# Patient Record
Sex: Female | Born: 1940 | Race: White | Hispanic: No | Smoking: Never smoker
Health system: Southern US, Community
[De-identification: ages and names within clinical notes are randomized; demographics above are authoritative.]

## PROBLEM LIST (undated history)

## (undated) DIAGNOSIS — F32A Depression, unspecified: Secondary | ICD-10-CM

## (undated) DIAGNOSIS — M1990 Unspecified osteoarthritis, unspecified site: Secondary | ICD-10-CM

## (undated) DIAGNOSIS — I509 Heart failure, unspecified: Secondary | ICD-10-CM

## (undated) DIAGNOSIS — F329 Major depressive disorder, single episode, unspecified: Secondary | ICD-10-CM

## (undated) DIAGNOSIS — R7881 Bacteremia: Secondary | ICD-10-CM

## (undated) DIAGNOSIS — N289 Disorder of kidney and ureter, unspecified: Secondary | ICD-10-CM

---

## 2009-12-25 ENCOUNTER — Ambulatory Visit: Payer: Self-pay | Admitting: Family Medicine

## 2010-10-08 ENCOUNTER — Inpatient Hospital Stay: Payer: Self-pay | Admitting: Internal Medicine

## 2016-04-30 ENCOUNTER — Ambulatory Visit (HOSPITAL_COMMUNITY)
Admission: AD | Admit: 2016-04-30 | Discharge: 2016-04-30 | Disposition: A | Discharge status: Home / Self Care | Payer: Medicare Other | Source: Other Acute Inpatient Hospital | Attending: Emergency Medicine | Admitting: Emergency Medicine

## 2016-04-30 ENCOUNTER — Emergency Department: Payer: Medicare Other

## 2016-04-30 ENCOUNTER — Encounter: Payer: Self-pay | Admitting: *Deleted

## 2016-04-30 ENCOUNTER — Emergency Department
Admission: EM | Admit: 2016-04-30 | Discharge: 2016-04-30 | Disposition: A | Discharge status: Other Acute Inpatient Hospital | Payer: Medicare Other | Attending: Emergency Medicine | Admitting: Emergency Medicine

## 2016-04-30 DIAGNOSIS — Z79899 Other long term (current) drug therapy: Secondary | ICD-10-CM | POA: Insufficient documentation

## 2016-04-30 DIAGNOSIS — I509 Heart failure, unspecified: Secondary | ICD-10-CM | POA: Insufficient documentation

## 2016-04-30 DIAGNOSIS — K222 Esophageal obstruction: Secondary | ICD-10-CM | POA: Insufficient documentation

## 2016-04-30 DIAGNOSIS — R1310 Dysphagia, unspecified: Secondary | ICD-10-CM | POA: Diagnosis present

## 2016-04-30 DIAGNOSIS — Z7982 Long term (current) use of aspirin: Secondary | ICD-10-CM | POA: Insufficient documentation

## 2016-04-30 HISTORY — DX: Depression, unspecified: F32.A

## 2016-04-30 HISTORY — DX: Unspecified osteoarthritis, unspecified site: M19.90

## 2016-04-30 HISTORY — DX: Disorder of kidney and ureter, unspecified: N28.9

## 2016-04-30 HISTORY — DX: Heart failure, unspecified: I50.9

## 2016-04-30 HISTORY — DX: Major depressive disorder, single episode, unspecified: F32.9

## 2016-04-30 LAB — CBC WITH DIFFERENTIAL/PLATELET
BASOS PCT: 1 %
Basophils Absolute: 0 10*3/uL (ref 0–0.1)
Eosinophils Absolute: 0 10*3/uL (ref 0–0.7)
Eosinophils Relative: 1 %
HCT: 42.1 % (ref 35.0–47.0)
HEMOGLOBIN: 14.5 g/dL (ref 12.0–16.0)
LYMPHS ABS: 0.6 10*3/uL — AB (ref 1.0–3.6)
LYMPHS PCT: 14 %
MCH: 30 pg (ref 26.0–34.0)
MCHC: 34.6 g/dL (ref 32.0–36.0)
MCV: 86.9 fL (ref 80.0–100.0)
MONO ABS: 0.6 10*3/uL (ref 0.2–0.9)
MONOS PCT: 14 %
NEUTROS ABS: 3.2 10*3/uL (ref 1.4–6.5)
NEUTROS PCT: 70 %
Platelets: 286 10*3/uL (ref 150–440)
RBC: 4.85 MIL/uL (ref 3.80–5.20)
RDW: 13.8 % (ref 11.5–14.5)
WBC: 4.6 10*3/uL (ref 3.6–11.0)

## 2016-04-30 LAB — COMPREHENSIVE METABOLIC PANEL
ALBUMIN: 4.3 g/dL (ref 3.5–5.0)
ALT: 11 U/L — ABNORMAL LOW (ref 14–54)
ANION GAP: 18 — AB (ref 5–15)
AST: 26 U/L (ref 15–41)
Alkaline Phosphatase: 62 U/L (ref 38–126)
BILIRUBIN TOTAL: 1.7 mg/dL — AB (ref 0.3–1.2)
BUN: 22 mg/dL — AB (ref 6–20)
CO2: 22 mmol/L (ref 22–32)
Calcium: 8.9 mg/dL (ref 8.9–10.3)
Chloride: 101 mmol/L (ref 101–111)
Creatinine, Ser: 0.84 mg/dL (ref 0.44–1.00)
GFR calc Af Amer: >60 mL/min (ref >60)
GFR calc non Af Amer: >60 mL/min (ref >60)
GLUCOSE: 95 mg/dL (ref 65–99)
POTASSIUM: 3.1 mmol/L — AB (ref 3.5–5.1)
SODIUM: 141 mmol/L (ref 135–145)
TOTAL PROTEIN: 8.9 g/dL — AB (ref 6.5–8.1)

## 2016-04-30 LAB — URINALYSIS, COMPLETE (UACMP) WITH MICROSCOPIC
BACTERIA UA: NONE SEEN
Bilirubin Urine: NEGATIVE
GLUCOSE, UA: NEGATIVE mg/dL
KETONES UR: 80 mg/dL — AB
Leukocytes, UA: NEGATIVE
Nitrite: NEGATIVE
PROTEIN: 30 mg/dL — AB
Specific Gravity, Urine: 1.02 (ref 1.005–1.030)
pH: 5 (ref 5.0–8.0)

## 2016-04-30 LAB — BETA-HYDROXYBUTYRIC ACID: BETA-HYDROXYBUTYRIC ACID: 6.55 mmol/L — AB (ref 0.05–0.27)

## 2016-04-30 LAB — LACTIC ACID, PLASMA: Lactic Acid, Venous: 1.5 mmol/L (ref 0.5–1.9)

## 2016-04-30 MED ORDER — SODIUM CHLORIDE 0.9 % IV BOLUS (SEPSIS)
1000.0000 mL | Freq: Once | INTRAVENOUS | Status: AC
  Administered 2016-04-30: 1000 mL via INTRAVENOUS

## 2016-04-30 NOTE — ED Notes (Signed)
Patient transported to X-ray 

## 2016-04-30 NOTE — ED Triage Notes (Signed)
Pt states she ate some popcorn on 1/10 and states she felt like it got stuck in her throat, states she was since seen at [facility] and given abx and fluids, states she continues to have trouble swallowing, states she has increased burping

## 2016-04-30 NOTE — ED Provider Notes (Signed)
[facility] Emergency Department Provider Note   ____________________________________________   First MD Initiated Contact with Patient 04/30/16 [id number]     (approximate)  I have reviewed the triage vital signs and the nursing notes.   HISTORY  Chief Complaint Dysphagia    HPI Rut Betterton is a 76 y.o. female who reports she has not cracker esophagus. She reports she would usually only has problems for 2 or 3 days couple times a year. This time however she says she has had difficulty swallowing since the 10th of the month. She has lost 9 pounds possibly 10 pounds was very weak and dehydrated and feels like she is weak wasting away. Patient says that this is her usual episode of protracted esophagus just that it has not resolved like it usually does.   Past Medical History:  Diagnosis Date  . Arthritis   . CHF (congestive heart failure) ([place])   . Depressed   . Renal disorder     There are no active problems to display for this patient.   History reviewed. No pertinent surgical history.  Prior to Admission medications   Medication Sig Start Date End Date Taking? Authorizing Provider  acetaminophen (TYLENOL) 500 MG tablet Take 500 mg by mouth 2 (two) times daily. 10/24/09  Yes Historical Provider, MD  aspirin EC 81 MG tablet Take 81 mg by mouth daily.   Yes Historical Provider, MD  atorvastatin (LIPITOR) 20 MG tablet Take 20 mg by mouth daily. 10/27/13  Yes Historical Provider, MD  cetirizine (ZYRTEC) 10 MG tablet Take 10 mg by mouth daily.   Yes Historical Provider, MD  Cholecalciferol (VITAMIN D-1000 MAX ST) 1000 units tablet Take 1,000 mg by mouth daily. 06/21/15  Yes Historical Provider, MD  conjugated estrogens (PREMARIN) vaginal cream Place 1 Applicatorful vaginally at bedtime. Do not use applicator. Apply pea sized application to the urethra each night before bedtime. 11/30/13  Yes Historical Provider, MD  Dextromethorphan-Guaifenesin (TUSSIN DM)  10-100 MG/5ML liquid Take 5 mLs by mouth every 6 (six) hours as needed.   Yes Historical Provider, MD  ferrous sulfate 325 (65 FE) MG tablet Take 325 mg by mouth 2 (two) times daily.   Yes Historical Provider, MD  guaiFENesin (MUCINEX) 600 MG 12 hr tablet Take 600 mg by mouth 2 (two) times daily as needed.   Yes Historical Provider, MD  HYDROcodone-acetaminophen (NORCO/VICODIN) 5-325 MG tablet Take 1 tablet by mouth 2 (two) times daily. 04/09/16  Yes Historical Provider, MD  hydroxychloroquine (PLAQUENIL) 200 MG tablet Take 200 mg by mouth daily. 01/18/16  Yes Historical Provider, MD  LORazepam (ATIVAN) 1 MG tablet Take 1 mg by mouth daily.   Yes Historical Provider, MD  mirtazapine (REMERON) 30 MG tablet Take 30 mg by mouth at bedtime.   Yes Historical Provider, MD  senna-docusate (SENOKOT-S) 8.6-50 MG tablet Take 1 tablet by mouth 2 (two) times daily.   Yes Historical Provider, MD  Suvorexant (BELSOMRA) 10 MG TABS Take 10 mg by mouth daily.   Yes Historical Provider, MD  triamcinolone cream (KENALOG) 0.1 % Apply 0.1 % topically 2 (two) times daily as needed.   Yes Historical Provider, MD    Allergies Patient has no known allergies.  No family history on file.  Social History Social History  Substance Use Topics  . Smoking status: Never Smoker  . Smokeless tobacco: Never Used  . Alcohol use No    Review of Systems Constitutional: No fever/chills Eyes: No visual changes. ENT: No  sore throat. Cardiovascular: Denies chest pain. Respiratory: Denies shortness of breath. Gastrointestinal: No abdominal pain.  No nausea, no vomiting.  No diarrhea.  No constipation. Genitourinary: Negative for dysuria. Musculoskeletal: Negative for back pain. Skin: Negative for rash. Neurological: Negative for headaches, focal weakness or numbness.  10-point ROS otherwise negative.  ____________________________________________   PHYSICAL EXAM:  VITAL SIGNS: ED Triage Vitals  Enc Vitals Group      BP 04/30/16 0908 127/72     Pulse Rate 04/30/16 0908 (!) 121     Resp 04/30/16 0908 18     Temp 04/30/16 0908 97.7 F (36.5 C)     Temp Source 04/30/16 0908 Oral     SpO2 04/30/16 0908 96 %     Weight 04/30/16 0909 110 lb (49.9 kg)     Height 04/30/16 0909 '4\' 10"'$  (1.473 m)     Head Circumference --      Peak Flow --      Pain Score --      Pain Loc --      Pain Edu? --      Excl. in [place]? --     Constitutional: Alert and oriented. Well appearing and in no acute distress. Eyes: Conjunctivae are normal. PERRL. EOMI. Head: Atraumatic. Nose: No congestion/rhinnorhea. Mouth/Throat: Mucous membranes are moist.  Oropharynx non-erythematous. Neck: No stridor.   Cardiovascular: Normal rate, regular rhythm. Grossly normal heart sounds.  Good peripheral circulation. Respiratory: Normal respiratory effort.  No retractions. Lungs CTAB. Gastrointestinal: Soft and nontender. No distention. No abdominal bruits. No CVA tenderness.   ____________________________________________   LABS (all labs ordered are listed, but only abnormal results are displayed)  Labs Reviewed  COMPREHENSIVE METABOLIC PANEL - Abnormal; Notable for the following:       Result Value   Potassium 3.1 (*)    BUN 22 (*)    Total Protein 8.9 (*)    ALT 11 (*)    Total Bilirubin 1.7 (*)    Anion gap 18 (*)    All other components within normal limits  CBC WITH DIFFERENTIAL/PLATELET - Abnormal; Notable for the following:    Lymphs Abs 0.6 (*)    All other components within normal limits  LACTIC ACID, PLASMA  URINALYSIS, COMPLETE (UACMP) WITH MICROSCOPIC  BETA-HYDROXYBUTYRIC ACID   ____________________________________________  EKG EKG read and interpreted by me shows sinus tachycardia rate of 101 normal axis nonspecific ST-T wave changes there is right bundle-branch block  ____________________________________________  RADIOLOGY  udy Result   CLINICAL DATA:  Productive cough.  EXAM: CHEST  2  VIEW  COMPARISON:  No recent prior.  FINDINGS: Mediastinum and hilar structures normal. Heart size normal. No focal infiltrate. Hyperexpansion of both lung fields consistent with COPD. Bilateral pleural-parenchymal thickening noted consistent with scarring. Bilateral nipple shadows noted. No pleural effusion or pneumothorax.  IMPRESSION: COPD. Bilateral pleural-parenchymal scarring. No acute abnormality.   Electronically Signed   By: Marcello Moores  Register   On: 04/30/2016 10:38    Study Result   CLINICAL DATA:  The patient reports eating popcorn on January 10th with if becoming stuck in her throat with persistent irritation. The patient has had difficulty swallowing anything solid for the past 6 days and has had vomiting. The patient reports 8 pound weight loss since beginning this. The patient is only able D pops ankles.  EXAM: ESOPHOGRAM/BARIUM SWALLOW  TECHNIQUE: Single contrast examination was performed using  thin barium  FLUOROSCOPY TIME:  Fluoroscopy Time:  0 minutes, 42 seconds  Radiation Exposure Index (  if provided by the fluoroscopic device): 497 micro Gy per meter squared  Number of Acquired Spot Images: 5+ 1 video loop  COMPARISON:  None in PACs  FINDINGS: The cervical and proximal and mid thoracic esophagus were normal in a survey fashion. A filling defect was noted in the distal esophagus just above a small hiatal hernia. A small amount of barium was able to pass around this filling defect. This filling defect did not appear to move significantly.  IMPRESSION: Filling defect in the distal esophagus which is obstructive to passage of more than small amounts of the thin barium. This may reflect a retained food particle or could reflect a pedunculated polyp either benign or malignant.  Direct visualization is recommended.   Electronically Signed   By: David  Martinique M.D.   On: 04/30/2016 11:40     ____________________________________________   PROCEDURES  Procedure(s) performed: Procedures  Critical Care performed:  Critical care time 45 minutes this includes discussing the patient with the radiologist before and after the barium swallow and attempting to transfer the patient several times because of the lack of beds. ____________________________________________   INITIAL IMPRESSION / ASSESSMENT AND PLAN / ED COURSE  Pertinent labs & imaging results that were available during my care of the patient were reviewed by me and considered in my medical decision making (see chart for details).  We do not have GI coverage. [facility] does not have any beds. [facility] does not have any beds either but will take her into the emergency room.      ____________________________________________   FINAL CLINICAL IMPRESSION(S) / ED DIAGNOSES  Final diagnoses:  Esophageal obstruction      NEW MEDICATIONS STARTED DURING THIS VISIT:  New Prescriptions   No medications on file     Note:  This document was prepared using Dragon voice recognition software and may include unintentional dictation errors.    Nena Polio, MD 04/30/16 1247

## 2017-05-12 ENCOUNTER — Ambulatory Visit
Admission: RE | Admit: 2017-05-12 | Discharge: 2017-05-12 | Disposition: A | Discharge status: Home / Self Care | Payer: Medicare Other | Source: Ambulatory Visit | Attending: Family Medicine | Admitting: Family Medicine

## 2017-05-12 ENCOUNTER — Ambulatory Visit: Admission: RE | Admit: 2017-05-12 | Payer: Medicare Other | Source: Ambulatory Visit | Admitting: *Deleted

## 2017-05-12 ENCOUNTER — Other Ambulatory Visit: Payer: Self-pay | Admitting: Family Medicine

## 2017-05-12 DIAGNOSIS — I700 Atherosclerosis of aorta: Secondary | ICD-10-CM | POA: Insufficient documentation

## 2017-05-12 DIAGNOSIS — R05 Cough: Secondary | ICD-10-CM

## 2017-05-12 DIAGNOSIS — R059 Cough, unspecified: Secondary | ICD-10-CM

## 2017-05-12 DIAGNOSIS — M8008XA Age-related osteoporosis with current pathological fracture, vertebra(e), initial encounter for fracture: Secondary | ICD-10-CM | POA: Diagnosis not present

## 2017-05-12 DIAGNOSIS — E871 Hypo-osmolality and hyponatremia: Secondary | ICD-10-CM | POA: Diagnosis not present

## 2017-05-13 ENCOUNTER — Encounter: Payer: Self-pay | Admitting: Emergency Medicine

## 2017-05-13 ENCOUNTER — Emergency Department: Payer: Medicare Other

## 2017-05-13 ENCOUNTER — Inpatient Hospital Stay
Admission: EM | Admit: 2017-05-13 | Discharge: 2017-05-15 | DRG: 543 | Disposition: A | Discharge status: Home Health | Payer: Medicare Other | Attending: Internal Medicine | Admitting: Internal Medicine

## 2017-05-13 ENCOUNTER — Other Ambulatory Visit: Payer: Self-pay

## 2017-05-13 DIAGNOSIS — E785 Hyperlipidemia, unspecified: Secondary | ICD-10-CM | POA: Diagnosis present

## 2017-05-13 DIAGNOSIS — J45901 Unspecified asthma with (acute) exacerbation: Secondary | ICD-10-CM | POA: Diagnosis present

## 2017-05-13 DIAGNOSIS — E871 Hypo-osmolality and hyponatremia: Secondary | ICD-10-CM | POA: Diagnosis present

## 2017-05-13 DIAGNOSIS — F0390 Unspecified dementia without behavioral disturbance: Secondary | ICD-10-CM | POA: Diagnosis present

## 2017-05-13 DIAGNOSIS — E876 Hypokalemia: Secondary | ICD-10-CM | POA: Diagnosis present

## 2017-05-13 DIAGNOSIS — S42031A Displaced fracture of lateral end of right clavicle, initial encounter for closed fracture: Secondary | ICD-10-CM | POA: Diagnosis present

## 2017-05-13 DIAGNOSIS — F329 Major depressive disorder, single episode, unspecified: Secondary | ICD-10-CM | POA: Diagnosis present

## 2017-05-13 DIAGNOSIS — J101 Influenza due to other identified influenza virus with other respiratory manifestations: Secondary | ICD-10-CM | POA: Diagnosis present

## 2017-05-13 DIAGNOSIS — M069 Rheumatoid arthritis, unspecified: Secondary | ICD-10-CM | POA: Diagnosis present

## 2017-05-13 DIAGNOSIS — J40 Bronchitis, not specified as acute or chronic: Secondary | ICD-10-CM

## 2017-05-13 DIAGNOSIS — M8008XA Age-related osteoporosis with current pathological fracture, vertebra(e), initial encounter for fracture: Secondary | ICD-10-CM | POA: Diagnosis present

## 2017-05-13 DIAGNOSIS — Z79899 Other long term (current) drug therapy: Secondary | ICD-10-CM

## 2017-05-13 DIAGNOSIS — W1830XA Fall on same level, unspecified, initial encounter: Secondary | ICD-10-CM | POA: Diagnosis present

## 2017-05-13 DIAGNOSIS — S32019A Unspecified fracture of first lumbar vertebra, initial encounter for closed fracture: Secondary | ICD-10-CM | POA: Diagnosis present

## 2017-05-13 DIAGNOSIS — S32010A Wedge compression fracture of first lumbar vertebra, initial encounter for closed fracture: Secondary | ICD-10-CM

## 2017-05-13 DIAGNOSIS — E878 Other disorders of electrolyte and fluid balance, not elsewhere classified: Secondary | ICD-10-CM | POA: Diagnosis present

## 2017-05-13 DIAGNOSIS — I509 Heart failure, unspecified: Secondary | ICD-10-CM | POA: Diagnosis present

## 2017-05-13 DIAGNOSIS — Z7951 Long term (current) use of inhaled steroids: Secondary | ICD-10-CM

## 2017-05-13 DIAGNOSIS — Z7982 Long term (current) use of aspirin: Secondary | ICD-10-CM | POA: Diagnosis not present

## 2017-05-13 DIAGNOSIS — S42001A Fracture of unspecified part of right clavicle, initial encounter for closed fracture: Secondary | ICD-10-CM

## 2017-05-13 LAB — CBC WITH DIFFERENTIAL/PLATELET
Basophils Absolute: 0 10*3/uL (ref 0–0.1)
Basophils Relative: 0 %
EOS PCT: 0 %
Eosinophils Absolute: 0 10*3/uL (ref 0–0.7)
HEMATOCRIT: 41.4 % (ref 35.0–47.0)
Hemoglobin: 14 g/dL (ref 12.0–16.0)
LYMPHS ABS: 0.4 10*3/uL — AB (ref 1.0–3.6)
LYMPHS PCT: 5 %
MCH: 30.3 pg (ref 26.0–34.0)
MCHC: 33.9 g/dL (ref 32.0–36.0)
MCV: 89.5 fL (ref 80.0–100.0)
Monocytes Absolute: 0.9 10*3/uL (ref 0.2–0.9)
Monocytes Relative: 10 %
Neutro Abs: 7.8 10*3/uL — ABNORMAL HIGH (ref 1.4–6.5)
Neutrophils Relative %: 85 %
PLATELETS: 139 10*3/uL — AB (ref 150–440)
RBC: 4.63 MIL/uL (ref 3.80–5.20)
RDW: 13.4 % (ref 11.5–14.5)
WBC: 9.2 10*3/uL (ref 3.6–11.0)

## 2017-05-13 LAB — COMPREHENSIVE METABOLIC PANEL
ALK PHOS: 65 U/L (ref 38–126)
ALT: 18 U/L (ref 14–54)
AST: 45 U/L — ABNORMAL HIGH (ref 15–41)
Albumin: 3.8 g/dL (ref 3.5–5.0)
Anion gap: 13 (ref 5–15)
BUN: 12 mg/dL (ref 6–20)
CALCIUM: 8.8 mg/dL — AB (ref 8.9–10.3)
CHLORIDE: 86 mmol/L — AB (ref 101–111)
CO2: 25 mmol/L (ref 22–32)
CREATININE: 0.74 mg/dL (ref 0.44–1.00)
GFR calc non Af Amer: >60 mL/min (ref >60)
Glucose, Bld: 150 mg/dL — ABNORMAL HIGH (ref 65–99)
Potassium: 3 mmol/L — ABNORMAL LOW (ref 3.5–5.1)
Sodium: 124 mmol/L — ABNORMAL LOW (ref 135–145)
TOTAL PROTEIN: 7.9 g/dL (ref 6.5–8.1)
Total Bilirubin: 1 mg/dL (ref 0.3–1.2)

## 2017-05-13 LAB — MRSA PCR SCREENING: MRSA by PCR: POSITIVE — AB

## 2017-05-13 LAB — TSH: TSH: 0.533 u[IU]/mL (ref 0.350–4.500)

## 2017-05-13 LAB — INFLUENZA PANEL BY PCR (TYPE A & B)
INFLAPCR: POSITIVE — AB
INFLBPCR: NEGATIVE

## 2017-05-13 LAB — TROPONIN I

## 2017-05-13 MED ORDER — VITAMIN D 1000 UNITS PO TABS
1000.0000 [IU] | ORAL_TABLET | Freq: Every day | ORAL | Status: DC
  Administered 2017-05-14 – 2017-05-15 (×2): 1000 [IU] via ORAL
  Filled 2017-05-13 (×2): qty 1

## 2017-05-13 MED ORDER — IPRATROPIUM-ALBUTEROL 0.5-2.5 (3) MG/3ML IN SOLN
RESPIRATORY_TRACT | Status: AC
  Filled 2017-05-13: qty 6

## 2017-05-13 MED ORDER — BISACODYL 10 MG RE SUPP
10.0000 mg | Freq: Every day | RECTAL | Status: DC | PRN
  Administered 2017-05-15: 10 mg via RECTAL
  Filled 2017-05-13: qty 1

## 2017-05-13 MED ORDER — LORATADINE 10 MG PO TABS
10.0000 mg | ORAL_TABLET | Freq: Every day | ORAL | Status: DC
Start: 2017-05-14 — End: 2017-05-15
  Administered 2017-05-14 – 2017-05-15 (×2): 10 mg via ORAL
  Filled 2017-05-13 (×2): qty 1

## 2017-05-13 MED ORDER — IPRATROPIUM-ALBUTEROL 0.5-2.5 (3) MG/3ML IN SOLN
9.0000 mL | Freq: Once | RESPIRATORY_TRACT | Status: AC
  Administered 2017-05-13: 9 mL via RESPIRATORY_TRACT
  Filled 2017-05-13: qty 3

## 2017-05-13 MED ORDER — METHYLPREDNISOLONE SODIUM SUCC 40 MG IJ SOLR
40.0000 mg | Freq: Four times a day (QID) | INTRAMUSCULAR | Status: DC
  Administered 2017-05-13 – 2017-05-14 (×2): 40 mg via INTRAVENOUS
  Filled 2017-05-13 (×2): qty 1

## 2017-05-13 MED ORDER — MELATONIN 5 MG PO TABS
5.0000 mg | ORAL_TABLET | Freq: Every day | ORAL | Status: DC
  Administered 2017-05-13 – 2017-05-14 (×2): 5 mg via ORAL
  Filled 2017-05-13 (×3): qty 1

## 2017-05-13 MED ORDER — GUAIFENESIN ER 600 MG PO TB12
600.0000 mg | ORAL_TABLET | Freq: Two times a day (BID) | ORAL | Status: DC
  Administered 2017-05-13 – 2017-05-15 (×4): 600 mg via ORAL
  Filled 2017-05-13 (×4): qty 1

## 2017-05-13 MED ORDER — ACETAMINOPHEN 325 MG PO TABS: 650.0000 mg | ORAL_TABLET | Freq: Four times a day (QID) | ORAL | Status: DC | PRN

## 2017-05-13 MED ORDER — FLEET ENEMA 7-19 GM/118ML RE ENEM: 1.0000 | ENEMA | Freq: Once | RECTAL | Status: DC | PRN

## 2017-05-13 MED ORDER — ATORVASTATIN CALCIUM 20 MG PO TABS
20.0000 mg | ORAL_TABLET | Freq: Every day | ORAL | Status: DC
  Administered 2017-05-14 – 2017-05-15 (×2): 20 mg via ORAL
  Filled 2017-05-13 (×4): qty 1

## 2017-05-13 MED ORDER — BUDESONIDE 0.5 MG/2ML IN SUSP
2.0000 mL | Freq: Every day | RESPIRATORY_TRACT | Status: DC
  Administered 2017-05-14 – 2017-05-15 (×2): 0.5 mg via RESPIRATORY_TRACT
  Filled 2017-05-13 (×2): qty 2

## 2017-05-13 MED ORDER — OSELTAMIVIR PHOSPHATE 30 MG PO CAPS
30.0000 mg | ORAL_CAPSULE | Freq: Two times a day (BID) | ORAL | Status: DC
  Administered 2017-05-13 – 2017-05-15 (×4): 30 mg via ORAL
  Filled 2017-05-13 (×5): qty 1

## 2017-05-13 MED ORDER — FERROUS SULFATE 325 (65 FE) MG PO TABS
325.0000 mg | ORAL_TABLET | Freq: Two times a day (BID) | ORAL | Status: DC
  Administered 2017-05-13 – 2017-05-15 (×4): 325 mg via ORAL
  Filled 2017-05-13 (×4): qty 1

## 2017-05-13 MED ORDER — SODIUM CHLORIDE 0.9 % IV BOLUS (SEPSIS)
500.0000 mL | Freq: Once | INTRAVENOUS | Status: AC
  Administered 2017-05-13: 500 mL via INTRAVENOUS

## 2017-05-13 MED ORDER — HYDROXYCHLOROQUINE SULFATE 200 MG PO TABS
400.0000 mg | ORAL_TABLET | Freq: Every day | ORAL | Status: DC
  Administered 2017-05-14 – 2017-05-15 (×2): 400 mg via ORAL
  Filled 2017-05-13 (×2): qty 2

## 2017-05-13 MED ORDER — KCL IN DEXTROSE-NACL 20-5-0.9 MEQ/L-%-% IV SOLN
INTRAVENOUS | Status: DC
  Administered 2017-05-13 – 2017-05-14 (×2): via INTRAVENOUS
  Filled 2017-05-13 (×4): qty 1000

## 2017-05-13 MED ORDER — METHYLPREDNISOLONE SODIUM SUCC 125 MG IJ SOLR
125.0000 mg | Freq: Once | INTRAMUSCULAR | Status: AC
  Administered 2017-05-13: 125 mg via INTRAVENOUS
  Filled 2017-05-13: qty 2

## 2017-05-13 MED ORDER — SUVOREXANT 10 MG PO TABS: 15.0000 mg | ORAL_TABLET | Freq: Every day | ORAL | Status: DC

## 2017-05-13 MED ORDER — TRIAMCINOLONE ACETONIDE 0.1 % EX CREA
TOPICAL_CREAM | Freq: Two times a day (BID) | CUTANEOUS | Status: DC | PRN
  Filled 2017-05-13: qty 15

## 2017-05-13 MED ORDER — ALBUTEROL SULFATE (2.5 MG/3ML) 0.083% IN NEBU: 2.5000 mg | INHALATION_SOLUTION | RESPIRATORY_TRACT | Status: DC | PRN

## 2017-05-13 MED ORDER — HYDROCODONE-ACETAMINOPHEN 5-325 MG PO TABS
1.0000 | ORAL_TABLET | ORAL | Status: DC | PRN
  Administered 2017-05-13 – 2017-05-15 (×5): 1 via ORAL
  Filled 2017-05-13 (×6): qty 1

## 2017-05-13 MED ORDER — ONDANSETRON HCL 4 MG/2ML IJ SOLN: 4.0000 mg | Freq: Four times a day (QID) | INTRAMUSCULAR | Status: DC | PRN

## 2017-05-13 MED ORDER — PANTOPRAZOLE SODIUM 40 MG PO TBEC
40.0000 mg | DELAYED_RELEASE_TABLET | Freq: Every day | ORAL | Status: DC
  Administered 2017-05-14 – 2017-05-15 (×2): 40 mg via ORAL
  Filled 2017-05-13 (×2): qty 1

## 2017-05-13 MED ORDER — ASPIRIN EC 81 MG PO TBEC
81.0000 mg | DELAYED_RELEASE_TABLET | Freq: Every day | ORAL | Status: DC
  Administered 2017-05-14 – 2017-05-15 (×2): 81 mg via ORAL
  Filled 2017-05-13 (×2): qty 1

## 2017-05-13 MED ORDER — ACETAMINOPHEN 650 MG RE SUPP: 650.0000 mg | Freq: Four times a day (QID) | RECTAL | Status: DC | PRN

## 2017-05-13 MED ORDER — MORPHINE SULFATE (PF) 2 MG/ML IV SOLN
2.0000 mg | INTRAVENOUS | Status: DC | PRN
  Administered 2017-05-14: 2 mg via INTRAVENOUS
  Filled 2017-05-13: qty 1

## 2017-05-13 MED ORDER — POTASSIUM CHLORIDE 20 MEQ PO PACK
20.0000 meq | PACK | ORAL | Status: AC
  Administered 2017-05-13 (×2): 20 meq via ORAL
  Filled 2017-05-13 (×2): qty 1

## 2017-05-13 MED ORDER — MIRTAZAPINE 15 MG PO TABS
30.0000 mg | ORAL_TABLET | Freq: Every day | ORAL | Status: DC
  Administered 2017-05-13 – 2017-05-14 (×2): 30 mg via ORAL
  Filled 2017-05-13 (×2): qty 2

## 2017-05-13 MED ORDER — POLYETHYLENE GLYCOL 3350 17 G PO PACK: 17.0000 g | PACK | Freq: Every day | ORAL | Status: DC | PRN

## 2017-05-13 MED ORDER — ENOXAPARIN SODIUM 40 MG/0.4ML ~~LOC~~ SOLN
40.0000 mg | SUBCUTANEOUS | Status: DC
  Administered 2017-05-13 – 2017-05-14 (×2): 40 mg via SUBCUTANEOUS
  Filled 2017-05-13 (×2): qty 0.4

## 2017-05-13 MED ORDER — IPRATROPIUM-ALBUTEROL 0.5-2.5 (3) MG/3ML IN SOLN
3.0000 mL | Freq: Four times a day (QID) | RESPIRATORY_TRACT | Status: DC
  Administered 2017-05-14 – 2017-05-15 (×7): 3 mL via RESPIRATORY_TRACT
  Filled 2017-05-13 (×7): qty 3

## 2017-05-13 MED ORDER — AZITHROMYCIN 500 MG IV SOLR
500.0000 mg | Freq: Once | INTRAVENOUS | Status: AC
  Administered 2017-05-13: 500 mg via INTRAVENOUS
  Filled 2017-05-13: qty 500

## 2017-05-13 MED ORDER — DOCUSATE SODIUM 100 MG PO CAPS
100.0000 mg | ORAL_CAPSULE | Freq: Two times a day (BID) | ORAL | Status: DC
  Administered 2017-05-13 – 2017-05-15 (×4): 100 mg via ORAL
  Filled 2017-05-13 (×4): qty 1

## 2017-05-13 MED ORDER — METHYLPREDNISOLONE SODIUM SUCC 125 MG IJ SOLR: 125.0000 mg | Freq: Once | INTRAMUSCULAR | Status: DC

## 2017-05-13 MED ORDER — AZITHROMYCIN 250 MG PO TABS: 250.0000 mg | ORAL_TABLET | Freq: Every day | ORAL | Status: DC

## 2017-05-13 MED ORDER — ONDANSETRON HCL 4 MG PO TABS: 4.0000 mg | ORAL_TABLET | Freq: Four times a day (QID) | ORAL | Status: DC | PRN

## 2017-05-13 MED ORDER — SENNOSIDES-DOCUSATE SODIUM 8.6-50 MG PO TABS
1.0000 | ORAL_TABLET | Freq: Two times a day (BID) | ORAL | Status: DC
  Administered 2017-05-13 – 2017-05-15 (×4): 1 via ORAL
  Filled 2017-05-13 (×4): qty 1

## 2017-05-13 MED ORDER — LORAZEPAM 1 MG PO TABS
1.0000 mg | ORAL_TABLET | Freq: Every day | ORAL | Status: DC
  Administered 2017-05-13 – 2017-05-14 (×2): 1 mg via ORAL
  Filled 2017-05-13 (×2): qty 1

## 2017-05-13 MED ORDER — HYDROCODONE-ACETAMINOPHEN 5-325 MG PO TABS: 1.0000 | ORAL_TABLET | Freq: Two times a day (BID) | ORAL | Status: DC

## 2017-05-13 NOTE — Progress Notes (Signed)
Patient is admitted to room 153 from ED. A&O x 4, but HOH. No hearing aids. Able to transfer with assist x 2. Sling to right arm in place. Broken blister to right buttocks. Able to answer questions approp. Bed alarm on for safety. Flu and MRSA screens completed. Drop isolation initiated.  IV fluids and pain med given.

## 2017-05-13 NOTE — ED Triage Notes (Signed)
Pt reports has had a wet cough for awhile now. Denies fevers, SOB, other respiratory issues just the cough. Pt also reports that she fell yesterday when she lost her balance getting something out of her closet and hurt her lower back and right shoulder. Pt with range of motion that is normal per caregiver but caregiver reports patients is slower now due to pain;

## 2017-05-13 NOTE — Progress Notes (Signed)
Patient tested positive for flu A+. Notified MD, new orders placed. Patient already on droplet precautions already.

## 2017-05-13 NOTE — Consult Note (Signed)
Referring Physician:  No referring provider defined for this encounter.  Primary Physician:  Remi Haggard, FNP  Chief Complaint:  fall  History of Present Illness: 05/13/2017 Linda Walsh is a 77 y.o. female who presents with the chief complaint of a fall yesterday.  She is also suffering from a cough.  She has had a R clavicular fracture identified. During workup, an L1 fracture was noted.  This prompted neurosurgical consultation.  Linda Walsh reports a mechanical fall to the sitting position yesterday.  She struck her head but had no LOC.  She denies any weakness or change in sensation.  She does report some midline back pain around the middle of her back, but no clear radicular pain.  She is otherwise at her baseline state of health.   The symptoms are causing a significant impact on the patient's life.   Review of Systems:  A 10 point review of systems is negative, except for the pertinent positives and negatives detailed in the HPI.  Past Medical History: Past Medical History:  Diagnosis Date  . Arthritis   . CHF (congestive heart failure) ([place])   . Depressed   . Renal disorder     Past Surgical History: History reviewed. No pertinent surgical history.  Allergies: Allergies as of 05/13/2017  . (No Known Allergies)    Medications:  Current Facility-Administered Medications:  .  acetaminophen (TYLENOL) tablet 650 mg, 650 mg, Oral, Q6H PRN **OR** acetaminophen (TYLENOL) suppository 650 mg, 650 mg, Rectal, Q6H PRN, Salary, Montell D, MD .  albuterol (PROVENTIL) (2.5 MG/3ML) 0.083% nebulizer solution 2.5 mg, 2.5 mg, Nebulization, Q2H PRN, Salary, Montell D, MD .  Derrill Memo ON 05/14/2017] aspirin EC tablet 81 mg, 81 mg, Oral, Daily, Salary, Montell D, MD .  Derrill Memo ON 05/14/2017] atorvastatin (LIPITOR) tablet 20 mg, 20 mg, Oral, Daily, Salary, Montell D, MD .  Derrill Memo ON 05/14/2017] azithromycin (ZITHROMAX) tablet 250 mg, 250 mg, Oral, Daily, Salary, Montell D, MD .  bisacodyl  (DULCOLAX) suppository 10 mg, 10 mg, Rectal, Daily PRN, Salary, Montell D, MD .  budesonide (PULMICORT) nebulizer solution 0.5 mg, 2 mL, Nebulization, Daily, Salary, Montell D, MD .  Derrill Memo ON 05/14/2017] cholecalciferol (VITAMIN D) tablet 1,000 Units, 1,000 Units, Oral, Daily, Salary, Montell D, MD .  dextrose 5 % and 0.9 % NaCl with KCl 20 mEq/L infusion, , Intravenous, Continuous, Salary, Montell D, MD, Last Rate: 100 mL/hr at 05/13/17 1635 .  docusate sodium (COLACE) capsule 100 mg, 100 mg, Oral, BID, Salary, Montell D, MD .  enoxaparin (LOVENOX) injection 40 mg, 40 mg, Subcutaneous, Q24H, Salary, Montell D, MD .  ferrous sulfate tablet 325 mg, 325 mg, Oral, BID, Salary, Montell D, MD .  guaiFENesin (MUCINEX) 12 hr tablet 600 mg, 600 mg, Oral, BID, Salary, Montell D, MD .  HYDROcodone-acetaminophen (NORCO/VICODIN) 5-325 MG per tablet 1 tablet, 1 tablet, Oral, Q4H PRN, Poggi, Marshall Cork, MD, 1 tablet at 05/13/17 1633 .  [START ON 05/14/2017] hydroxychloroquine (PLAQUENIL) tablet 400 mg, 400 mg, Oral, Daily, Salary, Montell D, MD .  ipratropium-albuterol (DUONEB) 0.5-2.5 (3) MG/3ML nebulizer solution 3 mL, 3 mL, Nebulization, QID, Salary, Montell D, MD .  ipratropium-albuterol (DUONEB) 0.5-2.5 (3) MG/3ML nebulizer solution, , , ,  .  [START ON 05/14/2017] loratadine (CLARITIN) tablet 10 mg, 10 mg, Oral, Daily, Salary, Montell D, MD .  LORazepam (ATIVAN) tablet 1 mg, 1 mg, Oral, QHS, Salary, Montell D, MD .  Melatonin TABS 5 mg, 5 mg, Oral, QHS, Salary, Montell D,  MD .  methylPREDNISolone sodium succinate (SOLU-MEDROL) 125 mg/2 mL injection 125 mg, 125 mg, Intravenous, Once, Salary, Montell D, MD .  methylPREDNISolone sodium succinate (SOLU-MEDROL) 40 mg/mL injection 40 mg, 40 mg, Intravenous, Q6H, Salary, Montell D, MD .  mirtazapine (REMERON) tablet 30 mg, 30 mg, Oral, QHS, Salary, Montell D, MD .  morphine 2 MG/ML injection 2 mg, 2 mg, Intravenous, Q2H PRN, Salary, Montell D, MD .  ondansetron (ZOFRAN)  tablet 4 mg, 4 mg, Oral, Q6H PRN **OR** ondansetron (ZOFRAN) injection 4 mg, 4 mg, Intravenous, Q6H PRN, Salary, Montell D, MD .  Derrill Memo ON 05/14/2017] pantoprazole (PROTONIX) EC tablet 40 mg, 40 mg, Oral, Daily, Salary, Montell D, MD .  polyethylene glycol (MIRALAX / GLYCOLAX) packet 17 g, 17 g, Oral, Daily PRN, Salary, Montell D, MD .  senna-docusate (Senokot-S) tablet 1 tablet, 1 tablet, Oral, BID, Salary, Montell D, MD .  sodium phosphate (FLEET) 7-19 GM/118ML enema 1 enema, 1 enema, Rectal, Once PRN, Salary, Montell D, MD .  triamcinolone cream (KENALOG) 0.1 %, , Topical, BID PRN, Salary, Avel Peace, MD   Social History: Social History   Tobacco Use  . Smoking status: Never Smoker  . Smokeless tobacco: Never Used  Substance Use Topics  . Alcohol use: No  . Drug use: Not on file    Family Medical History: History reviewed. No pertinent family history.  Physical Examination: Vitals:   05/13/17 1349 05/13/17 1611  BP:  137/66  Pulse: (!) 110 68  Resp:  (!) 27  Temp:  98.2 F (36.8 C)  SpO2: 99% 100%     General: Patient is well developed, well nourished, calm, collected, and in no apparent distress.  Psychiatric: Patient is non-anxious.  Head:  Pupils equal, round, and reactive to light.  ENT:  Oral mucosa appears well hydrated.  Neck:   Supple.  Full range of motion.  Respiratory: Patient is breathing without any difficulty.  Extremities: No edema. R arm in a sling.   Vascular: Palpable pulses in dorsal pedal vessels.  Skin:   On exposed skin, there are no abnormal skin lesions.  NEUROLOGICAL:  General: In no acute distress.   Awake, alert, oriented to person, place, and time.  Pupils equal round and reactive to light.  Facial tone is symmetric.  Tongue protrusion is midline.    ROM of spine: full.  Palpation of spine: tender at TL junction.    Strength: Side Biceps Triceps Deltoid Interossei Grip Wrist Ext. Wrist Flex.  R - - - 5 5 5 5   L 5 5 5 5 5 5 5     Side Iliopsoas Quads Hamstring PF DF EHL  R 5 5 5 5 5 5   L 5 5 5 5 5 5    Reflexes are 1+ and symmetric at the biceps, triceps, brachioradialis, patella and achilles.   Bilateral upper and lower extremity sensation is intact to light touch and pin prick.  Clonus is not present.  Toes are down-going.  Gait is untested.  Hoffman's is absent.  She has R arm in sling that limits evaluation due to R clavicular fracture.  Imaging: CT L spine 05/13/17 IMPRESSION: 1. Acute L1 vertebral body compression fracture with approximately 50% central height loss.   Electronically Signed   By: Kathreen Devoid   On: 05/13/2017 13:28  DEXA in 2016 shows severe osteoporosis with T score of -5  I have personally reviewed the images and agree with the above interpretation.  Assessment and Plan: Linda Walsh  is a pleasant 77 y.o. female with L1 osteoporotic fracture most consistent with compression fracture.  She has severe osteoporosis, and is not a surgical candidate due to this.  I would recommend TLSO from [place].  She may need kyphoplasty in the future, though she would likely not tolerate the procedure currently with her cough and oxygen requirement.   Continue pain medication for pain control.  No injections are indicated.  After brace is placed, she can ambulate with PTOT and have placement arranged.  I will arrange follow-up for her for management of her compression fracture.  Rasa Degrazia K. Izora Ribas MD, [place] [facility] of [facility]

## 2017-05-13 NOTE — Consult Note (Signed)
ORTHOPAEDIC CONSULTATION  REQUESTING PHYSICIAN: Salary, Avel Peace, MD  Chief Complaint:   Right shoulder pain.  History of Present Illness: Linda Walsh is a 77 y.o. female with multiple medical problems including depression, congestive heart failure, and significant arthritis who lives in a group home with 5 or 6 other women.  Apparently, she lost her balance and fell while in her home yesterday, landing on her right shoulder.  She did not seek treatment immediately.  However, because of continued pain, she was brought to the emergency room today and subsequently admitted for pain control as well as for treatment of her respiratory illness.  The patient also notes some lower back pain, but is being evaluated by the neurosurgeon for this injury.  The patient denies striking her head or losing consciousness.  She does note that she was feeling some "weakness" due to an upper respiratory illness which may have precipitated her fall.  She also denies any chest pain or lightheadedness which may have contributed to her fall.  Past Medical History:  Diagnosis Date  . Arthritis   . CHF (congestive heart failure) ([place])   . Depressed   . Renal disorder    History reviewed. No pertinent surgical history. Social History   Socioeconomic History  . Marital status: Widowed    Spouse name: None  . Number of children: None  . Years of education: None  . Highest education level: None  Social Needs  . Financial resource strain: None  . Food insecurity - worry: None  . Food insecurity - inability: None  . Transportation needs - medical: None  . Transportation needs - non-medical: None  Occupational History  . None  Tobacco Use  . Smoking status: Never Smoker  . Smokeless tobacco: Never Used  Substance and Sexual Activity  . Alcohol use: No  . Drug use: None  . Sexual activity: None  Other Topics Concern  . None  Social History  Narrative  . None   History reviewed. No pertinent family history. No Known Allergies Prior to Admission medications   Medication Sig Start Date End Date Taking? Authorizing Provider  acetaminophen (TYLENOL) 500 MG tablet Take 500 mg by mouth 2 (two) times daily. 10/24/09  Yes [provider]  aspirin EC 81 MG tablet Take 81 mg by mouth daily.   Yes [provider]  atorvastatin (LIPITOR) 20 MG tablet Take 20 mg by mouth daily. 10/27/13  Yes [provider]  azithromycin (ZITHROMAX) 250 MG tablet Take 1 tablet by mouth daily. 05/12/17  Yes [provider]  budesonide (PULMICORT) 0.5 MG/2ML nebulizer solution Take 2 mLs by nebulization daily. 09/22/16  Yes [provider]  cetirizine (ZYRTEC) 10 MG tablet Take 10 mg by mouth daily.   Yes [provider]  Cholecalciferol (VITAMIN D-1000 MAX ST) 1000 units tablet Take 1,000 mg by mouth daily. 06/21/15  Yes [provider]  ferrous sulfate 325 (65 FE) MG tablet Take 325 mg by mouth 2 (two) times daily.   Yes [provider]  HYDROcodone-acetaminophen (NORCO/VICODIN) 5-325 MG tablet Take 1 tablet by mouth 2 (two) times daily. 04/09/16  Yes [provider]  hydroxychloroquine (PLAQUENIL) 200 MG tablet Take 400 mg by mouth daily.  01/18/16  Yes [provider]  Ipratropium-Albuterol (COMBIVENT RESPIMAT IN) Inhale 1 puff into the lungs every 4 (four) hours as needed.   Yes [provider]  LORazepam (ATIVAN) 1 MG tablet Take 1 mg by mouth at bedtime.    Yes  [provider]  mirtazapine (REMERON) 30 MG tablet Take 30 mg by mouth at bedtime.   Yes [provider]  pantoprazole (PROTONIX) 40 MG tablet Take 1 tablet by mouth daily. 05/12/17  Yes [provider]  senna-docusate (SENOKOT-S) 8.6-50 MG tablet Take 1 tablet by mouth 2 (two) times daily.   Yes [provider]  Suvorexant (BELSOMRA) 10 MG TABS Take 15 mg by mouth at  bedtime.    Yes [provider]  PROAIR HFA 108 (90 Base) MCG/ACT inhaler Inhale 2 puffs into the lungs every 4 (four) hours as needed. 05/12/17   [provider]  triamcinolone cream (KENALOG) 0.1 % Apply 0.1 % topically 2 (two) times daily as needed.    [provider]   Dg Chest 2 View  Result Date: 05/13/2017 CLINICAL DATA:  Cough, recent fall EXAM: CHEST  2 VIEW COMPARISON:  Chest x-ray of 05/12/2017 FINDINGS: The lungs remain somewhat hyperaerated but clear. No pneumonia or effusion is seen. Mediastinal hilar contours are unremarkable and the heart is within normal limits in size. The bones are osteopenic. There are diffuse degenerative changes in the shoulders. The right distal clavicle fracture with separation of the right AC joint is not as well seen by chest x-ray. IMPRESSION: 1. No active lung disease.  Slight hyper aeration. 2. The fracture of the distal right clavicle with dislocation of the right AC joint noted on right shoulder films is not as well seen by chest x-ray. 3. Degenerative changes of the shoulders. Electronically Signed   By: Ivar Drape M.D.   On: 05/13/2017 10:39   Dg Chest 2 View  Result Date: 05/13/2017 CLINICAL DATA:  77 year old female history of cough for 1 month. EXAM: CHEST  2 VIEW COMPARISON:  Chest x-ray 04/30/2016. FINDINGS: Lung volumes are normal. No consolidative airspace disease. No pleural effusions. No pneumothorax. No pulmonary nodule or mass noted. Pulmonary vasculature and the cardiomediastinal silhouette are within normal limits. Atherosclerosis in the thoracic aorta. IMPRESSION: 1.  No radiographic evidence of acute cardiopulmonary disease. 2. Aortic atherosclerosis. Electronically Signed   By: Vinnie Langton M.D.   On: 05/13/2017 08:47   Dg Lumbar Spine Complete  Result Date: 05/13/2017 CLINICAL DATA:  Cough, fell yesterday with some low back pain EXAM: LUMBAR SPINE - COMPLETE 4+ VIEW COMPARISON:  None. FINDINGS: The lumbar  vertebrae are slightly straightened in alignment. There is what appears to be and acute compression deformity of L1 vertebral body of approximately 40%. There may be slight retropulsion at the level of the compression deformity. The bones are osteopenic. There is degenerative disc disease at L4-5 with some loss of disc space. The SI joints are corticated. IMPRESSION: 1. Probable acute compression deformity of L1 vertebral body of approximately 40% with some retropulsion. 2. Diffuse osteopenia. 3. 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 Electronically Signed   By: Ivar Drape M.D.   On: 05/13/2017 10:26   Dg Pelvis 1-2 Views  Result Date: 05/13/2017 CLINICAL DATA:  [place] yesterday now with low back pain EXAM: PELVIS - 1-2 VIEW COMPARISON:  None FINDINGS: There is mild degenerative joint disease with some loss of joint space bilaterally. No hip fracture is seen. There appears to be and old left inferior pubic ramus fracture but clinical correlation is recommended. Also there is a faint lucency through the right pubic ramus most likely overlapping, but a subtle fracture cannot be excluded. The pubic ramus is in normal position. The SI joints are corticated. IMPRESSION: 1. Probable old  fracture of the left inferior pelvic ramus. Correlate clinically. 2. Vague lucency through the right pubic ramus may be due to overlapping structures, but correlate clinically or consider CT of the pelvis to assess these areas further. Electronically Signed   By: Ivar Drape M.D.   On: 05/13/2017 12:10   Dg Shoulder Right  Result Date: 05/13/2017 CLINICAL DATA:  Recent fall, back pain EXAM: RIGHT SHOULDER - 2+ VIEW COMPARISON:  Chest x-ray of 05/13/2017 and 05/12/2017 FINDINGS: Fracture separation of the right [facility] joint is noted. There is a significant degenerative joint disease of the right glenohumeral joint with loss of joint space and spurring. Cortical discontinuity of the posterior right seventh rib is noted which could  represent fracture or old deformity. IMPRESSION: 1. Fracture/separation of the right [facility] joint which may be acute. Correlate clinically. 2. 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 Electronically Signed   By: Ivar Drape M.D.   On: 05/13/2017 10:29   Ct Head Wo Contrast  Result Date: 05/13/2017 CLINICAL DATA:  Fall, posterior skull injury, hematoma.  Neck pain. EXAM: CT HEAD WITHOUT CONTRAST CT CERVICAL SPINE WITHOUT CONTRAST TECHNIQUE: Multidetector CT imaging of the head and cervical spine was performed following the standard protocol without intravenous contrast. Multiplanar CT image reconstructions of the cervical spine were also generated. COMPARISON:  None. FINDINGS: CT HEAD FINDINGS Brain: No evidence of acute infarction, hemorrhage, hydrocephalus, extra-axial collection or mass lesion/mass effect. Mild cortical atrophy. Subcortical white matter and periventricular small vessel ischemic changes. Vascular: Intracranial atherosclerosis. Skull: Normal. Negative for fracture or focal lesion. Sinuses/Orbits: Partial opacification of the bilateral ethmoid and sphenoid sinuses. Opacification of the right mastoid air cells. Other: Mild soft tissue swelling/hematoma overlying the right posterior vertex. CT CERVICAL SPINE FINDINGS Alignment: Normal cervical lordosis. Skull base and vertebrae: No acute fracture. No primary bone lesion or focal pathologic process. Soft tissues and spinal canal: No prevertebral fluid or swelling. No visible canal hematoma. Disc levels:  Mild degenerative changes of the mid cervical spine. Spinal canal is patent. Upper chest: Visualized lung apices are notable for biapical pleural-parenchymal scarring. Other: Visualized thyroid is unremarkable. IMPRESSION: Mild soft tissue swelling/hematoma overlying the right posterior vertex. No evidence of calvarial fracture. No evidence of acute intracranial abnormality. Atrophy with small vessel ischemic changes.  Opacification of the right mastoid air cells. No evidence of traumatic injury to the cervical spine. Electronically Signed   By: Julian Hy M.D.   On: 05/13/2017 11:28   Ct Cervical Spine Wo Contrast  Result Date: 05/13/2017 CLINICAL DATA:  Fall, posterior skull injury, hematoma.  Neck pain. EXAM: CT HEAD WITHOUT CONTRAST CT CERVICAL SPINE WITHOUT CONTRAST TECHNIQUE: Multidetector CT imaging of the head and cervical spine was performed following the standard protocol without intravenous contrast. Multiplanar CT image reconstructions of the cervical spine were also generated. COMPARISON:  None. FINDINGS: CT HEAD FINDINGS Brain: No evidence of acute infarction, hemorrhage, hydrocephalus, extra-axial collection or mass lesion/mass effect. Mild cortical atrophy. Subcortical white matter and periventricular small vessel ischemic changes. Vascular: Intracranial atherosclerosis. Skull: Normal. Negative for fracture or focal lesion. Sinuses/Orbits: Partial opacification of the bilateral ethmoid and sphenoid sinuses. Opacification of the right mastoid air cells. Other: Mild soft tissue swelling/hematoma overlying the right posterior vertex. CT CERVICAL SPINE FINDINGS Alignment: Normal cervical lordosis. Skull base and vertebrae: No acute fracture. No primary bone lesion or focal pathologic process. Soft tissues and spinal canal: No prevertebral fluid or swelling. No visible canal hematoma. Disc levels:  Mild degenerative changes  of the mid cervical spine. Spinal canal is patent. Upper chest: Visualized lung apices are notable for biapical pleural-parenchymal scarring. Other: Visualized thyroid is unremarkable. IMPRESSION: Mild soft tissue swelling/hematoma overlying the right posterior vertex. No evidence of calvarial fracture. No evidence of acute intracranial abnormality. Atrophy with small vessel ischemic changes. Opacification of the right mastoid air cells. No evidence of traumatic injury to the cervical  spine. Electronically Signed   By: Julian Hy M.D.   On: 05/13/2017 11:28   Ct Lumbar Spine Wo Contrast  Result Date: 05/13/2017 CLINICAL DATA:  Status post fall yesterday.  Back pain. EXAM: CT LUMBAR SPINE WITHOUT CONTRAST TECHNIQUE: Multidetector CT imaging of the lumbar spine was performed without intravenous contrast administration. Multiplanar CT image reconstructions were also generated. COMPARISON:  None. FINDINGS: Segmentation: 5 lumbar type vertebrae. Alignment: Normal. Vertebrae: Generalized osteopenia. L1 compression fracture with approximately 50% central height loss. 3 mm retropulsion of the posterior inferior margin of L1 vertebral body with minimal impression on the thecal sac. Fracture does not extend into the pedicles or posterior elements. Remainder the vertebral body heights are maintained. No aggressive osseous lesion. No discitis or osteomyelitis. Paraspinal and other soft tissues: No focal paraspinal abnormality. Abdominal aortic atherosclerosis. Other: Osteoarthritis of bilateral sacroiliac joints. 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 Bilateral neural foramina are patent. Bilateral facet arthropathy at L1-2, L2-3, and L3-4. IMPRESSION: 1. Acute L1 vertebral body compression fracture with approximately 50% central height loss. Electronically Signed   By: Kathreen Devoid   On: 05/13/2017 13:28    Positive ROS: All other systems have been reviewed and were otherwise negative with the exception of those mentioned in the HPI and as above.  Physical Exam: General:  Alert, no acute distress Psychiatric:  Patient is appropriately responsive with normal mood and affect   Cardiovascular:  No pedal edema Respiratory:  No wheezing, non-labored breathing GI:  Abdomen is soft and non-tender Skin:  No lesions in the area of chief complaint Neurologic:  Sensation intact distally Lymphatic:  No axillary or cervical  lymphadenopathy  Orthopedic Exam:  Orthopedic examination is limited to the right shoulder and upper extremity.  Skin inspection around the shoulder is unremarkable.  There is perhaps mild swelling dorsally, but no erythema, ecchymosis, abrasions, or other skin abnormalities are identified.  She has moderate focal tenderness to palpation over the distal clavicle, but there is no evidence of the skin being tented or under any threat.  Shoulder range of motion is limited due to discomfort and apprehension, as well as due to her underlying arthritis.  She is able to actively flex and extend her elbow, and actively flex and extend her wrist and all digits.  Sensation is intact to the axillary muscular cutaneous nerve distributions, as well as to her right hand.  She has good capillary refill to her right hand.  X-rays:  X-rays of the right shoulder are available for review.  These films demonstrate significant degenerative changes of the glenohumeral joint with a high riding humeral head abutting and eroding the undersurface of the acromion.  There also appears to be some caudal displacement of the distal clavicle in relation to the acromion.  Assessment: Mildlly displaced right distal clavicle fracture.  Plan: The treatment options are discussed with the patient.  At this point, I do not feel that the patient's injury require surgical intervention, especially given the patient's significant osteoporosis and associated medical history.  Therefore, I recommend that she remain  in her sling or a shoulder immobilizer for the next several weeks for comfort.  She may remove the sling for bathing purposes.  She is to receive appropriate pain medication for comfort.  Thank you for asked me to participate in the care of this most pleasant yet unfortunate woman.  I will be happy to have her return to our office in 3-4 weeks to see either myself or one of my physician assistants in follow-up.   Pascal Lux,  MD  Beeper #:  [phone]  05/13/2017 4:38 PM

## 2017-05-13 NOTE — ED Provider Notes (Signed)
[facility] Emergency Department Provider Note  ____________________________________________   First MD Initiated Contact with Patient 05/13/17 1001     (approximate)  I have reviewed the triage vital signs and the nursing notes.   HISTORY  Chief Complaint Cough; Fall; Back Pain; and Shoulder Pain   HPI Merna Baldi is a 77 y.o. female with a history of CHF, depression and arthritis who is presenting to the emergency department after a fall yesterday.  Says that she was walking to her closet without her cane when she lost balance, falling rightward into the door frame and then flat onto her back.  She says that she also hit her head during the fall but does not report losing consciousness.  Denies any neck pain at this time.  Also reports a cough with "gurgling."  Denies fever.  Has been on Augmentin and then azithromycin by her primary care doctor.  However, that she took the first dose of azithromycin this morning and had no improvement in her cough and cold type symptoms.  Says that she is also been progressively more short of breath.  Does not report any chest pain.  Patient complaining of low back pain without any weakness or numbness to the bilateral lower extremities.  Also with pain to the right shoulder where she fell into the door frame.  Past Medical History:  Diagnosis Date  . Arthritis   . CHF (congestive heart failure) ([place])   . Depressed   . Renal disorder     There are no active problems to display for this patient.   History reviewed. No pertinent surgical history.  Prior to Admission medications   Medication Sig Start Date End Date Taking? Authorizing Provider  budesonide (PULMICORT) 0.5 MG/2ML nebulizer solution Take 2 mLs by nebulization daily. 09/22/16  Yes [provider]  acetaminophen (TYLENOL) 500 MG tablet Take 500 mg by mouth 2 (two) times daily. 10/24/09   [provider]  aspirin EC 81 MG tablet Take 81 mg by  mouth daily.    [provider]  atorvastatin (LIPITOR) 20 MG tablet Take 20 mg by mouth daily. 10/27/13   [provider]  azithromycin (ZITHROMAX) 250 MG tablet Take 1 tablet by mouth daily. 05/12/17   [provider]  cetirizine (ZYRTEC) 10 MG tablet Take 10 mg by mouth daily.    [provider]  Cholecalciferol (VITAMIN D-1000 MAX ST) 1000 units tablet Take 1,000 mg by mouth daily. 06/21/15   [provider]  conjugated estrogens (PREMARIN) vaginal cream Place 1 Applicatorful vaginally at bedtime. Do not use applicator. Apply pea sized application to the urethra each night before bedtime. 11/30/13   [provider]  Dextromethorphan-Guaifenesin (TUSSIN DM) 10-100 MG/5ML liquid Take 5 mLs by mouth every 6 (six) hours as needed.    [provider]  ferrous sulfate 325 (65 FE) MG tablet Take 325 mg by mouth 2 (two) times daily.    [provider]  guaiFENesin (MUCINEX) 600 MG 12 hr tablet Take 600 mg by mouth 2 (two) times daily as needed.    [provider]  HYDROcodone-acetaminophen (NORCO/VICODIN) 5-325 MG tablet Take 1 tablet by mouth 2 (two) times daily. 04/09/16   [provider]  hydroxychloroquine (PLAQUENIL) 200 MG tablet Take 200 mg by mouth daily. 01/18/16   [provider]  LORazepam (ATIVAN) 1 MG tablet Take 1 mg by mouth daily.    [provider]  metoprolol succinate (TOPROL-XL) 25 MG 24 hr  tablet Take 1 tablet by mouth daily. 05/12/17   [provider]  mirtazapine (REMERON) 30 MG tablet Take 30 mg by mouth at bedtime.    [provider]  pantoprazole (PROTONIX) 40 MG tablet Take 1 tablet by mouth daily. 05/12/17   [provider]  PROAIR HFA 108 (90 Base) MCG/ACT inhaler Inhale 2 puffs into the lungs every 4 (four) hours as needed. 05/12/17   [provider]  rOPINIRole (REQUIP) 0.5 MG tablet Take 1 tablet by mouth daily. 05/12/17   [provider]  senna-docusate (SENOKOT-S) 8.6-50 MG tablet Take 1 tablet by mouth 2 (two) times daily.    [provider]  Suvorexant (BELSOMRA) 10 MG TABS Take 10 mg by mouth daily.    [provider]  triamcinolone cream (KENALOG) 0.1 % Apply 0.1 % topically 2 (two) times daily as needed.    [provider]    Allergies Patient has no known allergies.  No family history on file.  Social History Social History   Tobacco Use  . Smoking status: Never Smoker  . Smokeless tobacco: Never Used  Substance Use Topics  . Alcohol use: No  . Drug use: Not on file    Review of Systems  Constitutional: No fever/chills Eyes: No visual changes. ENT: No sore throat. Cardiovascular: Denies chest pain. Respiratory: As above  gastrointestinal: No abdominal pain.  No nausea, no vomiting.  No diarrhea.  No constipation. Genitourinary: Negative for dysuria. Musculoskeletal: As above Skin: Negative for rash. Neurological: Negative for headaches, focal weakness or numbness.   ____________________________________________   PHYSICAL EXAM:  VITAL SIGNS: ED Triage Vitals  Enc Vitals Group     BP 05/13/17 0937 (!) 89/61     Pulse Rate 05/13/17 0937 86     Resp 05/13/17 0937 20     Temp 05/13/17 0937 97.8 F (36.6 C)     Temp Source 05/13/17 0937 Oral     SpO2 05/13/17 0937 92 %     Weight 05/13/17 0942 107 lb (48.5 kg)     Height 05/13/17 0942 4\' 8"  (1.422 m)     Head Circumference --      Peak Flow --      Pain Score 05/13/17 0941 8     Pain Loc --      Pain Edu? --      Excl. in [place]? --     Constitutional: Alert and oriented.  Appears weak. Eyes: Conjunctivae are normal.  Head: Hematoma to the occiput that is about 8 x 6 cm.  No bogginess.  Mild tenderness to palpation overlying the hematoma. Nose: No congestion/rhinnorhea. Mouth/Throat: Mucous membranes are moist.  Neck: No stridor.  No tenderness palpation of the midline cervical spine.  No deformity or  step-off.   Cardiovascular: Normal rate, regular rhythm. Grossly normal heart sounds.   Respiratory: Normal respiratory effort.  Decreased air movement throughout with prolonged expiratory phase and wheezing. Gastrointestinal: Soft and nontender. No distention. No CVA tenderness. Musculoskeletal: No lower extremity tenderness nor edema.  No joint effusions.  5 out of 5 strength to the bilateral lower extremities.  No saddle anesthesia.  Pelvis is stable and the hips are nontender bilaterally.  Mild tenderness to palpation over the lumbar region without any focal tenderness to palpation.  No thoracic tenderness.  No deformity or step-off.  Right shoulder with anterior swelling over the anterior acromial clavicular junction.  There is also tenderness to palpation.  No tenting of the skin.  Right upper extremity is neurovascularly intact with an intact radial pulse. Neurologic:  Normal speech and language. No gross focal neurologic deficits are appreciated. Skin:  Skin is warm, dry and intact. No rash noted. Psychiatric: Mood and affect are normal. Speech and behavior are normal.  ____________________________________________   LABS (all labs ordered are listed, but only abnormal results are displayed)  Labs Reviewed  CBC WITH DIFFERENTIAL/PLATELET - Abnormal; Notable for the following components:      Result Value   Platelets 139 (*)    Neutro Abs 7.8 (*)    Lymphs Abs 0.4 (*)    All other components within normal limits  COMPREHENSIVE METABOLIC PANEL  TROPONIN I   ____________________________________________  EKG  ED ECG REPORT I, Doran Stabler, the attending physician, personally viewed and interpreted this ECG.   Date: 05/13/2017  EKG Time: 1124  Rate: 89  Rhythm: normal sinus rhythm  Axis: Normal  Intervals:nonspecific intraventricular conduction delay  ST&T Change: No ST segment elevation or depression.  T wave inversions in V2 and  V3.  ____________________________________________  RADIOLOGY  CT of the head and neck without any acute findings.  Lumbar spine x-ray with likely acute compression deformity of L1 of approximately 40% with some retropulsion.  Right shoulder with fracture separation of the right AC joint which may be acute.  Chest x-ray without disease that is acute. ____________________________________________   PROCEDURES  Procedure(s) performed:   .Critical Care Performed by: Orbie Pyo, MD Authorized by: Orbie Pyo, MD   Critical care provider statement:    Critical care time (minutes):  35   Critical care was necessary to treat or prevent imminent or life-threatening deterioration of the following conditions:  Metabolic crisis   Critical care was time spent personally by me on the following activities:  Examination of patient, ordering and performing treatments and interventions, ordering and review of laboratory studies and ordering and review of radiographic studies    Critical Care performed:    ____________________________________________   INITIAL IMPRESSION / ASSESSMENT AND PLAN / ED COURSE  Pertinent labs & imaging results that were available during my care of the patient were reviewed by me and considered in my medical decision making (see chart for details).  DDX: Pneumonia, bronchitis, bronchospasm, intracranial hemorrhage, skull fracture, cervical spine fracture, lumbar spine fracture, acute renal failure, CHF, AC separation, clavicle fracture As part of my medical decision making, I reviewed the following data within the electronic MEDICAL RECORD NUMBER Notes from prior ED visits  ----------------------------------------- 12:10 PM on 05/13/2017 -----------------------------------------  Patient without any further complaints at this time.  Patient found to have low sodium.  Will be given IV normal saline.  Patient be admitted to the hospital for her  multiple acute findings including hyponatremia, clavicular fracture, lumbar fracture as well as bronchospasm.  Patient with lumbar spine films showing retropulsion but without any neurologic deficits.  Patient will be admitted to the hospital.  Signed out to Dr. Jerelyn Charles of the hospitalist service.  Patient aware of the need for admission and willing to comply.       ____________________________________________   FINAL CLINICAL IMPRESSION(S) / ED DIAGNOSES  Hyponatremia.  Right-sided clavicular fracture.  L1 lumbar fracture.  Bronchitis    NEW MEDICATIONS STARTED DURING THIS VISIT:  New Prescriptions   No medications on file     Note:  This document was prepared using Dragon voice recognition software and may include unintentional dictation errors.     Donyell Ding, Randall An, MD  05/13/17 1212  

## 2017-05-13 NOTE — ED Notes (Signed)
Pt in ct 

## 2017-05-13 NOTE — H&P (Addendum)
[facility] [place] NAME: Linda Walsh    MR#:  [id number]  DATE OF BIRTH:  [birth year]  DATE OF ADMISSION:  05/13/2017  PRIMARY CARE PHYSICIAN: Remi Haggard, FNP   REQUESTING/REFERRING PHYSICIAN:   CHIEF COMPLAINT:   Chief Complaint  Patient presents with  . Cough  . Fall  . Back Pain  . Shoulder Pain    HISTORY OF PRESENT ILLNESS: Linda Walsh  is a 77 y.o. female with a known history per below status post mechanical fall on yesterday, patient was not using her cane as required, noted history of dementia, after fall patient noted to have severe right shoulder and back pain, ER workup noted for L1 fracture with retropulsion on x-ray-CT L-spine pending, right clavicular fracture with dislocation of the [facility] joint noted on x-ray, CT head noted for sinus disease, CT C-spine negative, sodium 124, potassium 3, chloride 86, EKG noted for right bundle branch block, patient with noted cough for 4-6 weeks, no better with treatment of course with Augmentin to 3 weeks ago, now on azithromycin-took first dose today, the patient evaluated in the emergency room caregiver at the bedside, patient complains of moderate to severe discomfort/pain in her back and right shoulder only, patient is now been admitted with acute L1 fracture with retropulsion, acute clavicular fracture with dislocated AC joint status post mechanical fall, acute asthmatic exacerbation.  PAST MEDICAL HISTORY:   Past Medical History:  Diagnosis Date  . Arthritis   . CHF (congestive heart failure) ([place])   . Depressed   . Renal disorder     PAST SURGICAL HISTORY: History reviewed. No pertinent surgical history.  SOCIAL HISTORY:  Social History   Tobacco Use  . Smoking status: Never Smoker  . Smokeless tobacco: Never Used  Substance Use Topics  . Alcohol use: No    FAMILY HISTORY: No family history on file.  DRUG ALLERGIES: No Known Allergies  REVIEW OF SYSTEMS: Poor  historian due to dementia  MEDICATIONS AT HOME:  Prior to Admission medications   Medication Sig Start Date End Date Taking? Authorizing Provider  acetaminophen (TYLENOL) 500 MG tablet Take 500 mg by mouth 2 (two) times daily. 10/24/09  Yes [provider]  aspirin EC 81 MG tablet Take 81 mg by mouth daily.   Yes [provider]  atorvastatin (LIPITOR) 20 MG tablet Take 20 mg by mouth daily. 10/27/13  Yes [provider]  azithromycin (ZITHROMAX) 250 MG tablet Take 1 tablet by mouth daily. 05/12/17  Yes [provider]  budesonide (PULMICORT) 0.5 MG/2ML nebulizer solution Take 2 mLs by nebulization daily. 09/22/16  Yes [provider]  cetirizine (ZYRTEC) 10 MG tablet Take 10 mg by mouth daily.   Yes [provider]  Cholecalciferol (VITAMIN D-1000 MAX ST) 1000 units tablet Take 1,000 mg by mouth daily. 06/21/15  Yes [provider]  ferrous sulfate 325 (65 FE) MG tablet Take 325 mg by mouth 2 (two) times daily.   Yes [provider]  HYDROcodone-acetaminophen (NORCO/VICODIN) 5-325 MG tablet Take 1 tablet by mouth 2 (two) times daily. 04/09/16  Yes [provider]  hydroxychloroquine (PLAQUENIL) 200 MG tablet Take 400 mg by mouth daily.  01/18/16  Yes [provider]  Ipratropium-Albuterol (COMBIVENT RESPIMAT IN) Inhale 1 puff into the lungs every 4 (four) hours as needed.   Yes [provider]  LORazepam (ATIVAN) 1 MG tablet Take 1 mg by mouth at bedtime.    Yes  [provider]  mirtazapine (REMERON) 30 MG tablet Take 30 mg by mouth at bedtime.   Yes [provider]  pantoprazole (PROTONIX) 40 MG tablet Take 1 tablet by mouth daily. 05/12/17  Yes [provider]  senna-docusate (SENOKOT-S) 8.6-50 MG tablet Take 1 tablet by mouth 2 (two) times daily.   Yes [provider]  Suvorexant (BELSOMRA) 10 MG TABS Take 15 mg by mouth at bedtime.    Yes [provider]   PROAIR HFA 108 (90 Base) MCG/ACT inhaler Inhale 2 puffs into the lungs every 4 (four) hours as needed. 05/12/17   [provider]  triamcinolone cream (KENALOG) 0.1 % Apply 0.1 % topically 2 (two) times daily as needed.    [provider]      PHYSICAL EXAMINATION:   VITAL SIGNS: Blood pressure (!) 89/61, pulse 88, temperature 97.8 F (36.6 C), temperature source Oral, resp. rate (!) 24, height 4\' 8"  (1.422 m), weight 48.5 kg (107 lb), SpO2 98 %.  GENERAL:  78 y.o.-year-old patient lying in the bed with no acute distress.  Frail-appearing EYES: Pupils equal, round, reactive to light and accommodation. No scleral icterus. Extraocular muscles intact.  HEENT: Head atraumatic, normocephalic. Oropharynx and nasopharynx clear.  NECK:  Supple, no jugular venous distention. No thyroid enlargement, no tenderness.  LUNGS: Coarse breath sounds on auscultation bilaterally. No use of accessory muscles of respiration.  CARDIOVASCULAR: S1, S2 normal. No murmurs, rubs, or gallops.  ABDOMEN: Soft, nontender, nondistended. Bowel sounds present. No organomegaly or mass.  EXTREMITIES: No pedal edema, cyanosis, or clubbing.  NEUROLOGIC: Cranial nerves II through XII are intact. MAES, decreased range of motion of right upper extremity and torso due to pain. Gait not checked.  PSYCHIATRIC: The patient is alert and oriented x 3.  SKIN: No obvious rash, lesion, or ulcer.   LABORATORY PANEL:   CBC Recent Labs  Lab 05/13/17 1123  WBC 9.2  HGB 14.0  HCT 41.4  PLT 139*  MCV 89.5  MCH 30.3  MCHC 33.9  RDW 13.4  LYMPHSABS 0.4*  MONOABS 0.9  EOSABS 0.0  BASOSABS 0.0   ------------------------------------------------------------------------------------------------------------------  Chemistries  Recent Labs  Lab 05/13/17 1123  NA 124*  K 3.0*  CL 86*  CO2 25  GLUCOSE 150*  BUN 12  CREATININE 0.74  CALCIUM 8.8*  AST 45*  ALT 18  ALKPHOS 65  BILITOT 1.0    ------------------------------------------------------------------------------------------------------------------ estimated creatinine clearance is 38.9 mL/min (by C-G formula based on SCr of 0.74 mg/dL). ------------------------------------------------------------------------------------------------------------------ No results for input(s): TSH, T4TOTAL, T3FREE, THYROIDAB in the last 72 hours.  Invalid input(s): FREET3   Coagulation profile No results for input(s): INR, PROTIME in the last 168 hours. ------------------------------------------------------------------------------------------------------------------- No results for input(s): DDIMER in the last 72 hours. -------------------------------------------------------------------------------------------------------------------  Cardiac Enzymes Recent Labs  Lab 05/13/17 1123  TROPONINI <0.03   ------------------------------------------------------------------------------------------------------------------ Invalid input(s): POCBNP  ---------------------------------------------------------------------------------------------------------------  Urinalysis    Component Value Date/Time   COLORURINE YELLOW (A) 04/30/2016 1431   APPEARANCEUR CLEAR (A) 04/30/2016 1431   LABSPEC 1.020 04/30/2016 1431   PHURINE 5.0 04/30/2016 1431   GLUCOSEU NEGATIVE 04/30/2016 1431   HGBUR SMALL (A) 04/30/2016 1431   BILIRUBINUR NEGATIVE 04/30/2016 1431   KETONESUR 80 (A) 04/30/2016 1431   PROTEINUR 30 (A) 04/30/2016 1431   NITRITE NEGATIVE 04/30/2016 1431   LEUKOCYTESUR NEGATIVE 04/30/2016 1431     RADIOLOGY: Dg Chest 2 View  Result Date: 05/13/2017 CLINICAL DATA:  Cough, recent fall EXAM: CHEST  2 VIEW COMPARISON:  Chest x-ray of 05/12/2017 FINDINGS: The lungs remain somewhat hyperaerated but clear. No pneumonia or effusion is seen. Mediastinal hilar contours are unremarkable and the heart is within normal limits in size. The bones are  osteopenic. There are diffuse degenerative changes in the shoulders. The right distal clavicle fracture with separation of the right AC joint is not as well seen by chest x-ray. IMPRESSION: 1. No active lung disease.  Slight hyper aeration. 2. The fracture of the distal right clavicle with dislocation of the right AC joint noted on right shoulder films is not as well seen by chest x-ray. 3. Degenerative changes of the shoulders. Electronically Signed   By: Ivar Drape M.D.   On: 05/13/2017 10:39   Dg Chest 2 View  Result Date: 05/13/2017 CLINICAL DATA:  77 year old female history of cough for 1 month. EXAM: CHEST  2 VIEW COMPARISON:  Chest x-ray 04/30/2016. FINDINGS: Lung volumes are normal. No consolidative airspace disease. No pleural effusions. No pneumothorax. No pulmonary nodule or mass noted. Pulmonary vasculature and the cardiomediastinal silhouette are within normal limits. Atherosclerosis in the thoracic aorta. IMPRESSION: 1.  No radiographic evidence of acute cardiopulmonary disease. 2. Aortic atherosclerosis. Electronically Signed   By: Vinnie Langton M.D.   On: 05/13/2017 08:47   Dg Lumbar Spine Complete  Result Date: 05/13/2017 CLINICAL DATA:  Cough, fell yesterday with some low back pain EXAM: LUMBAR SPINE - COMPLETE 4+ VIEW COMPARISON:  None. FINDINGS: The lumbar vertebrae are slightly straightened in alignment. There is what appears to be and acute compression deformity of L1 vertebral body of approximately 40%. There may be slight retropulsion at the level of the compression deformity. The bones are osteopenic. There is degenerative disc disease at L4-5 with some loss of disc space. The SI joints are corticated. IMPRESSION: 1. Probable acute compression deformity of L1 vertebral body of approximately 40% with some retropulsion. 2. Diffuse osteopenia. 3. 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 Electronically Signed   By: Ivar Drape M.D.   On: 05/13/2017 10:26   Dg Pelvis 1-2  Views  Result Date: 05/13/2017 CLINICAL DATA:  [place] yesterday now with low back pain EXAM: PELVIS - 1-2 VIEW COMPARISON:  None FINDINGS: There is mild degenerative joint disease with some loss of joint space bilaterally. No hip fracture is seen. There appears to be and old left inferior pubic ramus fracture but clinical correlation is recommended. Also there is a faint lucency through the right pubic ramus most likely overlapping, but a subtle fracture cannot be excluded. The pubic ramus is in normal position. The SI joints are corticated. IMPRESSION: 1. Probable old fracture of the left inferior pelvic ramus. Correlate clinically. 2. Vague lucency through the right pubic ramus may be due to overlapping structures, but correlate clinically or consider CT of the pelvis to assess these areas further. Electronically Signed   By: Ivar Drape M.D.   On: 05/13/2017 12:10   Dg Shoulder Right  Result Date: 05/13/2017 CLINICAL DATA:  Recent fall, back pain EXAM: RIGHT SHOULDER - 2+ VIEW COMPARISON:  Chest x-ray of 05/13/2017 and 05/12/2017 FINDINGS: Fracture separation of the right [facility] joint is noted. There is a significant degenerative joint disease of the right glenohumeral joint with loss of joint space and spurring. Cortical discontinuity of the posterior right seventh rib is noted which could represent fracture or old deformity. IMPRESSION: 1. Fracture/separation of the right [facility] joint which may be acute. Correlate clinically. 2. 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 Electronically  Signed   By: Ivar Drape M.D.   On: 05/13/2017 10:29   Ct Head Wo Contrast  Result Date: 05/13/2017 CLINICAL DATA:  Fall, posterior skull injury, hematoma.  Neck pain. EXAM: CT HEAD WITHOUT CONTRAST CT CERVICAL SPINE WITHOUT CONTRAST TECHNIQUE: Multidetector CT imaging of the head and cervical spine was performed following the standard protocol without intravenous contrast. Multiplanar CT image  reconstructions of the cervical spine were also generated. COMPARISON:  None. FINDINGS: CT HEAD FINDINGS Brain: No evidence of acute infarction, hemorrhage, hydrocephalus, extra-axial collection or mass lesion/mass effect. Mild cortical atrophy. Subcortical white matter and periventricular small vessel ischemic changes. Vascular: Intracranial atherosclerosis. Skull: Normal. Negative for fracture or focal lesion. Sinuses/Orbits: Partial opacification of the bilateral ethmoid and sphenoid sinuses. Opacification of the right mastoid air cells. Other: Mild soft tissue swelling/hematoma overlying the right posterior vertex. CT CERVICAL SPINE FINDINGS Alignment: Normal cervical lordosis. Skull base and vertebrae: No acute fracture. No primary bone lesion or focal pathologic process. Soft tissues and spinal canal: No prevertebral fluid or swelling. No visible canal hematoma. Disc levels:  Mild degenerative changes of the mid cervical spine. Spinal canal is patent. Upper chest: Visualized lung apices are notable for biapical pleural-parenchymal scarring. Other: Visualized thyroid is unremarkable. IMPRESSION: Mild soft tissue swelling/hematoma overlying the right posterior vertex. No evidence of calvarial fracture. No evidence of acute intracranial abnormality. Atrophy with small vessel ischemic changes. Opacification of the right mastoid air cells. No evidence of traumatic injury to the cervical spine. Electronically Signed   By: Julian Hy M.D.   On: 05/13/2017 11:28   Ct Cervical Spine Wo Contrast  Result Date: 05/13/2017 CLINICAL DATA:  Fall, posterior skull injury, hematoma.  Neck pain. EXAM: CT HEAD WITHOUT CONTRAST CT CERVICAL SPINE WITHOUT CONTRAST TECHNIQUE: Multidetector CT imaging of the head and cervical spine was performed following the standard protocol without intravenous contrast. Multiplanar CT image reconstructions of the cervical spine were also generated. COMPARISON:  None. FINDINGS: CT HEAD  FINDINGS Brain: No evidence of acute infarction, hemorrhage, hydrocephalus, extra-axial collection or mass lesion/mass effect. Mild cortical atrophy. Subcortical white matter and periventricular small vessel ischemic changes. Vascular: Intracranial atherosclerosis. Skull: Normal. Negative for fracture or focal lesion. Sinuses/Orbits: Partial opacification of the bilateral ethmoid and sphenoid sinuses. Opacification of the right mastoid air cells. Other: Mild soft tissue swelling/hematoma overlying the right posterior vertex. CT CERVICAL SPINE FINDINGS Alignment: Normal cervical lordosis. Skull base and vertebrae: No acute fracture. No primary bone lesion or focal pathologic process. Soft tissues and spinal canal: No prevertebral fluid or swelling. No visible canal hematoma. Disc levels:  Mild degenerative changes of the mid cervical spine. Spinal canal is patent. Upper chest: Visualized lung apices are notable for biapical pleural-parenchymal scarring. Other: Visualized thyroid is unremarkable. IMPRESSION: Mild soft tissue swelling/hematoma overlying the right posterior vertex. No evidence of calvarial fracture. No evidence of acute intracranial abnormality. Atrophy with small vessel ischemic changes. Opacification of the right mastoid air cells. No evidence of traumatic injury to the cervical spine. Electronically Signed   By: Julian Hy M.D.   On: 05/13/2017 11:28    EKG: Orders placed or performed during the hospital encounter of 05/13/17  . ED EKG  . ED EKG    IMPRESSION AND PLAN: 1 acute L1 fracture with retropulsion Status post mechanical fall, not using cane, noted history of dementia Suspected possible burst fracture Admit to regular nursing floor bed, consult neurosurgery for expert opinion, follow-up on CT of the L-spine for further evaluation for  possible burst fracture, bedrest for now, adult pain protocol, neurochecks per routine, and continue close medical monitoring  2 acute  right clavicular fracture with dislocated AC joint Stable Orthopedic surgery to see, adult pain protocol, sling to upper extremity  3 acute probable mild asthmatic exacerbation Subacute to chronic cough We will place on short course of IV Solu-Medrol with tapering as tolerated, continue home regiment, Mucinex, will continue outpatient empiric azithromycin, supplemental oxygen as needed, respiratory therapy to see, check sputum cultures  4 chronic depression  Stable  Continue home regiment   5 chronic dementia  Stable  Continue home psychotropic regiment   6 acute hyponatremia/hypochloremia/hypokalemia Replete with IV fluids, p.o. potassium, check magnesium/BMP in the morning  Full code  Condition guarded  Long-term prognosis poor  Disposition pending clinical course  All the records are reviewed and case discussed with ED provider. Management plans discussed with the patient, family and they are in agreement.  CODE STATUS: Code Status History    This patient does not have a recorded code status. Please follow your organizational policy for patients in this situation.       TOTAL TIME TAKING CARE OF THIS PATIENT: 45 minutes.    Avel Peace Lysette Lindenbaum M.D on 05/13/2017   Between 7am to 6pm - Pager - [phone]  After 6pm go to [email or web address] - password [email or web address] [place] [facility]  Office  [phone]  CC: Primary care physician; Remi Haggard, FNP   Note: This dictation was prepared with Dragon dictation along with smaller phrase technology. Any transcriptional errors that result from this process are unintentional.

## 2017-05-13 NOTE — ED Notes (Signed)
Pt removed from o2. Found to have sats 88-89%. Pt placed back on 1 LNC

## 2017-05-13 NOTE — Progress Notes (Signed)
PHARMACY NOTE:  ANTIMICROBIAL RENAL DOSAGE ADJUSTMENT  Current antimicrobial regimen includes a mismatch between antimicrobial dosage and estimated renal function.  As per policy approved by the Pharmacy & Therapeutics and Medical Executive Committees, the antimicrobial dosage will be adjusted accordingly.  Current antimicrobial dosage:  Tamiflu 75 mg PO BID   Indication: Flu   Renal Function:  Estimated Creatinine Clearance: 38.6 mL/min (by C-G formula based on SCr of 0.74 mg/dL). []      On intermittent HD, scheduled: []      On CRRT    Antimicrobial dosage has been changed to:  Tamiflu 30 mg PO BID   Additional comments:   Thank you for allowing pharmacy to be a part of this patient's care.  Sophiamarie Nease D, [facility] 05/13/2017 6:44 PM

## 2017-05-14 LAB — BASIC METABOLIC PANEL
ANION GAP: 6 (ref 5–15)
BUN: 10 mg/dL (ref 6–20)
CALCIUM: 8.1 mg/dL — AB (ref 8.9–10.3)
CO2: 27 mmol/L (ref 22–32)
Chloride: 95 mmol/L — ABNORMAL LOW (ref 101–111)
Creatinine, Ser: 0.49 mg/dL (ref 0.44–1.00)
Glucose, Bld: 158 mg/dL — ABNORMAL HIGH (ref 65–99)
POTASSIUM: 4 mmol/L (ref 3.5–5.1)
Sodium: 128 mmol/L — ABNORMAL LOW (ref 135–145)

## 2017-05-14 LAB — MAGNESIUM: Magnesium: 1.7 mg/dL (ref 1.7–2.4)

## 2017-05-14 MED ORDER — ENSURE ENLIVE PO LIQD
237.0000 mL | Freq: Two times a day (BID) | ORAL | Status: DC
  Administered 2017-05-14 – 2017-05-15 (×2): 237 mL via ORAL

## 2017-05-14 MED ORDER — METHYLPREDNISOLONE SODIUM SUCC 40 MG IJ SOLR
40.0000 mg | Freq: Three times a day (TID) | INTRAMUSCULAR | Status: DC
  Administered 2017-05-14 – 2017-05-15 (×3): 40 mg via INTRAVENOUS
  Filled 2017-05-14 (×3): qty 1

## 2017-05-14 MED ORDER — MAGNESIUM SULFATE 2 GM/50ML IV SOLN
2.0000 g | Freq: Once | INTRAVENOUS | Status: AC
  Administered 2017-05-14: 2 g via INTRAVENOUS
  Filled 2017-05-14: qty 50

## 2017-05-14 MED ORDER — CALCIUM CARBONATE-VITAMIN D 500-200 MG-UNIT PO TABS
1.0000 | ORAL_TABLET | Freq: Two times a day (BID) | ORAL | Status: DC
  Administered 2017-05-14 – 2017-05-15 (×2): 1 via ORAL
  Filled 2017-05-14 (×2): qty 1

## 2017-05-14 MED ORDER — SODIUM CHLORIDE 0.9 % IV SOLN
INTRAVENOUS | Status: DC
  Administered 2017-05-14: 16:00:00 via INTRAVENOUS

## 2017-05-14 MED ORDER — MUPIROCIN 2 % EX OINT
1.0000 "application " | TOPICAL_OINTMENT | Freq: Two times a day (BID) | CUTANEOUS | Status: DC
  Administered 2017-05-14 – 2017-05-15 (×2): 1 via NASAL
  Filled 2017-05-14: qty 22

## 2017-05-14 NOTE — Clinical Social Work Note (Addendum)
Clinical Social Work Assessment  Patient Details  Name: Annalea Alguire MRN: [id number] Date of Birth: [birth year]  Date of referral:  05/14/17               Reason for consult:  Facility Placement, Discharge Planning                Permission sought to share information with:  Chartered certified accountant granted to share information::  Yes, Verbal Permission Granted  Name::      [place]::   [place]  Relationship::     Contact Information:     Housing/Transportation Living arrangements for the past 2 months:  [place] of Information:  Patient, Facility Patient Interpreter Needed:  None Criminal Activity/Legal Involvement Pertinent to Current Situation/Hospitalization:  No - Comment as needed Significant Relationships:  Adult Children Lives with:  Facility Resident Do you feel safe going back to the place where you live?  Yes Need for family participation in patient care:  Yes (Comment)  Care giving concerns: Patient has been a resident at [facility] in [place] for 8 years.   Social Worker assessment / plan: Holiday representative (CSW) reviewed patient's chart and noted that patient is a resident at [facility]. PT is pending. Patient has tested positive for the flu. Social work Theatre manager met with patient alone at bedside. Patient was laying in bed alert and oriented x4. Social work Theatre manager introduced self and explained the role of the [place]. Patient shared that she has been a resident at [facility] for 8 years and her primary contact is son Audry Pili [phone]). Patient also shared that she is comfortable returning back to [facility]. Patient stated that she uses a cane to assist with mobility. Social work Theatre manager explained that PT is pending (put in brace then ambulate), they might make a recommendation for a SNF placement and [employer] will have to approve. Patient verbalized her understanding and prefers  to go back to [facility]. FL2 completed.   CSW contacted [facility] owner of [place] and made her aware that patient is positive for the flu. Per Marcelline Mates, patient may come back to the group when stable for discharge. Per Marcelline Mates patient is her own guardian and her been at the group home since 10/24/2009. Per [place] patient is on room air at baseline and walks slowly with a cane. CSW and social work Theatre manager will continue to follow up and assist.  Employment status:  Retired Nurse, adult PT Recommendations:  Not assessed at this time Information / Referral to community resources:     Patient/Family's Response to care:  Patient prefers to go back to [facility].  Patient/Family's Understanding of and Emotional Response to Diagnosis, Current Treatment, and Prognosis:  Patient was pleasant and thanked social work Theatre manager for her assistance.  Emotional Assessment Appearance:  Appears stated age Attitude/Demeanor/Rapport:    Affect (typically observed):  Calm, Pleasant, Accepting Orientation:  Oriented to Self, Oriented to Place, Oriented to  Time, Oriented to Situation Alcohol / Substance use:  Not Applicable Psych involvement (Current and /or in the community):  No (Comment)  Discharge Needs  Concerns to be addressed:  Discharge Planning Concerns, Care Coordination Readmission within the last 30 days:  No Current discharge risk:  Dependent with Mobility Barriers to Discharge:  Continued Medical Work up   Smith Mince, Student-Social Work 05/14/2017, 9:28 AM

## 2017-05-14 NOTE — Evaluation (Signed)
Occupational Therapy Evaluation Patient Details Name: Linda Walsh MRN: [id number] DOB: 11-Aug-1940 Today's Date: 05/14/2017    History of Present Illness 77 y.o. female status post fall, patient was not using her cane. after fall she complained of severe right shoulder and back pain, L1 fracture and R clavicular fx.   Clinical Impression   Pt seen for OT evaluation this date, limited by back pain and R shoulder pain. RN in room providing pain medications during session. Pt recently worked with PT. 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 Pt able to compensate well to take medication and hold a cup with straw and bring to her mouth. RUE in sling, adjusted for comfort with pt noting mild improvement in pain. Pt enjoys doing puzzles, watching tv, and goes out to eat with her son a couple times a month. Pt fairly independent, requiring assist for getting in/out of the shower, but able to perform sponge bath at sink more independently. Pt eager to return to home. Pt will benefit from skilled OT services to address noted impairments and functional deficits in UB and LB ADL tasks while maintaining RUE NWBing with sling, and TSLO brace donned when out of bed. Recommend follow up [place] to support optimal return to PLOF and minimize future falls risk, caregiver burden, and need for higher level of care.    Follow Up Recommendations  Home health OT    Equipment Recommendations  3 in 1 bedside commode    Recommendations for Other Services       Precautions / Restrictions Precautions Precautions: Fall Required Braces or Orthoses: Sling;Other Brace/Splint Other Brace/Splint: TLSO brace for OOB Restrictions Weight Bearing Restrictions: Yes RUE Weight Bearing: Non weight bearing(order said sling)      Mobility Bed Mobility    General bed mobility comments: pt fatigued, in pain, RN providing medication  during session  Transfers         General transfer comment: pt fatigued, in pain, RN providing medication during session    Balance                                   ADL either performed or assessed with clinical judgement   ADL Overall ADL's : Needs assistance/impaired Eating/Feeding: Sitting;Modified independent Eating/Feeding Details (indicate cue type and reason): per RN pt with history of swallowing difficulties, compensates well, pt able to take medication in whole pill form with water through straw, chews up medication with no s/s aspiration or choking noted Grooming: Sitting;Modified independent Grooming Details (indicate cue type and reason): mod indep with grooming tasks from seated position, using LUE, modified grasp due to ulnar deviation of fingers Upper Body Bathing: Minimal assistance;Sitting   Lower Body Bathing: Sitting/lateral leans;Minimal assistance;Moderate assistance   Upper Body Dressing : Sitting;Minimal assistance Upper Body Dressing Details (indicate cue type and reason): would benefit from instruction in hemi techniques to minimize use of RUE while in sling for clavicle fx Lower Body Dressing: Sit to/from stand;Moderate assistance;Minimal assistance   Toilet Transfer: Minimal assistance;Ambulation;BSC Toilet Transfer Details (indicate cue type and reason): w/ SPC Toileting- Clothing Manipulation and Hygiene: Sitting/lateral lean;Set up;Supervision/safety;Cueing for compensatory techniques Toileting - Clothing Manipulation Details (indicate cue type and reason): VC for using L hand     Functional mobility during ADLs: Minimal assistance;[employer]  Vision Baseline Vision/History: Wears glasses Wears Glasses: At all times Patient Visual Report: No change from baseline       Perception     Praxis      Pertinent Vitals/Pain Pain Assessment: 0-10 Pain Score: 7  Pain Location: R shoulder and mid back Pain Descriptors / Indicators:  Aching Pain Intervention(s): Monitored during session;Limited activity within patient's tolerance;RN gave pain meds during session     Hand Dominance Right   Extremity/Trunk Assessment Upper Extremity Assessment Upper Extremity Assessment: Generalized weakness;RUE deficits/detail(significant RA limiting AROM bilaterally, significant ulnar deviation of fingers in B hands) RUE Deficits / Details: RUE in sling, adjusted for comfort, pt attempts to use RUE in tasks requiring verbal cues for using L hand RUE: Unable to fully assess due to immobilization RUE Coordination: decreased gross motor;decreased fine motor   Lower Extremity Assessment Lower Extremity Assessment: Generalized weakness       Communication Communication Communication: HOH   Cognition Arousal/Alertness: Awake/alert Behavior During Therapy: WFL for tasks assessed/performed Overall Cognitive Status: Within Functional Limits for tasks assessed                                     General Comments       Exercises     Shoulder Instructions      Home Living Family/patient expects to be discharged to:: Group home                                 Additional Comments: Pt reports she is able to get out with some regularity, occasionally does not use SPC in home      Prior Functioning/Environment Level of Independence: Independent with assistive device(s);Needs assistance  Gait / Transfers Assistance Needed: ambulates with SPC but occasionally does not use ADL's / Homemaking Assistance Needed: states she does not typically need a lot of assist in the group home, but does endorse need for assist with shower transfer and bathing, able to sponge bath independently, dresses with modified independence  Communication / Swallowing Assistance Needed: history of swallowing difficulties per RN, compensates/manages well Comments: states she does not typically need a lot of assist in the group home         OT Problem List: Decreased strength;Decreased knowledge of use of DME or AE;Decreased range of motion;Decreased knowledge of precautions;Decreased activity tolerance;Impaired UE functional use;Pain;Impaired balance (sitting and/or standing)      OT Treatment/Interventions: Self-care/ADL training;Balance training;Therapeutic exercise;Therapeutic activities;DME and/or AE instruction;Patient/family education    OT Goals(Current goals can be found in the care plan section) Acute Rehab OT Goals Patient Stated Goal: Go back to the group home OT Goal Formulation: With patient Time For Goal Achievement: 05/28/17 Potential to Achieve Goals: Good  OT Frequency: Min 2X/week   Barriers to D/C:            Co-evaluation              AM-PAC PT "6 Clicks" Daily Activity     Outcome Measure Help from another person eating meals?: None Help from another person taking care of personal grooming?: None Help from another person toileting, which includes using toliet, bedpan, or urinal?: A Little Help from another person bathing (including washing, rinsing, drying)?: A Lot Help from another person to put on and taking off regular upper body clothing?: A Little Help from another person to  put on and taking off regular lower body clothing?: A Lot 6 Click Score: 18   End of Session    Activity Tolerance: Patient limited by pain Patient left: in bed;with call bell/phone within reach;with bed alarm set;with nursing/sitter in room  OT Visit Diagnosis: Other abnormalities of gait and mobility (R26.89);Muscle weakness (generalized) (M62.81);History of falling (Z91.81);Pain Pain - Right/Left: Right Pain - part of body: Shoulder                Time: 9798-9211 OT Time Calculation (min): 18 min Charges:  OT General Charges $OT Visit: 1 Visit OT Evaluation $OT Eval Low Complexity: 1 Low  Jeni Salles, MPH, MS, OTR/L ascom [phone] 05/14/17, 4:41 PM

## 2017-05-14 NOTE — Progress Notes (Signed)
[facility] NAME: Linda Walsh    MR#:  [id number]  DATE OF BIRTH:  Jan 01, 1941  SUBJECTIVE:   Patient here with L1 fx and clavicle fx SOB and wheezing this am  REVIEW OF SYSTEMS:    Review of Systems  Constitutional: Negative for fever, chills weight loss HENT: Negative for ear pain, nosebleeds, congestion, facial swelling, rhinorrhea, neck pain, neck stiffness and ear discharge.   Respiratory: ++ for cough, shortness of breath, wheezing  Cardiovascular: Negative for chest pain, palpitations and leg swelling.  Gastrointestinal: Negative for heartburn, abdominal pain, vomiting, diarrhea or consitpation Genitourinary: Negative for dysuria, urgency, frequency, hematuria Musculoskeletal: ++ for back pain and shoulder pain Neurological: Negative for dizziness, seizures, syncope, focal weakness,  numbness and headaches.  Hematological: Does not bruise/bleed easily.  Psychiatric/Behavioral: Negative for hallucinations, confusion, dysphoric mood    Tolerating Diet: yes      DRUG ALLERGIES:  No Known Allergies  VITALS:  Blood pressure (!) 159/93, pulse 88, temperature 98 F (36.7 C), temperature source Oral, resp. rate 16, height 4\' 8"  (1.422 m), weight 47.9 kg (105 lb 9.6 oz), SpO2 93 %.  PHYSICAL EXAMINATION:  Constitutional: Appears well-developed and well-nourished. No distress. HENT: Normocephalic. Marland Kitchen Oropharynx is clear and moist.  Eyes: Conjunctivae and EOM are normal. PERRLA, no scleral icterus.  Neck: Normal ROM. Neck supple. No JVD. No tracheal deviation. CVS: RRR, S1/S2 +, no murmurs, no gallops, no carotid bruit.  Pulmonary: Effort nml with b/l wheezing no rhochii Abdominal: Soft. BS +,  no distension, tenderness, rebound or guarding.  Musculoskeletal: decreased ROM right shoulder from clavicle fx No edema and no tenderness.  Neuro: Alert. CN 2-12 grossly intact. No focal deficits. Skin: Skin is warm and dry. No  rash noted. Psychiatric: Normal mood and affect.      LABORATORY PANEL:   CBC Recent Labs  Lab 05/13/17 1123  WBC 9.2  HGB 14.0  HCT 41.4  PLT 139*   ------------------------------------------------------------------------------------------------------------------  Chemistries  Recent Labs  Lab 05/13/17 1123 05/14/17 0513  NA 124* 128*  K 3.0* 4.0  CL 86* 95*  CO2 25 27  GLUCOSE 150* 158*  BUN 12 10  CREATININE 0.74 0.49  CALCIUM 8.8* 8.1*  MG  --  1.7  AST 45*  --   ALT 18  --   ALKPHOS 65  --   BILITOT 1.0  --    ------------------------------------------------------------------------------------------------------------------  Cardiac Enzymes Recent Labs  Lab 05/13/17 1123  TROPONINI <0.03   ------------------------------------------------------------------------------------------------------------------  RADIOLOGY:  Dg Chest 2 View  Result Date: 05/13/2017 CLINICAL DATA:  Cough, recent fall EXAM: CHEST  2 VIEW COMPARISON:  Chest x-ray of 05/12/2017 FINDINGS: The lungs remain somewhat hyperaerated but clear. No pneumonia or effusion is seen. Mediastinal hilar contours are unremarkable and the heart is within normal limits in size. The bones are osteopenic. There are diffuse degenerative changes in the shoulders. The right distal clavicle fracture with separation of the right AC joint is not as well seen by chest x-ray. IMPRESSION: 1. No active lung disease.  Slight hyper aeration. 2. The fracture of the distal right clavicle with dislocation of the right AC joint noted on right shoulder films is not as well seen by chest x-ray. 3. Degenerative changes of the shoulders. Electronically Signed   By: Ivar Drape M.D.   On: 05/13/2017 10:39   Dg Chest 2 View  Result Date: 05/13/2017 CLINICAL DATA:  77 year old female history of cough for  1 month. EXAM: CHEST  2 VIEW COMPARISON:  Chest x-ray 04/30/2016. FINDINGS: Lung volumes are normal. No consolidative airspace  disease. No pleural effusions. No pneumothorax. No pulmonary nodule or mass noted. Pulmonary vasculature and the cardiomediastinal silhouette are within normal limits. Atherosclerosis in the thoracic aorta. IMPRESSION: 1.  No radiographic evidence of acute cardiopulmonary disease. 2. Aortic atherosclerosis. Electronically Signed   By: Vinnie Langton M.D.   On: 05/13/2017 08:47   Dg Lumbar Spine Complete  Result Date: 05/13/2017 CLINICAL DATA:  Cough, fell yesterday with some low back pain EXAM: LUMBAR SPINE - COMPLETE 4+ VIEW COMPARISON:  None. FINDINGS: The lumbar vertebrae are slightly straightened in alignment. There is what appears to be and acute compression deformity of L1 vertebral body of approximately 40%. There may be slight retropulsion at the level of the compression deformity. The bones are osteopenic. There is degenerative disc disease at L4-5 with some loss of disc space. The SI joints are corticated. IMPRESSION: 1. Probable acute compression deformity of L1 vertebral body of approximately 40% with some retropulsion. 2. Diffuse osteopenia. 3. 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 Electronically Signed   By: Ivar Drape M.D.   On: 05/13/2017 10:26   Dg Pelvis 1-2 Views  Result Date: 05/13/2017 CLINICAL DATA:  [place] yesterday now with low back pain EXAM: PELVIS - 1-2 VIEW COMPARISON:  None FINDINGS: There is mild degenerative joint disease with some loss of joint space bilaterally. No hip fracture is seen. There appears to be and old left inferior pubic ramus fracture but clinical correlation is recommended. Also there is a faint lucency through the right pubic ramus most likely overlapping, but a subtle fracture cannot be excluded. The pubic ramus is in normal position. The SI joints are corticated. IMPRESSION: 1. Probable old fracture of the left inferior pelvic ramus. Correlate clinically. 2. Vague lucency through the right pubic ramus may be due to overlapping structures, but correlate  clinically or consider CT of the pelvis to assess these areas further. Electronically Signed   By: Ivar Drape M.D.   On: 05/13/2017 12:10   Dg Shoulder Right  Result Date: 05/13/2017 CLINICAL DATA:  Recent fall, back pain EXAM: RIGHT SHOULDER - 2+ VIEW COMPARISON:  Chest x-ray of 05/13/2017 and 05/12/2017 FINDINGS: Fracture separation of the right [facility] joint is noted. There is a significant degenerative joint disease of the right glenohumeral joint with loss of joint space and spurring. Cortical discontinuity of the posterior right seventh rib is noted which could represent fracture or old deformity. IMPRESSION: 1. Fracture/separation of the right [facility] joint which may be acute. Correlate clinically. 2. 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 Electronically Signed   By: Ivar Drape M.D.   On: 05/13/2017 10:29   Ct Head Wo Contrast  Result Date: 05/13/2017 CLINICAL DATA:  Fall, posterior skull injury, hematoma.  Neck pain. EXAM: CT HEAD WITHOUT CONTRAST CT CERVICAL SPINE WITHOUT CONTRAST TECHNIQUE: Multidetector CT imaging of the head and cervical spine was performed following the standard protocol without intravenous contrast. Multiplanar CT image reconstructions of the cervical spine were also generated. COMPARISON:  None. FINDINGS: CT HEAD FINDINGS Brain: No evidence of acute infarction, hemorrhage, hydrocephalus, extra-axial collection or mass lesion/mass effect. Mild cortical atrophy. Subcortical white matter and periventricular small vessel ischemic changes. Vascular: Intracranial atherosclerosis. Skull: Normal. Negative for fracture or focal lesion. Sinuses/Orbits: Partial opacification of the bilateral ethmoid and sphenoid sinuses. Opacification of the right mastoid air cells. Other: Mild soft tissue swelling/hematoma overlying  the right posterior vertex. CT CERVICAL SPINE FINDINGS Alignment: Normal cervical lordosis. Skull base and vertebrae: No acute fracture. No primary  bone lesion or focal pathologic process. Soft tissues and spinal canal: No prevertebral fluid or swelling. No visible canal hematoma. Disc levels:  Mild degenerative changes of the mid cervical spine. Spinal canal is patent. Upper chest: Visualized lung apices are notable for biapical pleural-parenchymal scarring. Other: Visualized thyroid is unremarkable. IMPRESSION: Mild soft tissue swelling/hematoma overlying the right posterior vertex. No evidence of calvarial fracture. No evidence of acute intracranial abnormality. Atrophy with small vessel ischemic changes. Opacification of the right mastoid air cells. No evidence of traumatic injury to the cervical spine. Electronically Signed   By: Julian Hy M.D.   On: 05/13/2017 11:28   Ct Cervical Spine Wo Contrast  Result Date: 05/13/2017 CLINICAL DATA:  Fall, posterior skull injury, hematoma.  Neck pain. EXAM: CT HEAD WITHOUT CONTRAST CT CERVICAL SPINE WITHOUT CONTRAST TECHNIQUE: Multidetector CT imaging of the head and cervical spine was performed following the standard protocol without intravenous contrast. Multiplanar CT image reconstructions of the cervical spine were also generated. COMPARISON:  None. FINDINGS: CT HEAD FINDINGS Brain: No evidence of acute infarction, hemorrhage, hydrocephalus, extra-axial collection or mass lesion/mass effect. Mild cortical atrophy. Subcortical white matter and periventricular small vessel ischemic changes. Vascular: Intracranial atherosclerosis. Skull: Normal. Negative for fracture or focal lesion. Sinuses/Orbits: Partial opacification of the bilateral ethmoid and sphenoid sinuses. Opacification of the right mastoid air cells. Other: Mild soft tissue swelling/hematoma overlying the right posterior vertex. CT CERVICAL SPINE FINDINGS Alignment: Normal cervical lordosis. Skull base and vertebrae: No acute fracture. No primary bone lesion or focal pathologic process. Soft tissues and spinal canal: No prevertebral fluid or  swelling. No visible canal hematoma. Disc levels:  Mild degenerative changes of the mid cervical spine. Spinal canal is patent. Upper chest: Visualized lung apices are notable for biapical pleural-parenchymal scarring. Other: Visualized thyroid is unremarkable. IMPRESSION: Mild soft tissue swelling/hematoma overlying the right posterior vertex. No evidence of calvarial fracture. No evidence of acute intracranial abnormality. Atrophy with small vessel ischemic changes. Opacification of the right mastoid air cells. No evidence of traumatic injury to the cervical spine. Electronically Signed   By: Julian Hy M.D.   On: 05/13/2017 11:28   Ct Lumbar Spine Wo Contrast  Result Date: 05/13/2017 CLINICAL DATA:  Status post fall yesterday.  Back pain. EXAM: CT LUMBAR SPINE WITHOUT CONTRAST TECHNIQUE: Multidetector CT imaging of the lumbar spine was performed without intravenous contrast administration. Multiplanar CT image reconstructions were also generated. COMPARISON:  None. FINDINGS: Segmentation: 5 lumbar type vertebrae. Alignment: Normal. Vertebrae: Generalized osteopenia. L1 compression fracture with approximately 50% central height loss. 3 mm retropulsion of the posterior inferior margin of L1 vertebral body with minimal impression on the thecal sac. Fracture does not extend into the pedicles or posterior elements. Remainder the vertebral body heights are maintained. No aggressive osseous lesion. No discitis or osteomyelitis. Paraspinal and other soft tissues: No focal paraspinal abnormality. Abdominal aortic atherosclerosis. Other: Osteoarthritis of bilateral sacroiliac joints. 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 Bilateral neural foramina are patent. Bilateral facet arthropathy at L1-2, L2-3, and L3-4. IMPRESSION: 1. Acute L1 vertebral body compression fracture with approximately 50% central height loss. Electronically Signed   By: Kathreen Devoid   On:  05/13/2017 13:28     ASSESSMENT AND PLAN:    77 year old female with history of rheumatoid arthritis and remote hx of cardiomyopathy (  [place] with improved ef over time) who presented after mechanical fall.   1. Acute L1 fracture: Patient has been evaluated by neurosurgery surgical services. Recommendations are for TLSO brace and physical therapy consultation. Patient is not a candidate for surgery due to her comorbidities/osteoporosis Patient will need outpatient follow-up with neurosurgery after discharge  2. Acute right clavicular fracture with dislocated before meals joint: Continue pain medications Orthopedic surgery consultation requested  3. Asthma exacerbation, mild intermittent due to influenza A Wean IV steroids Continue Tamiflu for influenza a  Continue inhalers and DuoNeb's  4. Rheumatoid arthritis: Continue Plaquenil 5. Depression Continue Remeron`  6. hyponatremia: Start normal saline at 75 cc an hour and follow BMP for a.m.  7. Hyperlipidemia: Continue statin  PT evaluation for discharge planning  Management plans discussed with the patient and she is in agreement.  CODE STATUS: full  TOTAL TIME TAKING CARE OF THIS PATIENT: 30 minutes.     POSSIBLE D/C tomorrow, DEPENDING ON CLINICAL CONDITION.   Jeweldean Drohan M.D on 05/14/2017 at 9:00 AM  Between 7am to 6pm - Pager - [phone] After 6pm go to [email or web address] - password [email or web address] [facility]  Office  [phone]  CC: Primary care physician; Remi Haggard, FNP  Note: This dictation was prepared with Dragon dictation along with smaller phrase technology. Any transcriptional errors that result from this process are unintentional.

## 2017-05-14 NOTE — Evaluation (Signed)
Physical Therapy Evaluation Patient Details Name: Linda Walsh MRN: [id number] DOB: 1940-04-10 Today's Date: 05/14/2017   History of Present Illness  77 y.o. female status post fall, patient was not using her cane. after fall she complained of severe right shoulder and back pain, L1 fracture and R clavicular fx.  Clinical Impression  Pt is willing to participate with PT but ultimately showed considerable functional limitation with standing, walking, activities.  She had pain in R shoulder and back with mobility but this was not a big limiter.  She was able to do a little close to bed mobilty (gettting to/from [facility]) rather cautiously and slowly.  She showed great effort but struggled to not use R UE (in sling t/o) and displayed guarded posture/movement even with the TLSO donned.  Pt was confident that she will have the assist she needs at the group home and does not even want to consider rehab.      Follow Up Recommendations Home health PT    Equipment Recommendations       Recommendations for Other Services       Precautions / Restrictions Precautions Precautions: Fall Required Braces or Orthoses: Sling Restrictions RUE Weight Bearing: Non weight bearing(orders say sling)      Mobility  Bed Mobility Overal bed mobility: Needs Assistance Bed Mobility: Supine to Sit;Sit to Supine     Supine to sit: Min guard Sit to supine: Min assist   General bed mobility comments: Pt was able to use L UE and momentum to get herself to sitting EOB w/o assist.  Pt did need assist to get LEs back up into bed post session.    Transfers Overall transfer level: Needs assistance Equipment used: Straight cane(TLSO donned) Transfers: Sit to/from Stand Sit to Stand: Min assist         General transfer comment: Pt was able to rise to standing w/o heavy assist, but was leaning back   Ambulation/Gait Ambulation/Gait assistance: Min assist Ambulation Distance (Feet): 5 Feet Assistive device:  Straight cane       General Gait Details: Pt is able to do some very limited ambulation in the room but had very little abitlity to take any steps, showed poor confidence and needed light assist to keep herself balanced.  She ultimately felt poorly, light headed and could not do more than minimal transfers to/from [place]            Wheelchair Mobility    Modified Rankin (Stroke Patients Only)       Balance Overall balance assessment: Needs assistance Sitting-balance support: Single extremity supported Sitting balance-Leahy Scale: Good Sitting balance - Comments: Pt able to maintain sitting balance w/o assist                                     Pertinent Vitals/Pain Pain Assessment: 0-10 Pain Score: 7  Pain Location: R shoulder and mid back    Home Living Family/patient expects to be discharged to:: Group home                 Additional Comments: Pt reports she is able to get out with some regularity, occasionally does not use SPC in home    Prior Function Level of Independence: Independent with assistive device(s)         Comments: states she does not typically need a lot of assist in the group home  Hand Dominance        Extremity/Trunk Assessment   Upper Extremity Assessment Upper Extremity Assessment: Defer to OT evaluation(R UE in sling, she still used it occasionally)    Lower Extremity Assessment Lower Extremity Assessment: Generalized weakness       Communication   Communication: No difficulties  Cognition Arousal/Alertness: Awake/alert Behavior During Therapy: WFL for tasks assessed/performed;Anxious Overall Cognitive Status: Within Functional Limits for tasks assessed                                        General Comments      Exercises     Assessment/Plan    PT Assessment Patient needs continued PT services  PT Problem List Decreased strength;Decreased range of motion;Decreased  activity tolerance;Decreased balance;Decreased mobility;Decreased safety awareness;Decreased knowledge of use of DME;Decreased knowledge of precautions;Decreased cognition;Decreased coordination;Pain       PT Treatment Interventions DME instruction;Gait training;Functional mobility training;Therapeutic activities;Therapeutic exercise;Balance training;Neuromuscular re-education;Patient/family education    PT Goals (Current goals can be found in the Care Plan section)  Acute Rehab PT Goals Patient Stated Goal: Go back to the group home PT Goal Formulation: With patient Time For Goal Achievement: 05/28/17 Potential to Achieve Goals: Fair    Frequency 7X/week   Barriers to discharge        Co-evaluation               AM-PAC PT "6 Clicks" Daily Activity  Outcome Measure Difficulty turning over in bed (including adjusting bedclothes, sheets and blankets)?: A Little Difficulty moving from lying on back to sitting on the side of the bed? : A Little Difficulty sitting down on and standing up from a chair with arms (e.g., wheelchair, bedside commode, etc,.)?: Unable Help needed moving to and from a bed to chair (including a wheelchair)?: A Lot Help needed walking in hospital room?: Total Help needed climbing 3-5 steps with a railing? : Total 6 Click Score: 11    End of Session Equipment Utilized During Treatment: Gait belt Activity Tolerance: Patient tolerated treatment well Patient left: with bed alarm set;with call bell/phone within reach Nurse Communication: Mobility status PT Visit Diagnosis: Muscle weakness (generalized) (M62.81);Difficulty in walking, not elsewhere classified (R26.2);Pain Pain - Right/Left: Right Pain - part of body: Shoulder    Time: 1438-8875 PT Time Calculation (min) (ACUTE ONLY): 22 min   Charges:   PT Evaluation $PT Eval Low Complexity: 1 Low     PT G Codes:        Kreg Shropshire, DPT 05/14/2017, 4:05 PM

## 2017-05-14 NOTE — Progress Notes (Addendum)
Initial Nutrition Assessment  DOCUMENTATION CODES:   Not applicable  INTERVENTION:   Recommend monitor K, Mg, and P labs as pt at refeeding risk.   Ensure Enlive po BID, each supplement provides 350 kcal and 20 grams of protein  Magic cup TID with meals, each supplement provides 290 kcal and 9 grams of protein  Vitamin D 1000IU daily   Recommend Oscal with D po BID   NUTRITION DIAGNOSIS:   Increased nutrient needs related to other (see comment)(lumbar fracture) as evidenced by increased estimated needs from protein, vitamin D, and calcium.  GOAL:   Patient will meet greater than or equal to 90% of their needs  MONITOR:   PO intake, Supplement acceptance, Labs, Weight trends, Skin, I & O's  REASON FOR ASSESSMENT:   Consult Assessment of nutrition requirement/status  ASSESSMENT:   77 year old female with history of rheumatoid arthritis and remote hx of cardiomyopathy who presented after mechanical fall. Pt found to have L1 and clavicle fractures   Met with pt in room today. Pt reports poor appetite and oral intake for the past month. Pt reports her appetite is still poor today. Pt eating only bites of meals. Pt reports that she does like Ensure, vanilla and chocolate. Per chart, pt appears to have lost 5lbs(5%) over the past 2 weeks; this would be considered significant weight loss. RD will order supplements and MVI to help pt meet her estimated needs. Pt noted to have severe osteoporosis and is not a surgical candidate. Neurosurgery recommending TLSO brace and possible kyphoplasty in the future. Recommend vitamin D and calcium supplementation to encourage bone health. Recommend monitor K, Mg, and P labs as pt at refeeding risk.  Medications reviewed and include: aspirin, vit D, colace, lovenox, ferrous sulfate, melatonin, solu-medrol, mirtazapine, protonix, senokot, NaCl _0 /hr, Mg sulfate   Labs reviewed: Na 128(L), K 4.0 wnl, Cl 95(L), Ca 8.1(L), Mg 1.7  wnl  Nutrition-Focused physical exam completed. Findings are mild fat depletions, mild muscle depletions, and no edema.   Diet Order:  Diet regular Room service appropriate? Yes; Fluid consistency: Thin  EDUCATION NEEDS:   Education needs have been addressed  Skin:  Reviewed RN Assessment  Last BM:  2/3  Height:   Ht Readings from Last 1 Encounters:  05/13/17 _1  (1.422 m)    Weight:   Wt Readings from Last 1 Encounters:  05/13/17 105 lb 9.6 oz (47.9 kg)    Ideal Body Weight:  42.5 kg  BMI:  Body mass index is 23.68 kg/m.  Estimated Nutritional Needs:   Kcal:  1000-1200kcal/day   Protein:  57-67g/day   Fluid:  >1L/day   Koleen Distance MS, RD, LDN Pager #[phone] After Hours Pager: [phone]

## 2017-05-14 NOTE — NC FL2 (Signed)
[place] LEVEL OF CARE SCREENING TOOL     IDENTIFICATION  Patient Name: Linda Walsh Birthdate: Aug 07, 1940 Sex: female Admission Date (Current Location): 05/13/2017  [place] and [place] Number:  Engineering geologist and Address:  [facility], [address] [place], [place] [address]      Provider Number: [id number]  Attending Physician Name and Address:  Bettey Costa, MD  Relative Name and Phone Number:       Current Level of Care: Hospital Recommended Level of Care: Family Care Home([facility]) Prior Approval Number:    Date Approved/Denied:   PASRR Number:    Discharge Plan: Domiciliary (Rest home)([facility])    Current Diagnoses: Patient Active Problem List   Diagnosis Date Noted  . L1 vertebral fracture (HCC) 05/13/2017    Orientation RESPIRATION BLADDER Height & Weight     Self, Time, Situation, Place  O2(1L Nasal Cannula) Incontinent Weight: 105 lb 9.6 oz (47.9 kg) Height:  4\' 8"  (142.2 cm)  BEHAVIORAL SYMPTOMS/MOOD NEUROLOGICAL BOWEL NUTRITION STATUS      Continent Diet(Regular)  AMBULATORY STATUS COMMUNICATION OF NEEDS Skin   Limited Assist Verbally Normal                       Personal Care Assistance Level of Assistance  Bathing, Feeding, Dressing Bathing Assistance: Limited assistance Feeding assistance: Independent Dressing Assistance: Limited assistance     Functional Limitations Info  Sight, Hearing, Speech Sight Info: Adequate Hearing Info: Impaired Speech Info: Adequate    SPECIAL CARE FACTORS FREQUENCY  PT (By licensed PT)     PT Frequency: (2-3 Home Health)              Contractures      Additional Factors Info  Code Status, Allergies, Isolation Precautions Code Status Info: (Full Code) Allergies Info: (No Known Allergies)     Isolation Precautions Info: (Flu positive)     Current Medications (05/14/2017):  This is the current hospital active medication  list Current Facility-Administered Medications  Medication Dose Route Frequency Provider Last Rate Last Dose  . 0.9 %  sodium chloride infusion   Intravenous Continuous Mody, Sital, MD      . acetaminophen (TYLENOL) tablet 650 mg  650 mg Oral Q6H PRN Salary, Montell D, MD       Or  . acetaminophen (TYLENOL) suppository 650 mg  650 mg Rectal Q6H PRN Salary, Montell D, MD      . albuterol (PROVENTIL) (2.5 MG/3ML) 0.083% nebulizer solution 2.5 mg  2.5 mg Nebulization Q2H PRN Salary, Montell D, MD      . aspirin EC tablet 81 mg  81 mg Oral Daily Salary, Montell D, MD      . atorvastatin (LIPITOR) tablet 20 mg  20 mg Oral Daily Salary, Montell D, MD      . bisacodyl (DULCOLAX) suppository 10 mg  10 mg Rectal Daily PRN Salary, Montell D, MD      . budesonide (PULMICORT) nebulizer solution 0.5 mg  2 mL Nebulization Daily Salary, Montell D, MD   0.5 mg at 05/14/17 0754  . cholecalciferol (VITAMIN D) tablet 1,000 Units  1,000 Units Oral Daily Salary, Montell D, MD      . docusate sodium (COLACE) capsule 100 mg  100 mg Oral BID Loney Hering D, MD   100 mg at 05/13/17 2158  . enoxaparin (LOVENOX) injection 40 mg  40 mg Subcutaneous Q24H Salary, Avel Peace, MD   40  mg at 05/13/17 2159  . ferrous sulfate tablet 325 mg  325 mg Oral BID Loney Hering D, MD   325 mg at 05/13/17 2157  . guaiFENesin (MUCINEX) 12 hr tablet 600 mg  600 mg Oral BID Loney Hering D, MD   600 mg at 05/13/17 2157  . HYDROcodone-acetaminophen (NORCO/VICODIN) 5-325 MG per tablet 1 tablet  1 tablet Oral Q4H PRN Poggi, Marshall Cork, MD   1 tablet at 05/13/17 2213  . hydroxychloroquine (PLAQUENIL) tablet 400 mg  400 mg Oral Daily Salary, Montell D, MD      . ipratropium-albuterol (DUONEB) 0.5-2.5 (3) MG/3ML nebulizer solution 3 mL  3 mL Nebulization QID Salary, Holly Bodily D, MD   3 mL at 05/14/17 0754  . loratadine (CLARITIN) tablet 10 mg  10 mg Oral Daily Salary, Montell D, MD      . LORazepam (ATIVAN) tablet 1 mg  1 mg Oral QHS Salary,  Montell D, MD   1 mg at 05/13/17 2157  . Melatonin TABS 5 mg  5 mg Oral QHS Salary, Montell D, MD   5 mg at 05/13/17 2158  . methylPREDNISolone sodium succinate (SOLU-MEDROL) 125 mg/2 mL injection 125 mg  125 mg Intravenous Once Salary, Montell D, MD      . methylPREDNISolone sodium succinate (SOLU-MEDROL) 40 mg/mL injection 40 mg  40 mg Intravenous Q8H Mody, Sital, MD      . mirtazapine (REMERON) tablet 30 mg  30 mg Oral QHS Salary, Montell D, MD   30 mg at 05/13/17 2157  . morphine 2 MG/ML injection 2 mg  2 mg Intravenous Q2H PRN Salary, Montell D, MD      . ondansetron (ZOFRAN) tablet 4 mg  4 mg Oral Q6H PRN Salary, Montell D, MD       Or  . ondansetron (ZOFRAN) injection 4 mg  4 mg Intravenous Q6H PRN Salary, Montell D, MD      . oseltamivir (TAMIFLU) capsule 30 mg  30 mg Oral BID Hillary Bow, MD   30 mg at 05/13/17 2158  . pantoprazole (PROTONIX) EC tablet 40 mg  40 mg Oral Daily Salary, Montell D, MD      . polyethylene glycol (MIRALAX / GLYCOLAX) packet 17 g  17 g Oral Daily PRN Salary, Montell D, MD      . senna-docusate (Senokot-S) tablet 1 tablet  1 tablet Oral BID Gorden Harms, MD   1 tablet at 05/13/17 2157  . sodium phosphate (FLEET) 7-19 GM/118ML enema 1 enema  1 enema Rectal Once PRN Salary, Montell D, MD      . triamcinolone cream (KENALOG) 0.1 %   Topical BID PRN Salary, Avel Peace, MD         Discharge Medications: Please see discharge summary for a list of discharge medications.  Relevant Imaging Results:  Relevant Lab Results:   Additional Information (SSN: [id number])  Smith Mince, Student-Social Work

## 2017-05-15 LAB — BASIC METABOLIC PANEL
ANION GAP: 8 (ref 5–15)
BUN: 8 mg/dL (ref 6–20)
CHLORIDE: 97 mmol/L — AB (ref 101–111)
CO2: 25 mmol/L (ref 22–32)
Calcium: 7.6 mg/dL — ABNORMAL LOW (ref 8.9–10.3)
Creatinine, Ser: 0.41 mg/dL — ABNORMAL LOW (ref 0.44–1.00)
GFR calc Af Amer: >60 mL/min (ref >60)
GFR calc non Af Amer: >60 mL/min (ref >60)
GLUCOSE: 134 mg/dL — AB (ref 65–99)
POTASSIUM: 2.8 mmol/L — AB (ref 3.5–5.1)
Sodium: 130 mmol/L — ABNORMAL LOW (ref 135–145)

## 2017-05-15 LAB — PHOSPHORUS: Phosphorus: 1.9 mg/dL — ABNORMAL LOW (ref 2.5–4.6)

## 2017-05-15 MED ORDER — K PHOS MONO-SOD PHOS DI & MONO 155-852-130 MG PO TABS
500.0000 mg | ORAL_TABLET | ORAL | Status: DC
  Filled 2017-05-15 (×3): qty 2

## 2017-05-15 MED ORDER — MUPIROCIN 2 % EX OINT: 1.0000 "application " | TOPICAL_OINTMENT | Freq: Two times a day (BID) | CUTANEOUS | 0 refills | Status: DC

## 2017-05-15 MED ORDER — POTASSIUM CHLORIDE CRYS ER 20 MEQ PO TBCR: 40.0000 meq | EXTENDED_RELEASE_TABLET | Freq: Once | ORAL | Status: DC

## 2017-05-15 MED ORDER — PREDNISONE 20 MG PO TABS: 40.0000 mg | ORAL_TABLET | Freq: Every day | ORAL | 0 refills | Status: AC

## 2017-05-15 MED ORDER — TRAMADOL HCL 50 MG PO TABS: 50.0000 mg | ORAL_TABLET | Freq: Four times a day (QID) | ORAL | 0 refills | Status: DC | PRN

## 2017-05-15 MED ORDER — ENSURE ENLIVE PO LIQD: 237.0000 mL | Freq: Two times a day (BID) | ORAL | 12 refills | Status: DC

## 2017-05-15 MED ORDER — LACTULOSE 10 GM/15ML PO SOLN
20.0000 g | ORAL | Status: DC
  Administered 2017-05-15: 20 g via ORAL
  Filled 2017-05-15: qty 30

## 2017-05-15 MED ORDER — OSELTAMIVIR PHOSPHATE 30 MG PO CAPS: 30.0000 mg | ORAL_CAPSULE | Freq: Two times a day (BID) | ORAL | 0 refills | Status: AC

## 2017-05-15 MED ORDER — CALCIUM CARBONATE-VITAMIN D 500-200 MG-UNIT PO TABS: 1.0000 | ORAL_TABLET | Freq: Two times a day (BID) | ORAL | 0 refills | Status: DC

## 2017-05-15 NOTE — Progress Notes (Signed)
Physical Therapy Treatment Patient Details Name: Linda Walsh MRN: [id number] DOB: 1941/03/07 Today's Date: 05/15/2017    History of Present Illness 77 y.o. female status post fall, patient was not using her cane. after fall she complained of severe right shoulder and back pain, L1 fracture and R clavicular fx.    PT Comments    Pt was better able to participate with PT today, though she still fatigued quickly and was only able to ambulate ~25 ft.  She was more confident and steady with getting to standing and with ambulation.  Overall pt is not at her baseline but should be able to safely return to group home with some assist.  Pt refused wearing sling secondary to c/o pain.   Follow Up Recommendations  Home health PT     Equipment Recommendations       Recommendations for Other Services       Precautions / Restrictions Precautions Precautions: Fall Restrictions RUE Weight Bearing: Non weight bearing(orders for sling)    Mobility  Bed Mobility Overal bed mobility: Modified Independent Bed Mobility: Supine to Sit;Sit to Supine     Supine to sit: Min guard Sit to supine: Min guard   General bed mobility comments: Pt showed good effort with getting to/from sitting, overall she did well and did not need any direct assist  Transfers Overall transfer level: Modified independent Equipment used: Straight cane(TLSO ) Transfers: Sit to/from Stand Sit to Stand: Supervision         General transfer comment: Pt was able to rise to standing w/o assist, did need cues to not use the cane in the wrong hand  Ambulation/Gait Ambulation/Gait assistance: Min assist Ambulation Distance (Feet): 25 Feet Assistive device: Straight cane       General Gait Details: Pt was able to so some in-room walking though she did fatigue very quickly with the effort.  Her O2 remained in the 90s on room air, but her HR increased to 130s    Stairs            Wheelchair Mobility     Modified Rankin (Stroke Patients Only)       Balance Overall balance assessment: Needs assistance Sitting-balance support: Single extremity supported Sitting balance-Leahy Scale: Good     Standing balance support: Single extremity supported Standing balance-Leahy Scale: Fair Standing balance comment: Pt with no LOBs in standing or walking, but did lack confidence and had a few small stagger steps                            Cognition Arousal/Alertness: Awake/alert Behavior During Therapy: WFL for tasks assessed/performed Overall Cognitive Status: Within Functional Limits for tasks assessed                                        Exercises General Exercises - Lower Extremity Ankle Circles/Pumps: AROM;10 reps Long Arc Quad: Strengthening;5 reps Heel Slides: Strengthening;5 reps Hip ABduction/ADduction: Strengthening;10 reps Hip Flexion/Marching: AROM;10 reps    General Comments        Pertinent Vitals/Pain Pain Assessment: 0-10 Pain Score: 4     Home Living                      Prior Function            PT Goals (current goals can now be  found in the care plan section) Progress towards PT goals: Progressing toward goals    Frequency           PT Plan Current plan remains appropriate    Co-evaluation              AM-PAC PT "6 Clicks" Daily Activity  Outcome Measure  Difficulty turning over in bed (including adjusting bedclothes, sheets and blankets)?: A Little Difficulty moving from lying on back to sitting on the side of the bed? : A Little Difficulty sitting down on and standing up from a chair with arms (e.g., wheelchair, bedside commode, etc,.)?: A Little Help needed moving to and from a bed to chair (including a wheelchair)?: A Little Help needed walking in hospital room?: A Lot Help needed climbing 3-5 steps with a railing? : A Lot 6 Click Score: 16    End of Session Equipment Utilized During  Treatment: Gait belt Activity Tolerance: Patient limited by fatigue Patient left: with bed alarm set;with call bell/phone within reach Nurse Communication: Mobility status PT Visit Diagnosis: Muscle weakness (generalized) (M62.81);Difficulty in walking, not elsewhere classified (R26.2);Pain     Time: 4481-8563 PT Time Calculation (min) (ACUTE ONLY): 29 min  Charges:  $Gait Training: 8-22 mins $Therapeutic Exercise: 8-22 mins                    G Codes:       Kreg Shropshire, DPT 05/15/2017, 4:06 PM

## 2017-05-15 NOTE — Care Management (Signed)
Physical therapy evaluation completed. Recommending home with home health and physical therapy. Spoke with [facility] representative. No preference of home health agency.  Floydene Flock, [facility] representative updated, Discharge to home today per Dr, Benjie Karvonen Shelbie Ammons RN MSN CCM Care Management [phone]

## 2017-05-15 NOTE — Progress Notes (Signed)
Pt was picked up by group home.

## 2017-05-15 NOTE — NC FL2 (Signed)
[place] LEVEL OF CARE SCREENING TOOL     IDENTIFICATION  Patient Name: Linda Walsh Birthdate: Aug 28, 1940 Sex: female Admission Date (Current Location): 05/13/2017  [place] and [place] Number:  Engineering geologist and Address:  [facility], [address] [place], [place] [address]      Provider Number: [id number]  Attending Physician Name and Address:  Bettey Costa, MD  Relative Name and Phone Number:       Current Level of Care: Hospital Recommended Level of Care: Family Care Home([facility]) Prior Approval Number:    Date Approved/Denied:   PASRR Number:    Discharge Plan: Domiciliary (Rest home)([facility])    Current Diagnoses: Patient Active Problem List   Diagnosis Date Noted  . L1 vertebral fracture ([place]) 05/13/2017    Orientation RESPIRATION BLADDER Height & Weight     Self, Time, Situation, Place  Room Air  Incontinent Weight: 105 lb 9.6 oz (47.9 kg) Height:  4\' 8"  (142.2 cm)  BEHAVIORAL SYMPTOMS/MOOD NEUROLOGICAL BOWEL NUTRITION STATUS      Continent Diet(Regular)  AMBULATORY STATUS COMMUNICATION OF NEEDS Skin   Limited Assist Verbally Normal                       Personal Care Assistance Level of Assistance  Bathing, Feeding, Dressing Bathing Assistance: Limited assistance Feeding assistance: Independent Dressing Assistance: Limited assistance     Functional Limitations Info  Sight, Hearing, Speech Sight Info: Adequate Hearing Info: Impaired Speech Info: Adequate    SPECIAL CARE FACTORS FREQUENCY  PT (By licensed PT)     PT Frequency: (2-3 Home Health)              Contractures      Additional Factors Info  Code Status, Allergies, Isolation Precautions Code Status Info: (Full Code) Allergies Info: (No Known Allergies)     Isolation Precautions Info: (Flu positive)    Discharge Medications: Please see discharge summary for a list of discharge medications. Medication  List     STOP taking these medications   azithromycin 250 MG tablet Commonly known as:  ZITHROMAX     TAKE these medications   acetaminophen 500 MG tablet Commonly known as:  TYLENOL Take 500 mg by mouth 2 (two) times daily.   aspirin EC 81 MG tablet Take 81 mg by mouth daily.   atorvastatin 20 MG tablet Commonly known as:  LIPITOR Take 20 mg by mouth daily.   BELSOMRA 10 MG Tabs Generic drug:  Suvorexant Take 15 mg by mouth at bedtime.   budesonide 0.5 MG/2ML nebulizer solution Commonly known as:  PULMICORT Take 2 mLs by nebulization daily.   calcium-vitamin D 500-200 MG-UNIT tablet Commonly known as:  OSCAL WITH D Take 1 tablet by mouth 2 (two) times daily.   cetirizine 10 MG tablet Commonly known as:  ZYRTEC Take 10 mg by mouth daily.   COMBIVENT RESPIMAT IN Inhale 1 puff into the lungs every 4 (four) hours as needed.   feeding supplement (ENSURE ENLIVE) Liqd Take 237 mLs by mouth 2 (two) times daily between meals.   ferrous sulfate 325 (65 FE) MG tablet Take 325 mg by mouth 2 (two) times daily.   HYDROcodone-acetaminophen 5-325 MG tablet Commonly known as:  NORCO/VICODIN Take 1 tablet by mouth 2 (two) times daily.   hydroxychloroquine 200 MG tablet Commonly known as:  PLAQUENIL Take 400 mg by mouth daily.   LORazepam 1 MG tablet Commonly known as:  ATIVAN Take 1 mg by mouth at bedtime.   mirtazapine 30 MG tablet Commonly known as:  REMERON Take 30 mg by mouth at bedtime.   mupirocin ointment 2 % Commonly known as:  BACTROBAN Place 1 application into the nose 2 (two) times daily.   oseltamivir 30 MG capsule Commonly known as:  TAMIFLU Take 1 capsule (30 mg total) by mouth 2 (two) times daily for 3 days.   pantoprazole 40 MG tablet Commonly known as:  PROTONIX Take 1 tablet by mouth daily.   predniSONE 20 MG tablet Commonly known as:  DELTASONE Take 2 tablets (40 mg total) by mouth daily with breakfast for 4 days.    PROAIR HFA 108 (90 Base) MCG/ACT inhaler Generic drug:  albuterol Inhale 2 puffs into the lungs every 4 (four) hours as needed.   senna-docusate 8.6-50 MG tablet Commonly known as:  Senokot-S Take 1 tablet by mouth 2 (two) times daily.   traMADol 50 MG tablet Commonly known as:  ULTRAM Take 1 tablet (50 mg total) by mouth every 6 (six) hours as needed.   triamcinolone cream 0.1 % Commonly known as:  KENALOG Apply 0.1 % topically 2 (two) times daily as needed.   VITAMIN D-1000 MAX ST 1000 units tablet Generic drug:  Cholecalciferol Take 1,000 mg by mouth daily.     Relevant Imaging Results: Relevant Lab Results: Additional Information (SSN: [id number])  Diante Barley, Veronia Beets, LCSW

## 2017-05-15 NOTE — Progress Notes (Signed)
Patient is medically stable for D/C back to [facility] group home today. Clinical Education officer, museum (CSW) contacted group home owner Marcelline Mates [phone] and made her aware that patient is positive for the flu, on a Dysphagia level 2(MINCED w/ gravy); thin liquids. Aspiration precautions diet, and that patient is limited with PT. Per [place] patient can return to the group home and they will provide transport. Per Marcelline Mates they do not have a preference for a home health agency. RN case manager will arrange home health PT and OT. RN will call [facility] staff a group home when patient is ready for D/C. Patient is aware of above. Please reconsult if future social work needs arise. CSW signing off.   [employer], LCSW [phone]

## 2017-05-15 NOTE — Care Management Important Message (Signed)
Important Message  Patient Details  Name: Linda Walsh MRN: [id number] Date of Birth: 12/18/1940   Medicare Important Message Given:  Yes    Shelbie Ammons, RN 05/15/2017, 6:47 AM

## 2017-05-15 NOTE — NC FL2 (Addendum)
[place] LEVEL OF CARE SCREENING TOOL     IDENTIFICATION  Patient Name: Linda Walsh Birthdate: 07/30/1940 Sex: female Admission Date (Current Location): 05/13/2017  [place] and [place] Number:  Engineering geologist and Address:  [facility], [address] [place], [place] [address]      Provider Number: [id number]  Attending Physician Name and Address:  Bettey Costa, MD  Relative Name and Phone Number:       Current Level of Care: Hospital Recommended Level of Care: Family Care Home([facility]) Prior Approval Number:    Date Approved/Denied:   PASRR Number:    Discharge Plan: Domiciliary (Rest home)([facility])    Current Diagnoses: Patient Active Problem List   Diagnosis Date Noted  . L1 vertebral fracture ([place]) 05/13/2017    Orientation RESPIRATION BLADDER Height & Weight     Self, Time, Situation, Place  Room Air  Incontinent Weight: 105 lb 9.6 oz (47.9 kg) Height:  4\' 8"  (142.2 cm)  BEHAVIORAL SYMPTOMS/MOOD NEUROLOGICAL BOWEL NUTRITION STATUS      Continent Dysphagia level 2(MINCED w/ gravy); thin liquids. Aspiration precautions  AMBULATORY STATUS COMMUNICATION OF NEEDS Skin   Extensive Assistance Verbally Normal                       Personal Care Assistance Level of Assistance  Bathing, Feeding, Dressing Bathing Assistance: Limited assistance Feeding assistance: Independent Dressing Assistance: Limited assistance     Functional Limitations Info  Sight, Hearing, Speech Sight Info: Adequate Hearing Info: Impaired Speech Info: Adequate    SPECIAL CARE FACTORS FREQUENCY  PT (By licensed PT)     PT Frequency: (2-3 Home Health)              Contractures      Additional Factors Info  Code Status, Allergies, Isolation Precautions Code Status Info: (Full Code) Allergies Info: (No Known Allergies)     Isolation Precautions Info: (Flu positive)    Discharge Medications: Please see  discharge summary for a list of discharge medications. Medication List     STOP taking these medications   azithromycin 250 MG tablet Commonly known as:  ZITHROMAX   HYDROcodone-acetaminophen 5-325 MG tablet Commonly known as:  NORCO/VICODIN     TAKE these medications   acetaminophen 500 MG tablet Commonly known as:  TYLENOL Take 500 mg by mouth 2 (two) times daily.   aspirin EC 81 MG tablet Take 81 mg by mouth daily.   atorvastatin 20 MG tablet Commonly known as:  LIPITOR Take 20 mg by mouth daily.   BELSOMRA 10 MG Tabs Generic drug:  Suvorexant Take 15 mg by mouth at bedtime.   budesonide 0.5 MG/2ML nebulizer solution Commonly known as:  PULMICORT Take 2 mLs by nebulization daily.   calcium-vitamin D 500-200 MG-UNIT tablet Commonly known as:  OSCAL WITH D Take 1 tablet by mouth 2 (two) times daily.   cetirizine 10 MG tablet Commonly known as:  ZYRTEC Take 10 mg by mouth daily.   COMBIVENT RESPIMAT IN Inhale 1 puff into the lungs every 4 (four) hours as needed.   feeding supplement (ENSURE ENLIVE) Liqd Take 237 mLs by mouth 2 (two) times daily between meals.   ferrous sulfate 325 (65 FE) MG tablet Take 325 mg by mouth 2 (two) times daily.   hydroxychloroquine 200 MG tablet Commonly known as:  PLAQUENIL Take 400 mg by mouth daily.   LORazepam 1 MG tablet Commonly known as:  ATIVAN Take 1 mg by mouth at bedtime.   mirtazapine 30 MG tablet Commonly known as:  REMERON Take 30 mg by mouth at bedtime.   mupirocin ointment 2 % Commonly known as:  BACTROBAN Place 1 application into the nose 2 (two) times daily.   oseltamivir 30 MG capsule Commonly known as:  TAMIFLU Take 1 capsule (30 mg total) by mouth 2 (two) times daily for 3 days.   pantoprazole 40 MG tablet Commonly known as:  PROTONIX Take 1 tablet by mouth daily.   predniSONE 20 MG tablet Commonly known as:  DELTASONE Take 2 tablets (40 mg total) by mouth daily with  breakfast for 4 days.   PROAIR HFA 108 (90 Base) MCG/ACT inhaler Generic drug:  albuterol Inhale 2 puffs into the lungs every 4 (four) hours as needed.   senna-docusate 8.6-50 MG tablet Commonly known as:  Senokot-S Take 1 tablet by mouth 2 (two) times daily.   traMADol 50 MG tablet Commonly known as:  ULTRAM Take 1 tablet (50 mg total) by mouth every 6 (six) hours as needed.   triamcinolone cream 0.1 % Commonly known as:  KENALOG Apply 0.1 % topically 2 (two) times daily as needed.   VITAMIN D-1000 MAX ST 1000 units tablet Generic drug:  Cholecalciferol Take 1,000 mg by mouth daily.  Relevant Imaging Results: Relevant Lab Results: Additional Information (SSN: [id number])  Xela Oregel, Veronia Beets, LCSW

## 2017-05-15 NOTE — Progress Notes (Signed)
Pt given suppository for bm w/o positive results.  Notified MD. Orders to give enema and continue with discharge after bm.

## 2017-05-15 NOTE — Evaluation (Signed)
Clinical/Bedside Swallow Evaluation Patient Details  Name: Linda Walsh MRN: [id number] Date of Birth: 06-19-1940  Today's Date: 05/15/2017 Time: SLP Start Time (ACUTE ONLY): 0835 SLP Stop Time (ACUTE ONLY): 0930 SLP Time Calculation (min) (ACUTE ONLY): 55 min  Past Medical History:  Past Medical History:  Diagnosis Date  . Arthritis   . CHF (congestive heart failure) ([place])   . Depressed   . Renal disorder    Past Surgical History: History reviewed. No pertinent surgical history. HPI:  Pt is a 77 y.o. female with a known history Dementia, CHF, arthritis, renal dis. status post mechanical fall on yesterday, patient was not using her cane as required, noted history of dementia, after fall patient noted to have severe right shoulder and back pain, ER workup noted for L1 fracture with retropulsion on x-ray-CT L-spine pending, right clavicular fracture with dislocation of the [facility] joint noted on x-ray, CT head noted for sinus disease, CT C-spine negative, sodium 124, potassium 3, chloride 86, EKG noted for right bundle branch block, patient with noted cough for 4-6 weeks, no better with treatment of course with Augmentin to 3 weeks ago, now on azithromycin-took first dose today, the patient evaluated in the emergency room caregiver at the bedside, patient complains of moderate to severe discomfort/pain in her back and right shoulder only, patient is now been admitted with acute L1 fracture with retropulsion, acute clavicular fracture with dislocated AC joint status post mechanical fall, acute asthmatic exacerbation. Pt has bronchitis w/ Asthma exacerbation, mild intermittent due to influenza A per MD notes.    Assessment / Plan / Recommendation Clinical Impression  Pt appears to present w/ adequate oropharyngeal phase swallowing function w/ no immediate, overt s/s of aspiration noted - vocal quality was clear b/t trials. Pt appeared easily fatigued w/ the exertion of the po trials so rest breaks were  given. Pt was also assisted w/ trials d/t arthritic hands. No significant change in respiratory status noted though pt pt was given rest breaks b/t trials to avoid SOB/WOB. Oral phase appeared wfl for bolus management of the trials given; appropriate bolus management, transfer and oral clearing w/ each trial. Pt declined most po's stating she "wanted to sleep". Pt does appear fatigued; congested breathing noted as well. Pt was left w/ HOB elevated for improved breathing; she immediately fell asleep. Due to pt's lacking bottom dentition and fatigue/illness, recommend a modified food diet to a Dysphagia level 2(minced) w/ thin liquids - NO Large straws. Strict aspiration precautions; Rest Breaks frequently to avoid SOB/WOB. Assistance at meals for setup and feeding d/t hands and the exertion/fatigue factor. Recommend Pills in Puree for safer swallowing at this time. NSG updated.  SLP Visit Diagnosis: Dysphagia, unspecified (R13.10)    Aspiration Risk  Mild aspiration risk(d/t respiratory status/illness; fatigue/weakness. Dementia) Reduced risk following aspiration precautions; assistance during meals   Diet Recommendation  Dysphagia level 2(MINCED w/ gravy); thin liquids. Aspiration precautions.   Medication Administration: Whole meds with puree    Other  Recommendations Recommended Consults: (dietician f/u) Oral Care Recommendations: Oral care BID;Patient independent with oral care;Staff/trained caregiver to provide oral care Other Recommendations: (n/a)   Follow up Recommendations Skilled Nursing facility(TBD)      Frequency and Duration min 2x/week  1 week       Prognosis Prognosis for Safe Diet Advancement: Fair(-Good) Barriers to Reach Goals: Cognitive deficits      Swallow Study   General Date of Onset: 05/13/17 HPI: Pt is a 77 y.o. female with a known  history Dementia, CHF, arthritis, renal dis. status post mechanical fall on yesterday, patient was not using her cane as required,  noted history of dementia, after fall patient noted to have severe right shoulder and back pain, ER workup noted for L1 fracture with retropulsion on x-ray-CT L-spine pending, right clavicular fracture with dislocation of the [facility] joint noted on x-ray, CT head noted for sinus disease, CT C-spine negative, sodium 124, potassium 3, chloride 86, EKG noted for right bundle branch block, patient with noted cough for 4-6 weeks, no better with treatment of course with Augmentin to 3 weeks ago, now on azithromycin-took first dose today, the patient evaluated in the emergency room caregiver at the bedside, patient complains of moderate to severe discomfort/pain in her back and right shoulder only, patient is now been admitted with acute L1 fracture with retropulsion, acute clavicular fracture with dislocated AC joint status post mechanical fall, acute asthmatic exacerbation. Pt has bronchitis w/ Asthma exacerbation, mild intermittent due to influenza A per MD notes.  Type of Study: Bedside Swallow Evaluation Previous Swallow Assessment: none reported Diet Prior to this Study: Regular;Thin liquids Temperature Spikes Noted: Yes(temp 99.8;  wbc 9.2) Respiratory Status: Room air History of Recent Intubation: No Behavior/Cognition: Alert;Cooperative;Pleasant mood;Requires cueing(baseline Dementia) Oral Cavity Assessment: Dry Oral Care Completed by SLP: Recent completion by staff Oral Cavity - Dentition: Dentures, top(no bottom dentition) Vision: Functional for self-feeding Self-Feeding Abilities: Able to feed self;Needs assist;Needs set up;Total assist(arthritic hands) Patient Positioning: Upright in bed Baseline Vocal Quality: Normal;Low vocal intensity Volitional Cough: Strong;Congested Volitional Swallow: Able to elicit    Oral/Motor/Sensory Function Overall Oral Motor/Sensory Function: Within functional limits   Ice Chips Ice chips: Within functional limits Presentation: Spoon(2 trials)   Thin Liquid Thin  Liquid: Within functional limits Presentation: Cup;Self Fed;Straw(min assist d/t hands) Other Comments: easily fatigued w/ exertion    Nectar Thick Nectar Thick Liquid: Not tested   Honey Thick Honey Thick Liquid: Not tested   Puree Puree: Within functional limits Presentation: Spoon(fed; 4 trials) Other Comments: pt declined more   Solid   GO   Solid: Not tested Other Comments: pt declined        Orinda Kenner, MS, CCC-SLP Watson,Katherine 05/15/2017,9:34 AM

## 2017-05-15 NOTE — Discharge Summary (Addendum)
[facility] [place] NAME: Linda Walsh    MR#:  [id number]  DATE OF BIRTH:  Jul 28, 1940  DATE OF ADMISSION:  05/13/2017 ADMITTING PHYSICIAN: Gorden Harms, MD  DATE OF DISCHARGE: 05/15/2017  PRIMARY CARE PHYSICIAN: Remi Haggard, FNP    ADMISSION DIAGNOSIS:  Hyponatremia [E87.1] Bronchitis [J40] Closed nondisplaced fracture of right clavicle, unspecified part of clavicle, initial encounter [S42.001A] Closed compression fracture of first lumbar vertebra, initial encounter ([place]) [S32.010A]  DISCHARGE DIAGNOSIS:  Active Problems:   L1 vertebral fracture ([place])   SECONDARY DIAGNOSIS:   Past Medical History:  Diagnosis Date  . Arthritis   . CHF (congestive heart failure) ([place])   . Depressed   . Renal disorder     HOSPITAL COURSE:  77 year old female with history of rheumatoid arthritis and remote hx of cardiomyopathy ( [place] with improved ef over time) who presented after mechanical fall.   1. Acute L1 fracture: Patient has been evaluated by neurosurgery surgical services. Recommendations are for TLSO brace and physical therapy consultation. Patient is not a candidate for surgery due to her comorbidities/osteoporosis Patient will need outpatient follow-up with neurosurgery after discharge  2. Acute mildly displaced right clavicular fracture with dislocated before meals joint: Patient evaluated by Dr. Roland Rack. At this point patient does not require surgical intervention especially given patient's significant osteoporosis and comorbidities. He is recommending patient remain in sling or shoulder immobilizer for the next several weeks for comfort. She may remove the sling for bathing purposes. She will follow-up with orthopedic surgery in 2 weeks.   3. Asthma exacerbation, mild intermittent due to influenza A Patient will need to continue oral steroids for 4 more days. Continue Tamiflu for influenza a stop date 05/18/2017   Continue inhalers and DuoNeb's  4. Rheumatoid arthritis: Continue Plaquenil  5. Depression Continue Remeron`  6. hyponatremia: This is improved with IV fluids. This is due to poor by mouth intake.  7. Hyperlipidemia: Continue statin  Physical therapy/occupational therapy has recommended home health   DISCHARGE CONDITIONS AND DIET:   Stable for discharge Regular diet  CONSULTS OBTAINED:  Treatment Team:  Meade Maw, MD  DRUG ALLERGIES:  No Known Allergies  DISCHARGE MEDICATIONS:   Allergies as of 05/15/2017   No Known Allergies     Medication List    STOP taking these medications   azithromycin 250 MG tablet Commonly known as:  ZITHROMAX   HYDROcodone-acetaminophen 5-325 MG tablet Commonly known as:  NORCO/VICODIN     TAKE these medications   acetaminophen 500 MG tablet Commonly known as:  TYLENOL Take 500 mg by mouth 2 (two) times daily.   aspirin EC 81 MG tablet Take 81 mg by mouth daily.   atorvastatin 20 MG tablet Commonly known as:  LIPITOR Take 20 mg by mouth daily.   BELSOMRA 10 MG Tabs Generic drug:  Suvorexant Take 15 mg by mouth at bedtime.   budesonide 0.5 MG/2ML nebulizer solution Commonly known as:  PULMICORT Take 2 mLs by nebulization daily.   calcium-vitamin D 500-200 MG-UNIT tablet Commonly known as:  OSCAL WITH D Take 1 tablet by mouth 2 (two) times daily.   cetirizine 10 MG tablet Commonly known as:  ZYRTEC Take 10 mg by mouth daily.   COMBIVENT RESPIMAT IN Inhale 1 puff into the lungs every 4 (four) hours as needed.   feeding supplement (ENSURE ENLIVE) Liqd Take 237 mLs by mouth 2 (two) times daily between meals.   ferrous sulfate  325 (65 FE) MG tablet Take 325 mg by mouth 2 (two) times daily.   hydroxychloroquine 200 MG tablet Commonly known as:  PLAQUENIL Take 400 mg by mouth daily.   LORazepam 1 MG tablet Commonly known as:  ATIVAN Take 1 mg by mouth at bedtime.   mirtazapine 30 MG tablet Commonly  known as:  REMERON Take 30 mg by mouth at bedtime.   mupirocin ointment 2 % Commonly known as:  BACTROBAN Place 1 application into the nose 2 (two) times daily.   oseltamivir 30 MG capsule Commonly known as:  TAMIFLU Take 1 capsule (30 mg total) by mouth 2 (two) times daily for 3 days.   pantoprazole 40 MG tablet Commonly known as:  PROTONIX Take 1 tablet by mouth daily.   predniSONE 20 MG tablet Commonly known as:  DELTASONE Take 2 tablets (40 mg total) by mouth daily with breakfast for 4 days.   PROAIR HFA 108 (90 Base) MCG/ACT inhaler Generic drug:  albuterol Inhale 2 puffs into the lungs every 4 (four) hours as needed.   senna-docusate 8.6-50 MG tablet Commonly known as:  Senokot-S Take 1 tablet by mouth 2 (two) times daily.   traMADol 50 MG tablet Commonly known as:  ULTRAM Take 1 tablet (50 mg total) by mouth every 6 (six) hours as needed.   triamcinolone cream 0.1 % Commonly known as:  KENALOG Apply 0.1 % topically 2 (two) times daily as needed.   VITAMIN D-1000 MAX ST 1000 units tablet Generic drug:  Cholecalciferol Take 1,000 mg by mouth daily.         Today   CHIEF COMPLAINT:   Patient still with some coarse breath sounds and cough   VITAL SIGNS:  Blood pressure (!) 146/77, pulse (!) 120, temperature 99.8 F (37.7 C), temperature source Oral, resp. rate 16, height 4\' 8"  (1.422 m), weight 47.9 kg (105 lb 9.6 oz), SpO2 91 %.   REVIEW OF SYSTEMS:  Review of Systems  Constitutional: Negative for chills, fever and malaise/fatigue.  HENT: Negative.  Negative for ear discharge, ear pain, hearing loss, nosebleeds and sore throat.   Eyes: Negative.  Negative for blurred vision and pain.  Respiratory: Positive for cough. Negative for hemoptysis, shortness of breath and wheezing.   Cardiovascular: Negative.  Negative for chest pain, palpitations and leg swelling.  Gastrointestinal: Negative.  Negative for abdominal pain, blood in stool, diarrhea, nausea  and vomiting.  Genitourinary: Negative.  Negative for dysuria.  Musculoskeletal: Positive for back pain and falls.  Skin: Negative.   Neurological: Positive for weakness. Negative for dizziness, tremors, speech change, focal weakness, seizures and headaches.  Endo/Heme/Allergies: Negative.  Does not bruise/bleed easily.  Psychiatric/Behavioral: Negative.  Negative for depression, hallucinations and suicidal ideas.     PHYSICAL EXAMINATION:  GENERAL:  77 y.o.-year-old patient lying in the bed with no acute distress. Thin and frail NECK:  Supple, no jugular venous distention. No thyroid enlargement, no tenderness.  LUNGS: Bilateral rhonchi no wheezing or rales good airflow.  CARDIOVASCULAR: S1, S2 normal. No murmurs, rubs, or gallops.  ABDOMEN: Soft, non-tender, non-distended. Bowel sounds present. No organomegaly or mass.  EXTREMITIES: No pedal edema, cyanosis, or clubbing.  PSYCHIATRIC: The patient is alert and oriented x 3.  SKIN: No obvious rash, lesion, or ulcer.  Back: L1 tenderness Right shoulder decreased range of motion DATA REVIEW:   CBC Recent Labs  Lab 05/13/17 1123  WBC 9.2  HGB 14.0  HCT 41.4  PLT 139*    Chemistries  Recent Labs  Lab 05/13/17 1123 05/14/17 0513 05/15/17 0317  NA 124* 128* 130*  K 3.0* 4.0 2.8*  CL 86* 95* 97*  CO2 25 27 25   GLUCOSE 150* 158* 134*  BUN 12 10 8   CREATININE 0.74 0.49 0.41*  CALCIUM 8.8* 8.1* 7.6*  MG  --  1.7  --   AST 45*  --   --   ALT 18  --   --   ALKPHOS 65  --   --   BILITOT 1.0  --   --     Cardiac Enzymes Recent Labs  Lab 05/13/17 1123  TROPONINI <0.03    Microbiology Results  @[email or web address] @  RADIOLOGY:  Dg Chest 2 View  Result Date: 05/13/2017 CLINICAL DATA:  Cough, recent fall EXAM: CHEST  2 VIEW COMPARISON:  Chest x-ray of 05/12/2017 FINDINGS: The lungs remain somewhat hyperaerated but clear. No pneumonia or effusion is seen. Mediastinal hilar contours are unremarkable and the heart is within  normal limits in size. The bones are osteopenic. There are diffuse degenerative changes in the shoulders. The right distal clavicle fracture with separation of the right AC joint is not as well seen by chest x-ray. IMPRESSION: 1. No active lung disease.  Slight hyper aeration. 2. The fracture of the distal right clavicle with dislocation of the right AC joint noted on right shoulder films is not as well seen by chest x-ray. 3. Degenerative changes of the shoulders. Electronically Signed   By: Ivar Drape M.D.   On: 05/13/2017 10:39   Dg Lumbar Spine Complete  Result Date: 05/13/2017 CLINICAL DATA:  Cough, fell yesterday with some low back pain EXAM: LUMBAR SPINE - COMPLETE 4+ VIEW COMPARISON:  None. FINDINGS: The lumbar vertebrae are slightly straightened in alignment. There is what appears to be and acute compression deformity of L1 vertebral body of approximately 40%. There may be slight retropulsion at the level of the compression deformity. The bones are osteopenic. There is degenerative disc disease at L4-5 with some loss of disc space. The SI joints are corticated. IMPRESSION: 1. Probable acute compression deformity of L1 vertebral body of approximately 40% with some retropulsion. 2. Diffuse osteopenia. 3. 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 Electronically Signed   By: Ivar Drape M.D.   On: 05/13/2017 10:26   Dg Pelvis 1-2 Views  Result Date: 05/13/2017 CLINICAL DATA:  [place] yesterday now with low back pain EXAM: PELVIS - 1-2 VIEW COMPARISON:  None FINDINGS: There is mild degenerative joint disease with some loss of joint space bilaterally. No hip fracture is seen. There appears to be and old left inferior pubic ramus fracture but clinical correlation is recommended. Also there is a faint lucency through the right pubic ramus most likely overlapping, but a subtle fracture cannot be excluded. The pubic ramus is in normal position. The SI joints are corticated. IMPRESSION: 1. Probable old fracture  of the left inferior pelvic ramus. Correlate clinically. 2. Vague lucency through the right pubic ramus may be due to overlapping structures, but correlate clinically or consider CT of the pelvis to assess these areas further. Electronically Signed   By: Ivar Drape M.D.   On: 05/13/2017 12:10   Dg Shoulder Right  Result Date: 05/13/2017 CLINICAL DATA:  Recent fall, back pain EXAM: RIGHT SHOULDER - 2+ VIEW COMPARISON:  Chest x-ray of 05/13/2017 and 05/12/2017 FINDINGS: Fracture separation of the right [facility] joint is noted. There is a significant degenerative joint disease of the right glenohumeral joint with loss  of joint space and spurring. Cortical discontinuity of the posterior right seventh rib is noted which could represent fracture or old deformity. IMPRESSION: 1. Fracture/separation of the right [facility] joint which may be acute. Correlate clinically. 2. 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 Electronically Signed   By: Ivar Drape M.D.   On: 05/13/2017 10:29   Ct Head Wo Contrast  Result Date: 05/13/2017 CLINICAL DATA:  Fall, posterior skull injury, hematoma.  Neck pain. EXAM: CT HEAD WITHOUT CONTRAST CT CERVICAL SPINE WITHOUT CONTRAST TECHNIQUE: Multidetector CT imaging of the head and cervical spine was performed following the standard protocol without intravenous contrast. Multiplanar CT image reconstructions of the cervical spine were also generated. COMPARISON:  None. FINDINGS: CT HEAD FINDINGS Brain: No evidence of acute infarction, hemorrhage, hydrocephalus, extra-axial collection or mass lesion/mass effect. Mild cortical atrophy. Subcortical white matter and periventricular small vessel ischemic changes. Vascular: Intracranial atherosclerosis. Skull: Normal. Negative for fracture or focal lesion. Sinuses/Orbits: Partial opacification of the bilateral ethmoid and sphenoid sinuses. Opacification of the right mastoid air cells. Other: Mild soft tissue swelling/hematoma  overlying the right posterior vertex. CT CERVICAL SPINE FINDINGS Alignment: Normal cervical lordosis. Skull base and vertebrae: No acute fracture. No primary bone lesion or focal pathologic process. Soft tissues and spinal canal: No prevertebral fluid or swelling. No visible canal hematoma. Disc levels:  Mild degenerative changes of the mid cervical spine. Spinal canal is patent. Upper chest: Visualized lung apices are notable for biapical pleural-parenchymal scarring. Other: Visualized thyroid is unremarkable. IMPRESSION: Mild soft tissue swelling/hematoma overlying the right posterior vertex. No evidence of calvarial fracture. No evidence of acute intracranial abnormality. Atrophy with small vessel ischemic changes. Opacification of the right mastoid air cells. No evidence of traumatic injury to the cervical spine. Electronically Signed   By: Julian Hy M.D.   On: 05/13/2017 11:28   Ct Cervical Spine Wo Contrast  Result Date: 05/13/2017 CLINICAL DATA:  Fall, posterior skull injury, hematoma.  Neck pain. EXAM: CT HEAD WITHOUT CONTRAST CT CERVICAL SPINE WITHOUT CONTRAST TECHNIQUE: Multidetector CT imaging of the head and cervical spine was performed following the standard protocol without intravenous contrast. Multiplanar CT image reconstructions of the cervical spine were also generated. COMPARISON:  None. FINDINGS: CT HEAD FINDINGS Brain: No evidence of acute infarction, hemorrhage, hydrocephalus, extra-axial collection or mass lesion/mass effect. Mild cortical atrophy. Subcortical white matter and periventricular small vessel ischemic changes. Vascular: Intracranial atherosclerosis. Skull: Normal. Negative for fracture or focal lesion. Sinuses/Orbits: Partial opacification of the bilateral ethmoid and sphenoid sinuses. Opacification of the right mastoid air cells. Other: Mild soft tissue swelling/hematoma overlying the right posterior vertex. CT CERVICAL SPINE FINDINGS Alignment: Normal cervical  lordosis. Skull base and vertebrae: No acute fracture. No primary bone lesion or focal pathologic process. Soft tissues and spinal canal: No prevertebral fluid or swelling. No visible canal hematoma. Disc levels:  Mild degenerative changes of the mid cervical spine. Spinal canal is patent. Upper chest: Visualized lung apices are notable for biapical pleural-parenchymal scarring. Other: Visualized thyroid is unremarkable. IMPRESSION: Mild soft tissue swelling/hematoma overlying the right posterior vertex. No evidence of calvarial fracture. No evidence of acute intracranial abnormality. Atrophy with small vessel ischemic changes. Opacification of the right mastoid air cells. No evidence of traumatic injury to the cervical spine. Electronically Signed   By: Julian Hy M.D.   On: 05/13/2017 11:28   Ct Lumbar Spine Wo Contrast  Result Date: 05/13/2017 CLINICAL DATA:  Status post fall yesterday.  Back pain. EXAM: CT  LUMBAR SPINE WITHOUT CONTRAST TECHNIQUE: Multidetector CT imaging of the lumbar spine was performed without intravenous contrast administration. Multiplanar CT image reconstructions were also generated. COMPARISON:  None. FINDINGS: Segmentation: 5 lumbar type vertebrae. Alignment: Normal. Vertebrae: Generalized osteopenia. L1 compression fracture with approximately 50% central height loss. 3 mm retropulsion of the posterior inferior margin of L1 vertebral body with minimal impression on the thecal sac. Fracture does not extend into the pedicles or posterior elements. Remainder the vertebral body heights are maintained. No aggressive osseous lesion. No discitis or osteomyelitis. Paraspinal and other soft tissues: No focal paraspinal abnormality. Abdominal aortic atherosclerosis. Other: Osteoarthritis of bilateral sacroiliac joints. 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 Bilateral neural foramina are patent. Bilateral facet arthropathy at L1-2, L2-3, and  L3-4. IMPRESSION: 1. Acute L1 vertebral body compression fracture with approximately 50% central height loss. Electronically Signed   By: Kathreen Devoid   On: 05/13/2017 13:28      Allergies as of 05/15/2017   No Known Allergies     Medication List    STOP taking these medications   azithromycin 250 MG tablet Commonly known as:  ZITHROMAX   HYDROcodone-acetaminophen 5-325 MG tablet Commonly known as:  NORCO/VICODIN     TAKE these medications   acetaminophen 500 MG tablet Commonly known as:  TYLENOL Take 500 mg by mouth 2 (two) times daily.   aspirin EC 81 MG tablet Take 81 mg by mouth daily.   atorvastatin 20 MG tablet Commonly known as:  LIPITOR Take 20 mg by mouth daily.   BELSOMRA 10 MG Tabs Generic drug:  Suvorexant Take 15 mg by mouth at bedtime.   budesonide 0.5 MG/2ML nebulizer solution Commonly known as:  PULMICORT Take 2 mLs by nebulization daily.   calcium-vitamin D 500-200 MG-UNIT tablet Commonly known as:  OSCAL WITH D Take 1 tablet by mouth 2 (two) times daily.   cetirizine 10 MG tablet Commonly known as:  ZYRTEC Take 10 mg by mouth daily.   COMBIVENT RESPIMAT IN Inhale 1 puff into the lungs every 4 (four) hours as needed.   feeding supplement (ENSURE ENLIVE) Liqd Take 237 mLs by mouth 2 (two) times daily between meals.   ferrous sulfate 325 (65 FE) MG tablet Take 325 mg by mouth 2 (two) times daily.   hydroxychloroquine 200 MG tablet Commonly known as:  PLAQUENIL Take 400 mg by mouth daily.   LORazepam 1 MG tablet Commonly known as:  ATIVAN Take 1 mg by mouth at bedtime.   mirtazapine 30 MG tablet Commonly known as:  REMERON Take 30 mg by mouth at bedtime.   mupirocin ointment 2 % Commonly known as:  BACTROBAN Place 1 application into the nose 2 (two) times daily.   oseltamivir 30 MG capsule Commonly known as:  TAMIFLU Take 1 capsule (30 mg total) by mouth 2 (two) times daily for 3 days.   pantoprazole 40 MG tablet Commonly known  as:  PROTONIX Take 1 tablet by mouth daily.   predniSONE 20 MG tablet Commonly known as:  DELTASONE Take 2 tablets (40 mg total) by mouth daily with breakfast for 4 days.   PROAIR HFA 108 (90 Base) MCG/ACT inhaler Generic drug:  albuterol Inhale 2 puffs into the lungs every 4 (four) hours as needed.   senna-docusate 8.6-50 MG tablet Commonly known as:  Senokot-S Take 1 tablet by mouth 2 (two) times daily.   traMADol 50 MG tablet Commonly known as:  ULTRAM Take 1 tablet (  50 mg total) by mouth every 6 (six) hours as needed.   triamcinolone cream 0.1 % Commonly known as:  KENALOG Apply 0.1 % topically 2 (two) times daily as needed.   VITAMIN D-1000 MAX ST 1000 units tablet Generic drug:  Cholecalciferol Take 1,000 mg by mouth daily.         Management plans discussed with the patient and she is in agreement. Stable for discharge   Patient should follow up with PCP  CODE STATUS:     Code Status Orders  (From admission, onward)        Start     Ordered   05/13/17 1603  Full code  Continuous     05/13/17 1603    Code Status History    Date Active Date Inactive Code Status Order ID Comments User Context   This patient has a current code status but no historical code status.      TOTAL TIME TAKING CARE OF THIS PATIENT: 38 minutes.    Note: This dictation was prepared with Dragon dictation along with smaller phrase technology. Any transcriptional errors that result from this process are unintentional.  Tykiera Raven M.D on 05/15/2017 at 9:24 AM  Between 7am to 6pm - Pager - [phone] After 6pm go to [email or web address] - password [email or web address] [facility]  Office  [phone]  CC: Primary care physician; Remi Haggard, FNP

## 2017-05-15 NOTE — Progress Notes (Signed)
[facility] was notified that is ready for discharge. Home sending transport now. No further orders at this time.

## 2017-05-15 NOTE — Progress Notes (Signed)
Enema was given.

## 2017-05-22 ENCOUNTER — Inpatient Hospital Stay
Admission: EM | Admit: 2017-05-22 | Discharge: 2017-06-02 | DRG: 872 | Disposition: A | Discharge status: Hospice | Payer: Medicare Other | Attending: Internal Medicine | Admitting: Internal Medicine

## 2017-05-22 ENCOUNTER — Other Ambulatory Visit: Payer: Self-pay

## 2017-05-22 ENCOUNTER — Encounter: Payer: Self-pay | Admitting: Emergency Medicine

## 2017-05-22 ENCOUNTER — Emergency Department: Payer: Medicare Other

## 2017-05-22 DIAGNOSIS — E785 Hyperlipidemia, unspecified: Secondary | ICD-10-CM | POA: Diagnosis present

## 2017-05-22 DIAGNOSIS — M545 Low back pain: Secondary | ICD-10-CM | POA: Diagnosis not present

## 2017-05-22 DIAGNOSIS — Z66 Do not resuscitate: Secondary | ICD-10-CM | POA: Diagnosis not present

## 2017-05-22 DIAGNOSIS — M8458XD Pathological fracture in neoplastic disease, other specified site, subsequent encounter for fracture with routine healing: Secondary | ICD-10-CM | POA: Diagnosis present

## 2017-05-22 DIAGNOSIS — N390 Urinary tract infection, site not specified: Secondary | ICD-10-CM | POA: Diagnosis present

## 2017-05-22 DIAGNOSIS — L89310 Pressure ulcer of right buttock, unstageable: Secondary | ICD-10-CM | POA: Diagnosis present

## 2017-05-22 DIAGNOSIS — M069 Rheumatoid arthritis, unspecified: Secondary | ICD-10-CM | POA: Diagnosis present

## 2017-05-22 DIAGNOSIS — Z6823 Body mass index (BMI) 23.0-23.9, adult: Secondary | ICD-10-CM | POA: Diagnosis not present

## 2017-05-22 DIAGNOSIS — Z515 Encounter for palliative care: Secondary | ICD-10-CM | POA: Diagnosis not present

## 2017-05-22 DIAGNOSIS — C3491 Malignant neoplasm of unspecified part of right bronchus or lung: Secondary | ICD-10-CM | POA: Diagnosis present

## 2017-05-22 DIAGNOSIS — R7881 Bacteremia: Secondary | ICD-10-CM | POA: Diagnosis not present

## 2017-05-22 DIAGNOSIS — Z9181 History of falling: Secondary | ICD-10-CM | POA: Diagnosis not present

## 2017-05-22 DIAGNOSIS — I509 Heart failure, unspecified: Secondary | ICD-10-CM | POA: Diagnosis not present

## 2017-05-22 DIAGNOSIS — I5032 Chronic diastolic (congestive) heart failure: Secondary | ICD-10-CM | POA: Diagnosis present

## 2017-05-22 DIAGNOSIS — R627 Adult failure to thrive: Secondary | ICD-10-CM | POA: Diagnosis present

## 2017-05-22 DIAGNOSIS — Z79899 Other long term (current) drug therapy: Secondary | ICD-10-CM | POA: Diagnosis not present

## 2017-05-22 DIAGNOSIS — R5383 Other fatigue: Secondary | ICD-10-CM | POA: Diagnosis present

## 2017-05-22 DIAGNOSIS — E876 Hypokalemia: Secondary | ICD-10-CM | POA: Diagnosis present

## 2017-05-22 DIAGNOSIS — R591 Generalized enlarged lymph nodes: Secondary | ICD-10-CM | POA: Diagnosis not present

## 2017-05-22 DIAGNOSIS — A419 Sepsis, unspecified organism: Secondary | ICD-10-CM | POA: Diagnosis not present

## 2017-05-22 DIAGNOSIS — C7951 Secondary malignant neoplasm of bone: Secondary | ICD-10-CM | POA: Diagnosis present

## 2017-05-22 DIAGNOSIS — K5903 Drug induced constipation: Secondary | ICD-10-CM | POA: Diagnosis not present

## 2017-05-22 DIAGNOSIS — Z7982 Long term (current) use of aspirin: Secondary | ICD-10-CM

## 2017-05-22 DIAGNOSIS — R918 Other nonspecific abnormal finding of lung field: Secondary | ICD-10-CM

## 2017-05-22 DIAGNOSIS — I340 Nonrheumatic mitral (valve) insufficiency: Secondary | ICD-10-CM | POA: Diagnosis not present

## 2017-05-22 DIAGNOSIS — I110 Hypertensive heart disease with heart failure: Secondary | ICD-10-CM | POA: Diagnosis present

## 2017-05-22 DIAGNOSIS — A4102 Sepsis due to Methicillin resistant Staphylococcus aureus: Principal | ICD-10-CM | POA: Diagnosis present

## 2017-05-22 DIAGNOSIS — Z8614 Personal history of Methicillin resistant Staphylococcus aureus infection: Secondary | ICD-10-CM | POA: Diagnosis not present

## 2017-05-22 DIAGNOSIS — J45909 Unspecified asthma, uncomplicated: Secondary | ICD-10-CM | POA: Diagnosis present

## 2017-05-22 DIAGNOSIS — W19XXXA Unspecified fall, initial encounter: Secondary | ICD-10-CM | POA: Diagnosis not present

## 2017-05-22 DIAGNOSIS — K5900 Constipation, unspecified: Secondary | ICD-10-CM | POA: Diagnosis not present

## 2017-05-22 DIAGNOSIS — S32019A Unspecified fracture of first lumbar vertebra, initial encounter for closed fracture: Secondary | ICD-10-CM | POA: Diagnosis not present

## 2017-05-22 DIAGNOSIS — G2581 Restless legs syndrome: Secondary | ICD-10-CM | POA: Diagnosis present

## 2017-05-22 DIAGNOSIS — Z7189 Other specified counseling: Secondary | ICD-10-CM

## 2017-05-22 DIAGNOSIS — B961 Klebsiella pneumoniae [K. pneumoniae] as the cause of diseases classified elsewhere: Secondary | ICD-10-CM | POA: Diagnosis present

## 2017-05-22 DIAGNOSIS — A4902 Methicillin resistant Staphylococcus aureus infection, unspecified site: Secondary | ICD-10-CM | POA: Diagnosis not present

## 2017-05-22 DIAGNOSIS — Z7951 Long term (current) use of inhaled steroids: Secondary | ICD-10-CM

## 2017-05-22 DIAGNOSIS — L8990 Pressure ulcer of unspecified site, unspecified stage: Secondary | ICD-10-CM

## 2017-05-22 DIAGNOSIS — M549 Dorsalgia, unspecified: Secondary | ICD-10-CM

## 2017-05-22 HISTORY — DX: Bacteremia: R78.81

## 2017-05-22 LAB — BASIC METABOLIC PANEL
ANION GAP: 12 (ref 5–15)
BUN: 23 mg/dL — AB (ref 6–20)
CO2: 29 mmol/L (ref 22–32)
Calcium: 8.3 mg/dL — ABNORMAL LOW (ref 8.9–10.3)
Chloride: 94 mmol/L — ABNORMAL LOW (ref 101–111)
Creatinine, Ser: 0.47 mg/dL (ref 0.44–1.00)
GFR calc Af Amer: >60 mL/min (ref >60)
GFR calc non Af Amer: >60 mL/min (ref >60)
Glucose, Bld: 105 mg/dL — ABNORMAL HIGH (ref 65–99)
POTASSIUM: 2.6 mmol/L — AB (ref 3.5–5.1)
Sodium: 135 mmol/L (ref 135–145)

## 2017-05-22 LAB — URINALYSIS, ROUTINE W REFLEX MICROSCOPIC
Glucose, UA: NEGATIVE mg/dL
HGB URINE DIPSTICK: NEGATIVE
Ketones, ur: NEGATIVE mg/dL
NITRITE: NEGATIVE
PH: 5 (ref 5.0–8.0)
Protein, ur: 100 mg/dL — AB
SPECIFIC GRAVITY, URINE: 1.028 (ref 1.005–1.030)

## 2017-05-22 LAB — CBC
HEMATOCRIT: 35.4 % (ref 35.0–47.0)
Hemoglobin: 12 g/dL (ref 12.0–16.0)
MCH: 30.4 pg (ref 26.0–34.0)
MCHC: 34 g/dL (ref 32.0–36.0)
MCV: 89.2 fL (ref 80.0–100.0)
Platelets: 337 10*3/uL (ref 150–440)
RBC: 3.97 MIL/uL (ref 3.80–5.20)
RDW: 13.3 % (ref 11.5–14.5)
WBC: 12 10*3/uL — AB (ref 3.6–11.0)

## 2017-05-22 LAB — LACTIC ACID, PLASMA: LACTIC ACID, VENOUS: 1.3 mmol/L (ref 0.5–1.9)

## 2017-05-22 LAB — MAGNESIUM: Magnesium: 1.8 mg/dL (ref 1.7–2.4)

## 2017-05-22 LAB — TROPONIN I: Troponin I: 0.03 ng/mL (ref <0.03)

## 2017-05-22 MED ORDER — LORAZEPAM 1 MG PO TABS
1.0000 mg | ORAL_TABLET | Freq: Every evening | ORAL | Status: DC
  Administered 2017-05-22 – 2017-05-31 (×9): 1 mg via ORAL
  Filled 2017-05-22 (×9): qty 1

## 2017-05-22 MED ORDER — BUDESONIDE 0.5 MG/2ML IN SUSP
2.0000 mL | Freq: Every day | RESPIRATORY_TRACT | Status: DC
  Administered 2017-05-23 – 2017-05-31 (×9): 0.5 mg via RESPIRATORY_TRACT
  Filled 2017-05-22 (×11): qty 2

## 2017-05-22 MED ORDER — ACETAMINOPHEN 325 MG PO TABS
650.0000 mg | ORAL_TABLET | Freq: Four times a day (QID) | ORAL | Status: DC | PRN
  Administered 2017-05-26: 650 mg via ORAL
  Filled 2017-05-22 (×2): qty 2

## 2017-05-22 MED ORDER — CEFTRIAXONE SODIUM 1 G IJ SOLR
1.0000 g | Freq: Once | INTRAMUSCULAR | Status: AC
  Administered 2017-05-22: 1 g via INTRAVENOUS
  Filled 2017-05-22: qty 10

## 2017-05-22 MED ORDER — TRAMADOL HCL 50 MG PO TABS
50.0000 mg | ORAL_TABLET | Freq: Four times a day (QID) | ORAL | Status: DC | PRN
  Administered 2017-05-22 – 2017-06-02 (×15): 50 mg via ORAL
  Filled 2017-05-22 (×16): qty 1

## 2017-05-22 MED ORDER — ONDANSETRON HCL 4 MG PO TABS: 4.0000 mg | ORAL_TABLET | Freq: Four times a day (QID) | ORAL | Status: DC | PRN

## 2017-05-22 MED ORDER — POTASSIUM CHLORIDE 20 MEQ/15ML (10%) PO SOLN
40.0000 meq | Freq: Once | ORAL | Status: AC
  Administered 2017-05-22: 40 meq via ORAL
  Filled 2017-05-22: qty 30

## 2017-05-22 MED ORDER — METOPROLOL SUCCINATE ER 25 MG PO TB24: 25.0000 mg | ORAL_TABLET | Freq: Every day | ORAL | Status: DC

## 2017-05-22 MED ORDER — HYDROXYCHLOROQUINE SULFATE 200 MG PO TABS
400.0000 mg | ORAL_TABLET | Freq: Every day | ORAL | Status: DC
  Administered 2017-05-23 – 2017-06-01 (×9): 400 mg via ORAL
  Filled 2017-05-22 (×9): qty 2

## 2017-05-22 MED ORDER — GUAIFENESIN 100 MG/5ML PO SOLN
5.0000 mL | ORAL | Status: DC | PRN
  Filled 2017-05-22: qty 5

## 2017-05-22 MED ORDER — PANTOPRAZOLE SODIUM 40 MG PO TBEC
40.0000 mg | DELAYED_RELEASE_TABLET | Freq: Every day | ORAL | Status: DC
Start: 2017-05-23 — End: 2017-06-02
  Administered 2017-05-23 – 2017-06-02 (×10): 40 mg via ORAL
  Filled 2017-05-22 (×10): qty 1

## 2017-05-22 MED ORDER — ALBUTEROL SULFATE (2.5 MG/3ML) 0.083% IN NEBU: 3.0000 mL | INHALATION_SOLUTION | RESPIRATORY_TRACT | Status: DC | PRN

## 2017-05-22 MED ORDER — FERROUS SULFATE 325 (65 FE) MG PO TABS
325.0000 mg | ORAL_TABLET | Freq: Two times a day (BID) | ORAL | Status: DC
  Administered 2017-05-22 – 2017-05-28 (×12): 325 mg via ORAL
  Filled 2017-05-22 (×12): qty 1

## 2017-05-22 MED ORDER — LORATADINE 10 MG PO TABS
10.0000 mg | ORAL_TABLET | Freq: Every day | ORAL | Status: DC
  Administered 2017-05-23 – 2017-05-28 (×6): 10 mg via ORAL
  Filled 2017-05-22 (×6): qty 1

## 2017-05-22 MED ORDER — SODIUM CHLORIDE 0.9 % IV SOLN
INTRAVENOUS | Status: DC
  Administered 2017-05-22 – 2017-05-23 (×2): via INTRAVENOUS

## 2017-05-22 MED ORDER — SODIUM CHLORIDE 0.9 % IV SOLN
1.0000 g | INTRAVENOUS | Status: DC
  Administered 2017-05-23 – 2017-05-25 (×3): 1 g via INTRAVENOUS
  Filled 2017-05-22 (×4): qty 10

## 2017-05-22 MED ORDER — ATORVASTATIN CALCIUM 20 MG PO TABS
20.0000 mg | ORAL_TABLET | Freq: Every day | ORAL | Status: DC
  Administered 2017-05-22 – 2017-05-28 (×7): 20 mg via ORAL
  Filled 2017-05-22 (×7): qty 1

## 2017-05-22 MED ORDER — ENOXAPARIN SODIUM 40 MG/0.4ML ~~LOC~~ SOLN
40.0000 mg | SUBCUTANEOUS | Status: DC
  Administered 2017-05-22 – 2017-05-31 (×10): 40 mg via SUBCUTANEOUS
  Filled 2017-05-22 (×10): qty 0.4

## 2017-05-22 MED ORDER — MELATONIN 5 MG PO TABS
5.0000 mg | ORAL_TABLET | Freq: Every day | ORAL | Status: DC
  Administered 2017-05-23 – 2017-06-01 (×8): 5 mg via ORAL
  Filled 2017-05-22 (×11): qty 1

## 2017-05-22 MED ORDER — SENNOSIDES-DOCUSATE SODIUM 8.6-50 MG PO TABS
1.0000 | ORAL_TABLET | Freq: Two times a day (BID) | ORAL | Status: DC
  Administered 2017-05-22 – 2017-06-01 (×19): 1 via ORAL
  Filled 2017-05-22 (×19): qty 1

## 2017-05-22 MED ORDER — ACETAMINOPHEN 650 MG RE SUPP: 650.0000 mg | Freq: Four times a day (QID) | RECTAL | Status: DC | PRN

## 2017-05-22 MED ORDER — MIRTAZAPINE 15 MG PO TABS
30.0000 mg | ORAL_TABLET | Freq: Every day | ORAL | Status: DC
  Administered 2017-05-22 – 2017-06-01 (×11): 30 mg via ORAL
  Filled 2017-05-22 (×11): qty 2

## 2017-05-22 MED ORDER — POTASSIUM CHLORIDE 10 MEQ/100ML IV SOLN
10.0000 meq | Freq: Once | INTRAVENOUS | Status: AC
  Administered 2017-05-22: 10 meq via INTRAVENOUS
  Filled 2017-05-22: qty 100

## 2017-05-22 MED ORDER — ENSURE ENLIVE PO LIQD
237.0000 mL | Freq: Two times a day (BID) | ORAL | Status: DC
  Administered 2017-05-22 – 2017-06-02 (×18): 237 mL via ORAL

## 2017-05-22 MED ORDER — POTASSIUM CHLORIDE CRYS ER 20 MEQ PO TBCR
20.0000 meq | EXTENDED_RELEASE_TABLET | Freq: Two times a day (BID) | ORAL | Status: DC
  Administered 2017-05-22: 20 meq via ORAL
  Filled 2017-05-22: qty 1

## 2017-05-22 MED ORDER — MOMETASONE FURO-FORMOTEROL FUM 200-5 MCG/ACT IN AERO
2.0000 | INHALATION_SPRAY | Freq: Two times a day (BID) | RESPIRATORY_TRACT | Status: DC
  Administered 2017-05-23 – 2017-06-02 (×20): 2 via RESPIRATORY_TRACT
  Filled 2017-05-22: qty 8.8

## 2017-05-22 MED ORDER — SODIUM CHLORIDE 0.9 % IV BOLUS (SEPSIS)
500.0000 mL | Freq: Once | INTRAVENOUS | Status: AC
  Administered 2017-05-22: 500 mL via INTRAVENOUS

## 2017-05-22 MED ORDER — ROPINIROLE HCL 1 MG PO TABS
0.5000 mg | ORAL_TABLET | Freq: Every day | ORAL | Status: DC
  Administered 2017-05-22 – 2017-06-01 (×11): 0.5 mg via ORAL
  Filled 2017-05-22 (×11): qty 1

## 2017-05-22 MED ORDER — HYDROCODONE-ACETAMINOPHEN 5-325 MG PO TABS
1.0000 | ORAL_TABLET | Freq: Once | ORAL | Status: AC
  Administered 2017-05-22: 1 via ORAL
  Filled 2017-05-22: qty 1

## 2017-05-22 MED ORDER — GUAIFENESIN-DM 100-10 MG/5ML PO SYRP
5.0000 mL | ORAL_SOLUTION | ORAL | Status: DC | PRN
  Administered 2017-05-22: 5 mL via ORAL
  Filled 2017-05-22: qty 5

## 2017-05-22 MED ORDER — VITAMIN D 1000 UNITS PO TABS
1000.0000 [IU] | ORAL_TABLET | Freq: Every day | ORAL | Status: DC
  Administered 2017-05-23 – 2017-05-28 (×6): 1000 [IU] via ORAL
  Filled 2017-05-22 (×6): qty 1

## 2017-05-22 MED ORDER — SUVOREXANT 15 MG PO TABS: 15.0000 mg | ORAL_TABLET | Freq: Every day | ORAL | Status: DC

## 2017-05-22 MED ORDER — ASPIRIN EC 81 MG PO TBEC
81.0000 mg | DELAYED_RELEASE_TABLET | Freq: Every day | ORAL | Status: DC
  Administered 2017-05-23 – 2017-05-28 (×6): 81 mg via ORAL
  Filled 2017-05-22 (×6): qty 1

## 2017-05-22 MED ORDER — MAGNESIUM SULFATE 2 GM/50ML IV SOLN
2.0000 g | Freq: Once | INTRAVENOUS | Status: AC
  Administered 2017-05-22: 2 g via INTRAVENOUS
  Filled 2017-05-22: qty 50

## 2017-05-22 MED ORDER — BUDESONIDE 0.5 MG/2ML IN SUSP: 2.0000 mL | Freq: Every day | RESPIRATORY_TRACT | Status: DC

## 2017-05-22 MED ORDER — ONDANSETRON HCL 4 MG/2ML IJ SOLN
4.0000 mg | Freq: Four times a day (QID) | INTRAMUSCULAR | Status: DC | PRN
  Administered 2017-05-25 – 2017-05-31 (×3): 4 mg via INTRAVENOUS
  Filled 2017-05-22 (×3): qty 2

## 2017-05-22 NOTE — ED Provider Notes (Signed)
[facility] Emergency Department Provider Note   ____________________________________________   First MD Initiated Contact with Patient 05/22/17 1249     (approximate)  I have reviewed the triage vital signs and the nursing notes.   HISTORY  Chief Complaint Fatigue and poor appetite   HPI Linda Walsh is a 77 y.o. female presents for evaluation of fatigue and poor appetite since leaving the hospital.  Patient was recently admitted for influenza, also treated for bronchitis.  She was discharged back to her care home.  She reports that since leaving the hospital she is continued to feel weak and tired.  She is not eating as well as she used to, but will still have an Ensure shake and did eat breakfast this morning.  Patient reports she has had an ongoing cough, but increased fatigue, weakness.  Feeling generally tired and dehydrated.  Staff at her care home reports she is barely eating the last couple of days and they are concerned that she has had increasing weakness and fatigue.  Denies being in pain except for her low back where she broke 1 of her vertebrae.  No known ongoing fever.  No abdominal pain or vomiting.  Reports decreased appetite.  Reports completing her course of prednisone and Tamiflu  Past Medical History:  Diagnosis Date  . Arthritis   . CHF (congestive heart failure) ([place])   . Depressed   . Renal disorder     Patient Active Problem List   Diagnosis Date Noted  . L1 vertebral fracture ([place]) 05/13/2017    History reviewed. No pertinent surgical history.  Prior to Admission medications   Medication Sig Start Date End Date Taking? Authorizing Provider  acetaminophen (TYLENOL) 500 MG tablet Take 500 mg by mouth 2 (two) times daily. 10/24/09  Yes [provider]  aspirin EC 81 MG tablet Take 81 mg by mouth daily.   Yes [provider]  atorvastatin (LIPITOR) 20 MG tablet Take 20 mg by mouth daily. 10/27/13  Yes  [provider]  budesonide (PULMICORT) 0.5 MG/2ML nebulizer solution Take 2 mLs by nebulization daily. 09/22/16  Yes [provider]  cetirizine (ZYRTEC) 10 MG tablet Take 10 mg by mouth daily.   Yes [provider]  Cholecalciferol (VITAMIN D-1000 MAX ST) 1000 units tablet Take 1,000 mg by mouth daily. 06/21/15  Yes [provider]  feeding supplement, ENSURE ENLIVE, (ENSURE ENLIVE) LIQD Take 237 mLs by mouth 2 (two) times daily between meals. 05/15/17  Yes Mody, Ulice Bold, MD  ferrous sulfate 325 (65 FE) MG tablet Take 325 mg by mouth 2 (two) times daily.   Yes [provider]  Fluticasone-Salmeterol (ADVAIR) 250-50 MCG/DOSE AEPB Inhale 1 puff into the lungs 2 (two) times daily.   Yes [provider]  hydroxychloroquine (PLAQUENIL) 200 MG tablet Take 400 mg by mouth daily.  01/18/16  Yes [provider]  LORazepam (ATIVAN) 1 MG tablet Take 1 mg by mouth every evening.    Yes [provider]  metoprolol succinate (TOPROL-XL) 25 MG 24 hr tablet Take 25 mg by mouth daily.   Yes [provider]  mirtazapine (REMERON) 30 MG tablet Take 30 mg by mouth at bedtime.   Yes [provider]  mupirocin ointment (BACTROBAN) 2 % Place 1 application into the nose 2 (two) times daily. 05/15/17  Yes Mody, Ulice Bold, MD  pantoprazole (PROTONIX) 40 MG tablet Take 1 tablet by mouth daily. 05/12/17  Yes [provider]  [place] 108 (  90 Base) MCG/ACT inhaler Inhale 2 puffs into the lungs every 4 (four) hours as needed. 05/12/17  Yes [provider]  rOPINIRole (REQUIP) 0.5 MG tablet Take 0.5 mg by mouth at bedtime.   Yes [provider]  senna-docusate (SENOKOT-S) 8.6-50 MG tablet Take 1 tablet by mouth 2 (two) times daily.   Yes [provider]  Suvorexant (BELSOMRA) 15 MG TABS Take 15 mg by mouth at bedtime.    Yes [provider]  traMADol (ULTRAM) 50 MG tablet Take 1 tablet (50 mg total) by mouth  every 6 (six) hours as needed. 05/15/17 05/15/18 Yes Mody, Ulice Bold, MD  calcium-vitamin D (OSCAL WITH D) 500-200 MG-UNIT tablet Take 1 tablet by mouth 2 (two) times daily. Patient not taking: Reported on 05/22/2017 05/15/17   Bettey Costa, MD    Allergies Patient has no known allergies.  History reviewed. No pertinent family history.  Social History Social History   Tobacco Use  . Smoking status: Never Smoker  . Smokeless tobacco: Never Used  Substance Use Topics  . Alcohol use: No  . Drug use: Not on file    Review of Systems Constitutional: No fever/chills Eyes: No visual changes. ENT: No sore throat. Cardiovascular: Denies chest pain. Respiratory: Denies shortness of breath.  Frequent cough, productive at times. Gastrointestinal: No abdominal pain.  No nausea, no vomiting.  No diarrhea.  No constipation. Genitourinary: Negative for dysuria. Musculoskeletal: See HPI Skin: Negative for rash. Neurological: Negative for headaches, focal weakness or numbness.    ____________________________________________   PHYSICAL EXAM:  VITAL SIGNS: ED Triage Vitals  Enc Vitals Group     BP 05/22/17 1241 120/77     Pulse Rate 05/22/17 1241 (!) 102     Resp 05/22/17 1241 20     Temp 05/22/17 1241 98.3 F (36.8 C)     Temp Source 05/22/17 1241 Oral     SpO2 05/22/17 1241 93 %     Weight 05/22/17 1244 105 lb (47.6 kg)     Height 05/22/17 1244 4\' 8"  (1.422 m)     Head Circumference --      Peak Flow --      Pain Score 05/22/17 1244 0     Pain Loc --      Pain Edu? --      Excl. in [place]? --     Constitutional: Alert and oriented.  Chronically ill, in no distress. Eyes: Conjunctivae are normal. Head: Atraumatic. Nose: No congestion/rhinnorhea. Mouth/Throat: Mucous membranes are moist. Neck: No stridor.  No meningismus Cardiovascular: Normal rate, regular rhythm. Grossly normal heart sounds.  Good peripheral circulation. Respiratory: Normal respiratory effort.  No retractions. Lungs  CTAB.  Rhonchorous cough, lung sounds are clear bilateral to auscultation however.  In no respiratory distress Gastrointestinal: Soft and nontender. No distention. Musculoskeletal: No lower extremity tenderness nor edema. Neurologic:  Normal speech and language. No gross focal neurologic deficits are appreciated.  Skin:  Skin is warm, dry and intact. No rash noted. Psychiatric: Mood and affect are normal. Speech and behavior are normal.  ____________________________________________   LABS (all labs ordered are listed, but only abnormal results are displayed)  Labs Reviewed  URINALYSIS, ROUTINE W REFLEX MICROSCOPIC - Abnormal; Notable for the following components:      Result Value   Color, Urine AMBER (*)    APPearance CLOUDY (*)    Bilirubin Urine SMALL (*)    Protein, ur 100 (*)    Leukocytes, UA MODERATE (*)    Bacteria,  UA MANY (*)    Squamous Epithelial / LPF 6-30 (*)    All other components within normal limits  CBC - Abnormal; Notable for the following components:   WBC 12.0 (*)    All other components within normal limits  BASIC METABOLIC PANEL - Abnormal; Notable for the following components:   Potassium 2.6 (*)    Chloride 94 (*)    Glucose, Bld 105 (*)    BUN 23 (*)    Calcium 8.3 (*)    All other components within normal limits  TROPONIN I - Abnormal; Notable for the following components:   Troponin I 0.03 (*)    All other components within normal limits  URINE CULTURE  CULTURE, BLOOD (ROUTINE X 2)  CULTURE, BLOOD (ROUTINE X 2)  MAGNESIUM  LACTIC ACID, PLASMA  LACTIC ACID, PLASMA   ____________________________________________  EKG  Reviewed enterotomy at 1330 Heart rate 105 qrs 120 QTC 570   Normal sinus rhythm, right bundle branch block, prolonged QT ____________________________________________  RADIOLOGY    Chest x-ray reviewed by me, no acute ____________________________________________   PROCEDURES  Procedure(s) performed:  None  Procedures  Critical Care performed: No  ____________________________________________   INITIAL IMPRESSION / ASSESSMENT AND PLAN / ED COURSE  Pertinent labs & imaging results that were available during my care of the patient were reviewed by me and considered in my medical decision making (see chart for details).  Patient presents for evaluation of increasing fatigue.  Recent influenza and bronchitis.  Still having ongoing rhonchorous cough, but no evidence of increased work of breathing or distress.  She does however appear fatigued, reports generalized weakness.  Denies any acute cardiac or pulmonary symptoms except for cough.  Mental status appears to be her baseline, alert oriented.  Generally fatigued without focal deficits.  Abdomen soft nontender  ----------------------------------------- 2:50 PM on 05/22/2017 -----------------------------------------  Urinalysis appears consistent with urinary tract infection, also fatigue prolonged QT and hypokalemia.  Have added on a magnesium, will replete potassium here.  Given the patient is borderline for sepsis, I will admit her draw blood cultures and a lactic acid which the hospitalist Dr. Verdell Carmine will follow up on.  Patient agreeable with admission      ____________________________________________   FINAL CLINICAL IMPRESSION(S) / ED DIAGNOSES  Final diagnoses:  Urinary tract infection, acute  Hypokalemia  Sepsis, due to unspecified organism [facility])      NEW MEDICATIONS STARTED DURING THIS VISIT:  New Prescriptions   No medications on file     Note:  This document was prepared using Dragon voice recognition software and may include unintentional dictation errors.     Delman Kitten, MD 05/22/17 1451

## 2017-05-22 NOTE — ED Triage Notes (Signed)
Pt brought to ED via [facility] from [facility]. EMS reports that pt has no complaints, but group home reported that she had the flu and pneumonia last week and that she is not her usual self. They also stated that pt has already finished her antibiotics from her pneumonia.

## 2017-05-22 NOTE — ED Notes (Signed)
Report called to floor. VSS. NAD.

## 2017-05-22 NOTE — H&P (Signed)
Linda Walsh    MR#:  [id number]  DATE OF BIRTH:  1940/12/12  DATE OF ADMISSION:  05/22/2017  PRIMARY CARE PHYSICIAN: Remi Haggard, FNP   REQUESTING/REFERRING PHYSICIAN: Dr. Delman Kitten  CHIEF COMPLAINT:   Chief Complaint  Patient presents with  . Failure To Thrive  Weakness.   HISTORY OF PRESENT ILLNESS:  Linda Walsh  is a 77 y.o. female with a known history of Takasubo cardiomyopathy, rheumatoid arthritis, depression who presents to the hospital from a group home due to weakness, lethargy, poor by mouth intake. Patient was recently in the hospital related to asthma exacerbation due to flu and also noted to have a L1 fracture, displaced fracture of the right clavicle which was managed nonoperatively. She now returns back to the hospital due to the above complaints and noted to have a urinary tract infection also noted to be hypokalemic. Hospitalist services were contacted further treatment and evaluation. Patient is still complaining of significant lower back pain, but denies any nausea vomiting, fevers chills, cough or congestion.  PAST MEDICAL HISTORY:   Past Medical History:  Diagnosis Date  . Arthritis   . CHF (congestive heart failure) ([place])   . Depressed   . Renal disorder     PAST SURGICAL HISTORY:  History reviewed. No pertinent surgical history.  SOCIAL HISTORY:   Social History   Tobacco Use  . Smoking status: Never Smoker  . Smokeless tobacco: Never Used  Substance Use Topics  . Alcohol use: No    FAMILY HISTORY:   Family History  Problem Relation Age of Onset  . Heart disease Father     DRUG ALLERGIES:  No Known Allergies  REVIEW OF SYSTEMS:   Review of Systems  Constitutional: Negative for chills, fever and weight loss.  HENT: Negative for congestion, nosebleeds and tinnitus.   Eyes: Negative for blurred vision, double vision and redness.  Respiratory: Negative for  cough, hemoptysis, shortness of breath and wheezing.   Cardiovascular: Negative for chest pain, orthopnea, leg swelling and PND.  Gastrointestinal: Negative for abdominal pain, diarrhea, melena, nausea and vomiting.  Genitourinary: Negative for dysuria, hematuria and urgency.  Musculoskeletal: Positive for falls. Negative for joint pain.  Neurological: Positive for weakness. Negative for dizziness, tingling, sensory change, focal weakness, seizures and headaches.  Endo/Heme/Allergies: Negative for polydipsia. Does not bruise/bleed easily.  Psychiatric/Behavioral: Negative for depression and memory loss. The patient is not nervous/anxious.   All other systems reviewed and are negative.   MEDICATIONS AT HOME:   Prior to Admission medications   Medication Sig Start Date End Date Taking? Authorizing Provider  acetaminophen (TYLENOL) 500 MG tablet Take 500 mg by mouth 2 (two) times daily. 10/24/09  Yes [provider]  aspirin EC 81 MG tablet Take 81 mg by mouth daily.   Yes [provider]  atorvastatin (LIPITOR) 20 MG tablet Take 20 mg by mouth daily. 10/27/13  Yes [provider]  budesonide (PULMICORT) 0.5 MG/2ML nebulizer solution Take 2 mLs by nebulization daily. 09/22/16  Yes [provider]  cetirizine (ZYRTEC) 10 MG tablet Take 10 mg by mouth daily.   Yes [provider]  Cholecalciferol (VITAMIN D-1000 MAX ST) 1000 units tablet Take 1,000 mg by mouth daily. 06/21/15  Yes [provider]  feeding supplement, ENSURE ENLIVE, (ENSURE ENLIVE) LIQD Take 237 mLs by mouth 2 (two) times daily between meals. 05/15/17  Yes Bettey Costa, MD  ferrous sulfate 325 (  65 FE) MG tablet Take 325 mg by mouth 2 (two) times daily.   Yes [provider]  Fluticasone-Salmeterol (ADVAIR) 250-50 MCG/DOSE AEPB Inhale 1 puff into the lungs 2 (two) times daily.   Yes [provider]  hydroxychloroquine (PLAQUENIL) 200 MG tablet Take 400 mg by mouth  daily.  01/18/16  Yes [provider]  LORazepam (ATIVAN) 1 MG tablet Take 1 mg by mouth every evening.    Yes [provider]  metoprolol succinate (TOPROL-XL) 25 MG 24 hr tablet Take 25 mg by mouth daily.   Yes [provider]  mirtazapine (REMERON) 30 MG tablet Take 30 mg by mouth at bedtime.   Yes [provider]  mupirocin ointment (BACTROBAN) 2 % Place 1 application into the nose 2 (two) times daily. 05/15/17  Yes Mody, Ulice Bold, MD  pantoprazole (PROTONIX) 40 MG tablet Take 1 tablet by mouth daily. 05/12/17  Yes [provider]  PROAIR HFA 108 (90 Base) MCG/ACT inhaler Inhale 2 puffs into the lungs every 4 (four) hours as needed. 05/12/17  Yes [provider]  rOPINIRole (REQUIP) 0.5 MG tablet Take 0.5 mg by mouth at bedtime.   Yes [provider]  senna-docusate (SENOKOT-S) 8.6-50 MG tablet Take 1 tablet by mouth 2 (two) times daily.   Yes [provider]  Suvorexant (BELSOMRA) 15 MG TABS Take 15 mg by mouth at bedtime.    Yes [provider]  traMADol (ULTRAM) 50 MG tablet Take 1 tablet (50 mg total) by mouth every 6 (six) hours as needed. 05/15/17 05/15/18 Yes Mody, Ulice Bold, MD  calcium-vitamin D (OSCAL WITH D) 500-200 MG-UNIT tablet Take 1 tablet by mouth 2 (two) times daily. Patient not taking: Reported on 05/22/2017 05/15/17   Bettey Costa, MD      VITAL SIGNS:  Blood pressure 120/77, pulse (!) 102, temperature 98.3 F (36.8 C), temperature source Oral, resp. rate 20, height 4\' 8"  (1.422 m), weight 47.6 kg (105 lb), SpO2 93 %.  PHYSICAL EXAMINATION:  Physical Exam  GENERAL:  77 y.o.-year-old patient lying in the bed lethargic but in no acute distress. EYES: Pupils equal, round, reactive to light and accommodation. No scleral icterus. Extraocular muscles intact.  HEENT: Head atraumatic, normocephalic. Oropharynx and nasopharynx clear. No oropharyngeal erythema, moist oral mucosa  NECK:  Supple, no jugular venous  distention. No thyroid enlargement, no tenderness.  LUNGS: Poor respiratory effort, no wheezing, rales, rhonchi. No use of accessory muscles of respiration.  CARDIOVASCULAR: S1, S2 RRR. No murmurs, rubs, gallops, clicks.  ABDOMEN: Soft, nontender, nondistended. Bowel sounds present. No organomegaly or mass.  EXTREMITIES: No pedal edema, cyanosis, or clubbing. + 2 pedal & radial pulses b/l.   NEUROLOGIC: Cranial nerves II through XII are intact. No focal Motor or sensory deficits appreciated b/l. Globally weak PSYCHIATRIC: The patient is alert and oriented x 3.  SKIN: No obvious rash, lesion, or ulcer.   LABORATORY PANEL:   CBC Recent Labs  Lab 05/22/17 1249  WBC 12.0*  HGB 12.0  HCT 35.4  PLT 337   ------------------------------------------------------------------------------------------------------------------  Chemistries  Recent Labs  Lab 05/22/17 1249  NA 135  K 2.6*  CL 94*  CO2 29  GLUCOSE 105*  BUN 23*  CREATININE 0.47  CALCIUM 8.3*   ------------------------------------------------------------------------------------------------------------------  Cardiac Enzymes Recent Labs  Lab 05/22/17 1249  TROPONINI 0.03*   ------------------------------------------------------------------------------------------------------------------  RADIOLOGY:  Dg Chest 2 View  Result Date: 05/22/2017 CLINICAL DATA:  Fatigue, recent flu EXAM: CHEST  2 VIEW COMPARISON:  05/13/2017 FINDINGS: Negative for heart failure. Lungs are clear without infiltrate or effusion. Atherosclerotic aorta. Normal heart size. L1 compression fracture unchanged. IMPRESSION: No active cardiopulmonary disease. Electronically Signed   By: Franchot Gallo M.D.   On: 05/22/2017 13:28     IMPRESSION AND PLAN:   77 year old female with past medical history of rheumatoid arthritis, recent admission for a fall with L1 fracture and also a right clavicular fracture which was managed nonoperatively given her  comorbidities, asthma, anxiety, restless leg syndrome who presents to the hospital due to weakness, poor by mouth intake and noted to have urinary tract infection and also noted to have severe hypokalemia.  1. Urinary tract infection-this is the cause of patient's weakness, lethargy and poor by mouth intake. This is based off the urinalysis on admission. -We'll treat patient with IV ceftriaxone, follow urine cultures.  2. Hypokalemia-secondary to poor by mouth intake. We'll give the patient potassium orally and intravenously, recheck potassium in a.m., check magnesium level.  3. Generalized weakness/poor by mouth intake-secondary to underlying urinary tract infection also failure to thrive. -We'll give the patient IV fluids, IV antibiotic UTI, get a physical therapy consult to assess mobility. Continue Ensure supplements.  4. Hyperlipidemia-continue atorvastatin.  5. History of rheumatoid arthritis-continue Plaquenil.  6. Asthma-no acute exacerbation-continue Pulmicort nebs, albuterol inhaler as needed and Advair.  7. Restless leg syndrome-continue Requip.  8. Essential hypertension-continue Toprol.   All the records are reviewed and case discussed with ED provider. Management plans discussed with the patient, family and they are in agreement.  CODE STATUS: Full code  TOTAL TIME TAKING CARE OF THIS PATIENT: 40 minutes.    Henreitta Leber M.D on 05/22/2017 at 2:31 PM  Between 7am to 6pm - Pager - [phone]  After 6pm go to [email or web address] - password [email or web address] [place]  Office  [phone]  CC: Primary care physician; Remi Haggard, FNP

## 2017-05-22 NOTE — ED Notes (Signed)
Date and time results received: 05/22/17 1404   Test: troponin Critical Value: 0.03  Name of Provider Notified: Dr. Jacqualine Code  Orders Received? Or Actions Taken?: Orders Received - See Orders for details

## 2017-05-22 NOTE — ED Notes (Signed)
Date and time results received: 05/22/17 1405   Test: potassium Critical Value: 2.6  Name of Provider Notified: Dr. Jacqualine Code  Orders Received? Or Actions Taken?: Orders Received - See Orders for details

## 2017-05-23 ENCOUNTER — Inpatient Hospital Stay (HOSPITAL_COMMUNITY)
Admit: 2017-05-23 | Discharge: 2017-05-23 | Disposition: A | Discharge status: Home / Self Care | Payer: Medicare Other | Attending: Internal Medicine | Admitting: Internal Medicine

## 2017-05-23 ENCOUNTER — Inpatient Hospital Stay: Payer: Medicare Other

## 2017-05-23 ENCOUNTER — Encounter: Payer: Self-pay | Admitting: Infectious Disease

## 2017-05-23 DIAGNOSIS — I340 Nonrheumatic mitral (valve) insufficiency: Secondary | ICD-10-CM

## 2017-05-23 DIAGNOSIS — R7881 Bacteremia: Secondary | ICD-10-CM

## 2017-05-23 DIAGNOSIS — B9562 Methicillin resistant Staphylococcus aureus infection as the cause of diseases classified elsewhere: Secondary | ICD-10-CM

## 2017-05-23 DIAGNOSIS — L8990 Pressure ulcer of unspecified site, unspecified stage: Secondary | ICD-10-CM

## 2017-05-23 HISTORY — DX: Bacteremia: R78.81

## 2017-05-23 LAB — BLOOD CULTURE ID PANEL (REFLEXED)
Acinetobacter baumannii: NOT DETECTED
CANDIDA PARAPSILOSIS: NOT DETECTED
CANDIDA TROPICALIS: NOT DETECTED
Candida albicans: NOT DETECTED
Candida glabrata: NOT DETECTED
Candida krusei: NOT DETECTED
Enterobacter cloacae complex: NOT DETECTED
Enterobacteriaceae species: NOT DETECTED
Enterococcus species: NOT DETECTED
Escherichia coli: NOT DETECTED
HAEMOPHILUS INFLUENZAE: NOT DETECTED
KLEBSIELLA OXYTOCA: NOT DETECTED
KLEBSIELLA PNEUMONIAE: NOT DETECTED
Listeria monocytogenes: NOT DETECTED
Methicillin resistance: DETECTED — AB
NEISSERIA MENINGITIDIS: NOT DETECTED
PROTEUS SPECIES: NOT DETECTED
Pseudomonas aeruginosa: NOT DETECTED
STAPHYLOCOCCUS SPECIES: DETECTED — AB
STREPTOCOCCUS SPECIES: NOT DETECTED
Serratia marcescens: NOT DETECTED
Staphylococcus aureus (BCID): DETECTED — AB
Streptococcus agalactiae: NOT DETECTED
Streptococcus pneumoniae: NOT DETECTED
Streptococcus pyogenes: NOT DETECTED

## 2017-05-23 LAB — BASIC METABOLIC PANEL
Anion gap: 16 — ABNORMAL HIGH (ref 5–15)
BUN: 12 mg/dL (ref 6–20)
CHLORIDE: 98 mmol/L — AB (ref 101–111)
CO2: 21 mmol/L — AB (ref 22–32)
CREATININE: 0.35 mg/dL — AB (ref 0.44–1.00)
Calcium: 7.3 mg/dL — ABNORMAL LOW (ref 8.9–10.3)
GFR calc Af Amer: >60 mL/min (ref >60)
GFR calc non Af Amer: >60 mL/min (ref >60)
Glucose, Bld: 92 mg/dL (ref 65–99)
Potassium: 2.6 mmol/L — CL (ref 3.5–5.1)
Sodium: 135 mmol/L (ref 135–145)

## 2017-05-23 LAB — CBC
HEMATOCRIT: 32.6 % — AB (ref 35.0–47.0)
Hemoglobin: 11 g/dL — ABNORMAL LOW (ref 12.0–16.0)
MCH: 30.8 pg (ref 26.0–34.0)
MCHC: 33.8 g/dL (ref 32.0–36.0)
MCV: 91.1 fL (ref 80.0–100.0)
PLATELETS: 350 10*3/uL (ref 150–440)
RBC: 3.58 MIL/uL — ABNORMAL LOW (ref 3.80–5.20)
RDW: 13.7 % (ref 11.5–14.5)
WBC: 11.1 10*3/uL — ABNORMAL HIGH (ref 3.6–11.0)

## 2017-05-23 LAB — POTASSIUM: Potassium: 4.4 mmol/L (ref 3.5–5.1)

## 2017-05-23 LAB — MAGNESIUM: MAGNESIUM: 2 mg/dL (ref 1.7–2.4)

## 2017-05-23 MED ORDER — POTASSIUM CHLORIDE IN NACL 40-0.9 MEQ/L-% IV SOLN
INTRAVENOUS | Status: DC
  Administered 2017-05-23 (×2): 75 mL/h via INTRAVENOUS
  Filled 2017-05-23 (×3): qty 1000

## 2017-05-23 MED ORDER — VANCOMYCIN HCL IN DEXTROSE 750-5 MG/150ML-% IV SOLN
750.0000 mg | INTRAVENOUS | Status: DC
  Filled 2017-05-23: qty 150

## 2017-05-23 MED ORDER — POTASSIUM CHLORIDE CRYS ER 20 MEQ PO TBCR
40.0000 meq | EXTENDED_RELEASE_TABLET | ORAL | Status: AC
  Administered 2017-05-23 (×2): 40 meq via ORAL
  Filled 2017-05-23 (×2): qty 2

## 2017-05-23 MED ORDER — POTASSIUM CHLORIDE 10 MEQ/100ML IV SOLN
10.0000 meq | INTRAVENOUS | Status: AC
  Administered 2017-05-23 (×2): 10 meq via INTRAVENOUS
  Filled 2017-05-23 (×2): qty 100

## 2017-05-23 MED ORDER — METAXALONE 800 MG PO TABS
800.0000 mg | ORAL_TABLET | Freq: Three times a day (TID) | ORAL | Status: DC
  Administered 2017-05-23 – 2017-06-01 (×25): 800 mg via ORAL
  Filled 2017-05-23 (×29): qty 1

## 2017-05-23 MED ORDER — METOPROLOL SUCCINATE ER 50 MG PO TB24
50.0000 mg | ORAL_TABLET | Freq: Every day | ORAL | Status: DC
  Administered 2017-05-23 – 2017-05-31 (×8): 50 mg via ORAL
  Filled 2017-05-23 (×8): qty 1

## 2017-05-23 MED ORDER — IOPAMIDOL (ISOVUE-370) INJECTION 76%
100.0000 mL | Freq: Once | INTRAVENOUS | Status: AC | PRN
  Administered 2017-05-23: 100 mL via INTRAVENOUS

## 2017-05-23 MED ORDER — VANCOMYCIN HCL IN DEXTROSE 1-5 GM/200ML-% IV SOLN
1000.0000 mg | Freq: Once | INTRAVENOUS | Status: DC
  Filled 2017-05-23: qty 200

## 2017-05-23 MED ORDER — POTASSIUM CHLORIDE 10 MEQ/100ML IV SOLN
10.0000 meq | INTRAVENOUS | Status: DC | PRN
  Administered 2017-05-23: 10 meq via INTRAVENOUS
  Filled 2017-05-23: qty 100

## 2017-05-23 NOTE — Progress Notes (Signed)
Patient wants a muscle relaxer for back. Text paged MD>

## 2017-05-23 NOTE — Progress Notes (Signed)
ELECTROLYTE CONSULT NOTE - INITIAL   Pharmacy Consult for electrolyte monitoring/replacement Indication: hypokalemia  No Known Allergies  Patient Measurements: Height: 4\' 8"  (142.2 cm) Weight: 105 lb (47.6 kg) IBW/kg (Calculated) : 36.3  Vital Signs: Temp: 100.7 F (38.2 C) (02/16 0756) Temp Source: Oral (02/16 0756) BP: 160/77 (02/16 0756) Pulse Rate: 120 (02/16 0756) Intake/Output from previous day: 02/15 0701 - 02/16 0700 In: 256.7 [I.V.:106.7; IV Piggyback:150] Out: 500 [Urine:500] Intake/Output from this shift: Total I/O In: 366.3 [I.V.:366.3] Out: -   Labs: Recent Labs    05/22/17 1249 05/22/17 1329 05/23/17 0331  WBC 12.0*  --  11.1*  HGB 12.0  --  11.0*  HCT 35.4  --  32.6*  PLT 337  --  350  CREATININE 0.47  --  0.35*  MG  --  1.8 2.0   Estimated Creatinine Clearance: 38.5 mL/min (A) (by C-G formula based on SCr of 0.35 mg/dL (L)).  Assessment: Pharmacy consulted to assist with electrolyte monitoring/replacement in this 77 year old female admitted with UTI and FTT.  Goal of Therapy: Electrolytes WNL  Plan:  Mg = 2, no supplementation needed.  K = 2.6. Patient has received 30 mEq IV KCl. Ordered KCl 40 mEq PO every 4 hours x 2 doses.  Recheck K this afternoon--@1505  K=4.4, therapeutic; will check K with AM labs and replete as necessary   Candelaria Stagers, PharmD Pharmacy Resident  05/23/2017,3:58 PM

## 2017-05-23 NOTE — Progress Notes (Addendum)
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  Spoke with ID physician at [facility] who is covering for Dr. Ola Spurr.  Patient needs repeat blood cultures for follow-up, repeat blood cultures out to be negative at least for 48 to 72 hours hours before she can get a central line or a PICC line.  Continue IV vancomycin.  Echocardiogram is ordered follow the results.  Patient does have a cough but no hypoxia.  Will do CT chest also to evaluate for possible septic emboli.

## 2017-05-23 NOTE — H&P (Signed)
       INFECTIOUS DISEASE ATTENDING ADDENDUM:   Date: 05/23/2017  Patient name: Linda Walsh  Medical record number: [id number]  Date of birth: 06-Aug-1940      [name] 05/23/2017, 9:12 AM     [place] Antimicrobial Management Team Staphylococcus aureus bacteremia   Staphylococcus aureus bacteremia (SAB) is associated with a high rate of complications and mortality.  Specific aspects of clinical management are critical to optimizing the outcome of patients with SAB.  Therefore, the [facility] Antimicrobial Management Team [facility]) has initiated an intervention aimed at improving the management of SAB at [facility].  To do so, Infectious Diseases physicians are providing an evidence-based consult for the management of all patients with SAB.     Yes No Comments  Perform follow-up blood cultures (even if the patient is afebrile) to ensure clearance of bacteremia [x]  []  WILL CHECK TOMORROW SINCE SHE JUST STARTED ABX TODAY  Remove vascular catheter and obtain follow-up blood cultures after the removal of the catheter [x]  []  DO NOT PLACE PICC OR CENTRAL LINE FOR NEXT 5 DAYS AND UNTIL WE HAVE CLEARED BACTEREMIA  Perform echocardiography to evaluate for endocarditis (transthoracic ECHO is 40-50% sensitive, TEE is > 90% sensitive) [x]  []  Please keep in mind, that neither test can definitively EXCLUDE endocarditis, and that should clinical suspicion remain high for endocarditis the patient should then still be treated with an "endocarditis" duration of therapy = 6 weeks  Consult electrophysiologist to evaluate implanted cardiac device (pacemaker, ICD) []  []    Ensure source control []  []  Have all abscesses been drained effectively? Have deep seeded infections (septic joints or osteomyelitis) had appropriate surgical debridement?  SOURCE UNCLEAR, DECUBITUS ULCERS?  Investigate for "metastatic" sites of infection []  []  Does the patient have ANY symptom or physical exam finding that would  suggest a deeper infection (back or neck pain that may be suggestive of vertebral osteomyelitis or epidural abscess, muscle pain that could be a symptom of pyomyositis)?  Keep in mind that for deep seeded infections MRI imaging with contrast is preferred rather than other often insensitive tests such as plain x-rays, especially early in a patient's presentation.  Change antibiotic therapy to VANCOMYCIN []  []  Beta-lactam antibiotics are preferred for MSSA due to higher cure rates.   If on Vancomycin, goal trough should be 15 - 20 mcg/mL  Estimated duration of IV antibiotic therapy:  4-6 WEEKS []  []  Consult case management for probably prolonged outpatient IV antibiotic therapy   Case discussed with Dr. Vianne Bulls  My pager is [phone]

## 2017-05-23 NOTE — Progress Notes (Signed)
ELECTROLYTE CONSULT NOTE - INITIAL   Pharmacy Consult for electrolyte monitoring/replacement Indication: hypokalemia  No Known Allergies  Patient Measurements: Height: 4\' 8"  (142.2 cm) Weight: 105 lb (47.6 kg) IBW/kg (Calculated) : 36.3  Vital Signs: Temp: 100.7 F (38.2 C) (02/16 0756) Temp Source: Oral (02/16 0756) BP: 160/77 (02/16 0756) Pulse Rate: 120 (02/16 0756) Intake/Output from previous day: 02/15 0701 - 02/16 0700 In: 256.7 [I.V.:106.7; IV Piggyback:150] Out: 500 [Urine:500] Intake/Output from this shift: No intake/output data recorded.  Labs: Recent Labs    05/22/17 1249 05/22/17 1329 05/23/17 0331  WBC 12.0*  --  11.1*  HGB 12.0  --  11.0*  HCT 35.4  --  32.6*  PLT 337  --  350  CREATININE 0.47  --  0.35*  MG  --  1.8 2.0   Estimated Creatinine Clearance: 38.5 mL/min (A) (by C-G formula based on SCr of 0.35 mg/dL (L)).  Assessment: Pharmacy consulted to assist with electrolyte monitoring/replacement in this 77 year old female admitted with UTI and FTT.  Goal of Therapy: Electrolytes WNL  Plan:  Mg = 2, no supplementation needed.  K = 2.6. Patient has received 30 mEq IV KCl. Ordered KCl 40 mEq PO every 4 hours x 2 doses.  Will recheck K this afternoon.  Lenis Noon, PharmD, BCPS Clinical Pharmacist 05/23/2017,8:51 AM

## 2017-05-23 NOTE — Progress Notes (Signed)
PT Cancellation Note  Patient Details Name: Linda Walsh MRN: [id number] DOB: 1940-10-11   Cancelled Treatment:    Reason Eval/Treat Not Completed: Patient not medically ready. Received consult for PT. Pt currently with 100.7 temp, increased HR to 120, and low K+. Pt also pending imaging for PE. Not appropriate for PT evaluation at this time. Will hold this date.   Deziya Amero 05/23/2017, 9:32 AM  Greggory Stallion, PT, DPT [phone]

## 2017-05-23 NOTE — Progress Notes (Signed)
PHARMACY - PHYSICIAN COMMUNICATION CRITICAL VALUE ALERT - BLOOD CULTURE IDENTIFICATION (BCID)  Linda Walsh is an 77 y.o. female who presented to [facility] on 05/22/2017 with a chief complaint of FTT, weakness  Assessment:  Tachycardic, WBC 12.0 >> 11.1, CXR clear  (include suspected source if known)  Name of physician (or Provider) Contacted: Montell Salary  Current antibiotics: Ceftriaxone  Changes to prescribed antibiotics recommended:  Recommendations accepted by provider -- Will add vancomycin for 3/4 GPC BCID Staph MRSA Will start patient on vanc 1g IV x 1 followed by Vanc 750 mg IV q24h with a vanc trough 02/19 @ 0500 prior to 4th dose.  Ke 0.0363 T1/2 19 ~ 24 hrs Goal trough 15 - 20 mcg/mL  Results for orders placed or performed during the hospital encounter of 05/22/17  Blood Culture ID Panel (Reflexed) (Collected: 05/22/2017  2:13 PM)  Result Value Ref Range   Enterococcus species NOT DETECTED NOT DETECTED   Listeria monocytogenes NOT DETECTED NOT DETECTED   Staphylococcus species DETECTED (A) NOT DETECTED   Staphylococcus aureus DETECTED (A) NOT DETECTED   Methicillin resistance DETECTED (A) NOT DETECTED   Streptococcus species NOT DETECTED NOT DETECTED   Streptococcus agalactiae NOT DETECTED NOT DETECTED   Streptococcus pneumoniae NOT DETECTED NOT DETECTED   Streptococcus pyogenes NOT DETECTED NOT DETECTED   Acinetobacter baumannii NOT DETECTED NOT DETECTED   Enterobacteriaceae species NOT DETECTED NOT DETECTED   Enterobacter cloacae complex NOT DETECTED NOT DETECTED   Escherichia coli NOT DETECTED NOT DETECTED   Klebsiella oxytoca NOT DETECTED NOT DETECTED   Klebsiella pneumoniae NOT DETECTED NOT DETECTED   Proteus species NOT DETECTED NOT DETECTED   Serratia marcescens NOT DETECTED NOT DETECTED   Haemophilus influenzae NOT DETECTED NOT DETECTED   Neisseria meningitidis NOT DETECTED NOT DETECTED   Pseudomonas aeruginosa NOT DETECTED NOT DETECTED   Candida  albicans NOT DETECTED NOT DETECTED   Candida glabrata NOT DETECTED NOT DETECTED   Candida krusei NOT DETECTED NOT DETECTED   Candida parapsilosis NOT DETECTED NOT DETECTED   Candida tropicalis NOT DETECTED NOT DETECTED   Tobie Lords, PharmD, BCPS Clinical Pharmacist 05/23/2017

## 2017-05-23 NOTE — Progress Notes (Signed)
[facility] NAME: Cole Klugh    MR#:  [id number]  DATE OF BIRTH:  03/29/1941  SUBJECTIVE: Admitted for failure to thrive, weakness and found to have UTI, hypokalemia.  Patient has cough and complains of ribpains and also feeling cold and want extra blankets.  CHIEF COMPLAINT:   Chief Complaint  Patient presents with  . Failure To Thrive    REVIEW OF SYSTEMS:   ROS CONSTITUTIONAL: No fever, fatigue, generalized weakness EYES: No blurred or double vision.  EARS, NOSE, AND THROAT: No tinnitus or ear pain.  RESPIRATORY: Cough,  No shortness of breath, wheezing or hemoptysis.  CARDIOVASCULAR: No chest pain, orthopnea, edema.  GASTROINTESTINAL: No nausea, vomiting, diarrhea or abdominal pain.  GENITOURINARY: No dysuria, hematuria.  ENDOCRINE: No polyuria, nocturia,  HEMATOLOGY: No anemia, easy bruising or bleeding SKIN: No rash or lesion. MUSCULOSKELETAL: Diffuse joint pains NEUROLOGIC: No tingling, numbness, weakness.  PSYCHIATRY: No anxiety or depression.   DRUG ALLERGIES:  No Known Allergies  VITALS:  Blood pressure (!) 147/81, pulse (!) 118, temperature 98.8 F (37.1 C), temperature source Oral, resp. rate 20, height 4\' 8"  (1.422 m), weight 47.6 kg (105 lb), SpO2 94 %.  PHYSICAL EXAMINATION:  GENERAL:  77 y.o.-year-old patient lying in the bed with no acute distress.  Very  Frail individual EYES: Pupils equal, round, reactive to light.No scleral icterus. Extraocular muscles intact.  HEENT: Head atraumatic, normocephalic. Oropharynx and nasopharynx clear.  NECK:  Supple, no jugular venous distention. No thyroid enlargement, no tenderness.  LUNGS: Normal breath sounds bilaterally, no wheezing, rales,rhonchi or crepitation. No use of accessory muscles of respiration.  CARDIOVASCULAR: S1, S2 tachycardic.Marland Kitchen No murmurs, rubs, or gallops.  ABDOMEN: Soft, nontender, nondistended. Bowel sounds present. No organomegaly or  mass.  EXTREMITIES: No pedal edema, cyanosis, or clubbing.  NEUROLOGIC: Cranial nerves II through XII are intact. Muscle strength 5/5 in all extremities. Sensation intact. Gait not checked.  PSYCHIATRIC: The patient is alert and oriented x 3.  SKIN: No obvious rash, lesion, or ulcer.    LABORATORY PANEL:   CBC Recent Labs  Lab 05/23/17 0331  WBC 11.1*  HGB 11.0*  HCT 32.6*  PLT 350   ------------------------------------------------------------------------------------------------------------------  Chemistries  Recent Labs  Lab 05/22/17 1329 05/23/17 0331  NA  --  135  K  --  2.6*  CL  --  98*  CO2  --  21*  GLUCOSE  --  92  BUN  --  12  CREATININE  --  0.35*  CALCIUM  --  7.3*  MG 1.8  --    ------------------------------------------------------------------------------------------------------------------  Cardiac Enzymes Recent Labs  Lab 05/22/17 1249  TROPONINI 0.03*   ------------------------------------------------------------------------------------------------------------------  RADIOLOGY:  Dg Chest 2 View  Result Date: 05/22/2017 CLINICAL DATA:  Fatigue, recent flu EXAM: CHEST  2 VIEW COMPARISON:  05/13/2017 FINDINGS: Negative for heart failure. Lungs are clear without infiltrate or effusion. Atherosclerotic aorta. Normal heart size. L1 compression fracture unchanged. IMPRESSION: No active cardiopulmonary disease. Electronically Signed   By: Franchot Gallo M.D.   On: 05/22/2017 13:28    EKG:   Orders placed or performed during the hospital encounter of 05/22/17  . ED EKG  . ED EKG  . EKG 12-Lead  . EKG 12-Lead    ASSESSMENT AND PLAN:  77 year old female with past medical history of rheumatoid arthritis, recent L1 fracture, right clavicle fracture managed nonoperatively and also has asthma, anxiety, restless leg syndrome comes from group home because  of generalized weakness, poor p.o. intake and found to have severe hypokalemia, UTI. 1.  UTI: Patient  is on vancomycin, follow urine cultures, 2.  Severe hypokalemia secondary to poor p.o. intake.  Patient is getting potassium, magnesium supplements but still has hypokalemia continue to replace potassium, pharmacy consult for operative management. 3.  Generalized weakness: Get physical therapy consult of her potassium gets corrected. 4.  Hyperlipidemia: Continue atorvastatin. 5.  Asthma: No wheezing.  But does have cough.  Continue bronchodilators, Tussionex. 6.  History of rheumatoid arthritis: Continue Plaquenil. 7.  Restless leg syndrome: Continue Requip. Essential hypertension and now has tachycardia continue Toprol-XL.      All the records are reviewed and case discussed with Care Management/Social Workerr. Management plans discussed with the patient, family and they are in agreement.  CODE STATUS: Full code  TOTAL TIME TAKING CARE OF THIS PATIENT: 35 minutes.   POSSIBLE D/C IN 1-2 DAYS, DEPENDING ON CLINICAL CONDITION.   Epifanio Lesches M.D on 05/23/2017 at 7:38 AM  Between 7am to 6pm - Pager - [phone]  After 6pm go to [email or web address] - password [email or web address] [place] [facility]  Office  [phone]  CC: Primary care physician; Remi Haggard, FNP   Note: This dictation was prepared with Dragon dictation along with smaller phrase technology. Any transcriptional errors that result from this process are unintentional.

## 2017-05-23 NOTE — Progress Notes (Signed)
Potassium 2.6 MD notified. Orders received.

## 2017-05-24 LAB — ECHOCARDIOGRAM COMPLETE
HEIGHTINCHES: 56 in
WEIGHTICAEL: 1680 [oz_av]

## 2017-05-24 LAB — BASIC METABOLIC PANEL
ANION GAP: 7 (ref 5–15)
BUN: 8 mg/dL (ref 6–20)
CO2: 22 mmol/L (ref 22–32)
Calcium: 7.5 mg/dL — ABNORMAL LOW (ref 8.9–10.3)
Chloride: 104 mmol/L (ref 101–111)
Creatinine, Ser: 0.38 mg/dL — ABNORMAL LOW (ref 0.44–1.00)
GFR calc Af Amer: >60 mL/min (ref >60)
GLUCOSE: 70 mg/dL (ref 65–99)
POTASSIUM: 4.6 mmol/L (ref 3.5–5.1)
Sodium: 133 mmol/L — ABNORMAL LOW (ref 135–145)

## 2017-05-24 MED ORDER — VANCOMYCIN HCL IN DEXTROSE 1-5 GM/200ML-% IV SOLN
1000.0000 mg | INTRAVENOUS | Status: AC
  Administered 2017-05-24: 1000 mg via INTRAVENOUS
  Filled 2017-05-24 (×2): qty 200

## 2017-05-24 MED ORDER — ADULT MULTIVITAMIN W/MINERALS CH
1.0000 | ORAL_TABLET | Freq: Every day | ORAL | Status: DC
  Administered 2017-05-25 – 2017-05-28 (×4): 1 via ORAL
  Filled 2017-05-24 (×4): qty 1

## 2017-05-24 MED ORDER — DEXTROSE-NACL 5-0.45 % IV SOLN
INTRAVENOUS | Status: DC
  Administered 2017-05-24 – 2017-05-25 (×3): via INTRAVENOUS

## 2017-05-24 MED ORDER — VANCOMYCIN HCL IN DEXTROSE 750-5 MG/150ML-% IV SOLN
750.0000 mg | INTRAVENOUS | Status: DC
  Administered 2017-05-25 – 2017-05-27 (×3): 750 mg via INTRAVENOUS
  Filled 2017-05-24 (×4): qty 150

## 2017-05-24 MED ORDER — COLLAGENASE 250 UNIT/GM EX OINT
TOPICAL_OINTMENT | Freq: Every day | CUTANEOUS | Status: DC
  Administered 2017-05-24 – 2017-06-02 (×11): via TOPICAL
  Filled 2017-05-24: qty 30

## 2017-05-24 NOTE — Progress Notes (Signed)
Initial Nutrition Assessment  DOCUMENTATION CODES:   Not applicable  INTERVENTION:  Continue Ensure Enlive po BID, each supplement provides 350 kcal and 20 grams of protein.  Provide Magic cup TID with meals, each supplement provides 290 kcal and 9 grams of protein.  Provide daily MVI.  NUTRITION DIAGNOSIS:   Increased nutrient needs related to wound healing as evidenced by estimated needs.  GOAL:   Patient will meet greater than or equal to 90% of their needs  MONITOR:   PO intake, Supplement acceptance, Labs, Weight trends, Skin, I & O's  REASON FOR ASSESSMENT:   Malnutrition Screening Tool    ASSESSMENT:   77 year old female with PMHx of RA, recent L1 fracture, right clavicle fracture, asthma, anxiety, restless leg syndrome who presents with generalized weakness, poor PO intake, and found to have MRSA bacteremia, CT chest concerning for malignancy, pressure injury to right gluteal region.   Met with patient at bedside. She was sleeping but woke to name call. Prefers to be called Linda Walsh. She reports she has not been eating well but unable to provide any further details. Per RD note from 10 days ago patient has had a poor appetite for one month. Per RN patient has only had 1/2 cup of milk today and has refused anything else to eat. Patient reports to this RD she just wants to sleep today and not eat. Encouraged patient to try some Ensure and [employer]. She reports she likes the vanilla flavor.  Medications reviewed and include: vitamin D 1000 units daily, ferrous sulfate 325 mg BID, Remeron 30 mg daily, pantoprazole, Senokot-S, ceftriaxone, D5-1/2NS at 75 mL/hr, vancomycin.  Labs reviewed: Sodium 133.  NUTRITION - FOCUSED PHYSICAL EXAM:    Most Recent Value  Orbital Region  Mild depletion  Upper Arm Region  Mild depletion  Thoracic and Lumbar Region  No depletion  Buccal Region  No depletion  Temple Region  Mild depletion  Clavicle Bone Region  No depletion  Clavicle  and Acromion Bone Region  No depletion  Scapular Bone Region  No depletion  Dorsal Hand  No depletion  Patellar Region  Mild depletion  Anterior Thigh Region  Mild depletion  Posterior Calf Region  No depletion  Edema (RD Assessment)  None  Hair  Reviewed  Eyes  Reviewed  Mouth  Reviewed  Skin  Reviewed  Nails  Reviewed     Diet Order:  Diet regular Room service appropriate? Yes; Fluid consistency: Thin  EDUCATION NEEDS:   Not appropriate for education at this time  Skin:  Skin Assessment: Skin Integrity Issues: Skin Integrity Issues:: Stage II Stage II: right buttocks  Last BM:  PTA (05/20/2017 per chart)  Height:   Ht Readings from Last 1 Encounters:  05/22/17 4' 8"  (1.422 m)    Weight:   Wt Readings from Last 1 Encounters:  05/22/17 105 lb (47.6 kg)    Ideal Body Weight:  42.4 kg  BMI:  Body mass index is 23.54 kg/m.  Estimated Nutritional Needs:   Kcal:  1200-1430 (25-30 kcal/kg)  Protein:  57-67 grams (1.2-1.4 grams/kg)  Fluid:  1.2 L/day (25 mL/kg)  Willey Blade, MS, RD, LDN Office: [phone] Pager: [phone] After Hours/Weekend Pager: [phone]

## 2017-05-24 NOTE — Progress Notes (Signed)
Spoke with patient's son Mr.Rick Lunde over the phone and explained about her MRSA bacteremia, spot in lung and further workup with pulmonologist.  I spoke with Dr. Smitty Cords the phone, want to wait for pulmonary, biopsy, wanted to see in the office  regarding PET scan, further workup.

## 2017-05-24 NOTE — Clinical Social Work Note (Signed)
Clinical Social Work Assessment  Patient Details  Name: Linda Walsh MRN: [id number] Date of Birth: 11/17/1940  Date of referral:  05/24/17               Reason for consult:  Facility Placement                Permission sought to share information with:  Facility Art therapist granted to share information::  Yes, Verbal Permission Granted  Name::        Agency::  [facility]  Relationship::     Contact Information:     Housing/Transportation Living arrangements for the past 2 months:  Group Home Source of Information:  Adult Children, Medical Team Patient Interpreter Needed:  None Criminal Activity/Legal Involvement Pertinent to Current Situation/Hospitalization:    Significant Relationships:  Adult Children, Community Support Lives with:  Facility Resident Do you feel safe going back to the place where you live?  Yes Need for family participation in patient care:  No (Coment)  Care giving concerns:  PT recommendation for STR/Patient admitted from ALF   Social Worker assessment / plan:  CSW attempted to meet with the patient at bedside; however, she was resting after PT and not alert enough for an assessment. The CSW contacted the patient's son, Linda Walsh, who confirmed that the patient is a resident at [facility] and would like to return. The CSW explained the PT recommendation for STR; Linda Walsh gave permission to make a referral; however, he indicated that he mother would prefer home health RN and PT at the facility. The CSW explained that most group homes and ALFs would require her to be mobile if they accept her back, and the patient is not mobile at this time. Ricky verbalized understanding.  The CSW attempted to contact the group home owner Linda Walsh and was not able to reach her or leave a HIPPA compliant voice mail. The CSW will discuss discharge planning further with [facility] tomorrow as he plans to visit his mother.   Employment status:   Retired Nurse, adult PT Recommendations:  [place] / Referral to community resources:  [place]  Patient/Family's Response to care:  The family thanked the CSW.  Patient/Family's Understanding of and Emotional Response to Diagnosis, Current Treatment, and Prognosis:  The family understands the PT recommendation and wants to wait for his mother to make the determination.  Emotional Assessment Appearance:  Appears stated age Attitude/Demeanor/Rapport:  Lethargic Affect (typically observed):  Stable Orientation:  Oriented to Self, Oriented to Place, Oriented to Situation Alcohol / Substance use:  Never Used Psych involvement (Current and /or in the community):  No (Comment)  Discharge Needs  Concerns to be addressed:  Care Coordination, Discharge Planning Concerns Readmission within the last 30 days:  Yes Current discharge risk:  Chronically ill Barriers to Discharge:  Continued Medical Work up   [employer], LCSW 05/24/2017, 2:00 PM

## 2017-05-24 NOTE — Progress Notes (Signed)
       INFECTIOUS DISEASE ATTENDING ADDENDUM:   Date: 05/24/2017  Patient name: Linda Walsh  Medical record number: [id number]  Date of birth: 1941-02-14    Linda Walsh    05/24/2017, 2:25 PM        [place] Antimicrobial Management Team Staphylococcus aureus bacteremia   Staphylococcus aureus bacteremia (SAB) is associated with a high rate of complications and mortality.  Specific aspects of clinical management are critical to optimizing the outcome of patients with SAB.  Therefore, the [facility] Antimicrobial Management Team [facility]) has initiated an intervention aimed at improving the management of SAB at [facility].  To do so, Infectious Diseases physicians are providing an evidence-based consult for the management of all patients with SAB.     Yes No Comments  Perform follow-up blood cultures (even if the patient is afebrile) to ensure clearance of bacteremia [x]  []   I am checking blood cultures today but I doubt that will be clear for several days will likely have to repeat them again tomorrow  Remove vascular catheter and obtain follow-up blood cultures after the removal of the catheter [x]  []  DO NOT PLACE PICC OR CENTRAL LINE FOR NEXT 5-6 DAYS AND UNTIL WE HAVE CLEARED BACTEREMIA  Perform echocardiography to evaluate for endocarditis (transthoracic ECHO is 40-50% sensitive, TEE is > 90% sensitive) [x]  []  Please keep in mind, that neither test can definitively EXCLUDE endocarditis, and that should clinical suspicion remain high for endocarditis the patient should then still be treated with an "endocarditis" duration of therapy = 6 weeks  Consult electrophysiologist to evaluate implanted cardiac device (pacemaker, ICD) []  []    Ensure source control []  []  Have all abscesses been drained effectively? Have deep seeded infections (septic joints or osteomyelitis) had appropriate surgical debridement?  SOURCE UNCLEAR, DECUBITUS ULCERS or lungs since she has an area that was  read as possibly being a bronchogenic carcinoma but may be this could be a lung abscess?  Investigate for "metastatic" sites of infection []  []  Does the patient have ANY symptom or physical exam finding that would suggest a deeper infection (back or neck pain that may be suggestive of vertebral osteomyelitis or epidural abscess, muscle pain that could be a symptom of pyomyositis)?  Keep in mind that for deep seeded infections MRI imaging with contrast is preferred rather than other often insensitive tests such as plain x-rays, especially early in a patient's presentation.  Change antibiotic therapy to VANCOMYCIN []  []  Beta-lactam antibiotics are preferred for MSSA due to higher cure rates.   If on Vancomycin, goal trough should be 15 - 20 mcg/mL  Estimated duration of IV antibiotic therapy:  4-6 WEEKS []  []  Consult case management for probably prolonged outpatient IV antibiotic therapy   Case discussed with Dr. Vianne Bulls  I will pass along to Dr. Ola Spurr who should be returning tomorrow.   My pager is [phone]

## 2017-05-24 NOTE — Progress Notes (Signed)
Pharmacy Antibiotic Note  Linda Walsh is a 77 y.o. female admitted on 05/22/2017 with MRSA bacteremia.  Pharmacy has been consulted for vancomycin dosing.  Plan: Vancomycin dose not charted as being given on 2/16. Will order vancomycin 1000 mg (~25 mg/kg) IV STAT followed by vancomycin 750 mg IV q24h.  Goal VT 15-20 mcg/mL  Kinetics: adjusted body weight = 41 kg, CrCl 60 mL/min Ke: 0.054 Half-life: 12.5 hrs Vd: 28 L Cmin (estimated) ~15 mcg/mL  Height: 4\' 8"  (142.2 cm) Weight: 105 lb (47.6 kg) IBW/kg (Calculated) : 36.3  Temp (24hrs), Avg:99.3 F (37.4 C), Min:98.4 F (36.9 C), Max:100.7 F (38.2 C)  Recent Labs  Lab 05/22/17 1249 05/22/17 1414 05/23/17 0331 05/24/17 0348  WBC 12.0*  --  11.1*  --   CREATININE 0.47  --  0.35* 0.38*  LATICACIDVEN  --  1.3  --   --     Estimated Creatinine Clearance: 38.5 mL/min (A) (by C-G formula based on SCr of 0.38 mg/dL (L)).    No Known Allergies  Antimicrobials this admission: vancomycin 2/17 >>  ceftriaxone 2/15 >> 2/16  Dose adjustments this admission:  Microbiology results: 2/15 BCx: 4/4 MRSA  2/15 UCx: Sent   Thank you for allowing pharmacy to be a part of this patient's care.  Lenis Noon, PharmD, BCPS Clinical Pharmacist 05/24/2017 7:54 AM

## 2017-05-24 NOTE — Evaluation (Addendum)
Physical Therapy Evaluation Patient Details Name: Linda Walsh MRN: [id number] DOB: 02-Oct-1940 Today's Date: 05/24/2017   History of Present Illness  Pt admitted for complaints of weakness and now with UTI. Pt with history of cardiomyopathy, RA, depression and recently hospitalized for flue. Of note, pt with recent fall (05/14/17) with L1 fracture and R clavicular fx mangaged non-op.  Clinical Impression  Pt is a pleasant 77 year old female who was admitted for UTI. Pt lethargic upon arrival, however awakens with cues. Pt performs bed mobility with mod/max assist, unable to further tolerate upright posture. Back brace not in room, limiting further mobility as well. Pt demonstrates deficits with strength/mobility/pain. Pt currently not at baseline level, unable to ambulate at this time. Would benefit from skilled PT to address above deficits and promote optimal return to PLOF; recommend transition to STR upon discharge from acute hospitalization.       Follow Up Recommendations SNF    Equipment Recommendations  ([email or web address])    Recommendations for Other Services       Precautions / Restrictions Precautions Precautions: Fall Other Brace/Splint: TLSO brace for OOB Restrictions Weight Bearing Restrictions: Yes RUE Weight Bearing: Non weight bearing Other Position/Activity Restrictions: per chart review      Mobility  Bed Mobility Overal bed mobility: Needs Assistance Bed Mobility: Supine to Sit     Supine to sit: Mod assist Sit to supine: Mod assist;Max assist   General bed mobility comments: needs assist for initiating movement towards EOB. Once seated at EOB, only able to sit for a few seconds and then needed to lie down. Back brace not in room, limited further moblity at this time.  Transfers                 General transfer comment: unable  Ambulation/Gait             General Gait Details: unable  Stairs            Wheelchair Mobility    Modified  Rankin (Stroke Patients Only)       Balance Overall balance assessment: Needs assistance Sitting-balance support: Single extremity supported Sitting balance-Leahy Scale: Poor                                       Pertinent Vitals/Pain Pain Assessment: Faces Faces Pain Scale: Hurts even more Pain Location: mid back Pain Descriptors / Indicators: Aching Pain Intervention(s): Limited activity within patient's tolerance;Repositioned    Home Living Family/patient expects to be discharged to:: Group home                 Additional Comments: reports she has assistance at group home, however not able to elaborate and is lethargic    Prior Function Level of Independence: Independent with assistive device(s);Needs assistance   Gait / Transfers Assistance Needed: ambulates with SPC but occasionally does not use  ADL's / Homemaking Assistance Needed: states she does not typically need a lot of assist in the group home, but does endorse need for assist with shower transfer and bathing, able to sponge bath independently, dresses with modified independence   Comments: states she does not typically need a lot of assist in the group home     Hand Dominance   Dominant Hand: Right    Extremity/Trunk Assessment   Upper Extremity Assessment Upper Extremity Assessment: Generalized weakness(B UE grip 3/5; no sling in room)  Lower Extremity Assessment Lower Extremity Assessment: Generalized weakness(B LE grossly 3-/5)       Communication   Communication: HOH  Cognition Arousal/Alertness: Lethargic Behavior During Therapy: WFL for tasks assessed/performed Overall Cognitive Status: Within Functional Limits for tasks assessed                                        General Comments      Exercises Other Exercises Other Exercises: supine ther-ex on B LE including ankle pumps, quad sets, SLRs, and hip abd/add. ALl ther-ex performed x 10 reps with  min/mod assist. Cues given for sequencing and attention to task.   Assessment/Plan    PT Assessment Patient needs continued PT services  PT Problem List Decreased strength;Decreased range of motion;Decreased activity tolerance;Decreased balance;Decreased mobility;Decreased safety awareness;Decreased knowledge of use of DME;Decreased knowledge of precautions;Decreased cognition;Decreased coordination;Pain       PT Treatment Interventions DME instruction;Gait training;Functional mobility training;Therapeutic activities;Therapeutic exercise;Balance training;Neuromuscular re-education;Patient/family education    PT Goals (Current goals can be found in the Care Plan section)  Acute Rehab PT Goals Patient Stated Goal: get stronger PT Goal Formulation: With patient Time For Goal Achievement: 06/07/17 Potential to Achieve Goals: Fair    Frequency Min 2X/week   Barriers to discharge Inaccessible home environment;Decreased caregiver support      Co-evaluation               AM-PAC PT "6 Clicks" Daily Activity  Outcome Measure Difficulty turning over in bed (including adjusting bedclothes, sheets and blankets)?: Unable Difficulty moving from lying on back to sitting on the side of the bed? : Unable Difficulty sitting down on and standing up from a chair with arms (e.g., wheelchair, bedside commode, etc,.)?: Unable Help needed moving to and from a bed to chair (including a wheelchair)?: Total Help needed walking in hospital room?: Total Help needed climbing 3-5 steps with a railing? : Total 6 Click Score: 6    End of Session   Activity Tolerance: Patient limited by pain;Patient limited by fatigue Patient left: in bed;with bed alarm set Nurse Communication: Mobility status PT Visit Diagnosis: Muscle weakness (generalized) (M62.81);Difficulty in walking, not elsewhere classified (R26.2);Pain Pain - Right/Left: Right Pain - part of body: Shoulder    Time: 9628-3662 PT Time  Calculation (min) (ACUTE ONLY): 18 min   Charges:   PT Evaluation $PT Eval Low Complexity: 1 Low PT Treatments $Therapeutic Exercise: 8-22 mins   PT G CodesGreggory Stallion, PT, DPT [phone]   [name] 05/24/2017, 12:25 PM

## 2017-05-24 NOTE — Progress Notes (Signed)
[facility] [place] NAME: Linda Walsh    MR#:  [id number]  DATE OF BIRTH:  1941-01-25  SUBJECTIVE: Admitted for failure to thrive, weakness and found to have UTI, hypokalemia.  Patient found to have MRSA bacteremia, CT chest showed no PE but concerning for possible malignancy.  Patient also found to have a small decubitus ulcer on the right buttock.  Low-grade temperature yesterday.  Continue to have poor p.o. intake  CHIEF COMPLAINT:   Chief Complaint  Patient presents with  . Failure To Thrive    REVIEW OF SYSTEMS:   ROS CONSTITUTIONAL: Low-grade temperature  EYES: No blurred or double vision.  EARS, NOSE, AND THROAT: No tinnitus or ear pain.  RESPIRATORY: Cough,  No shortness of breath, wheezing or hemoptysis.  CARDIOVASCULAR: No chest pain, orthopnea, edema.  GASTROINTESTINAL: No nausea, vomiting, diarrhea or abdominal pain.  GENITOURINARY: No dysuria, hematuria.  ENDOCRINE: No polyuria, nocturia,  HEMATOLOGY: No anemia, easy bruising or bleeding SKIN: Small decubiti on the right buttock MUSCULOSKELETAL: Diffuse joint pains NEUROLOGIC: No tingling, numbness, weakness.  PSYCHIATRY: No anxiety or depression.   DRUG ALLERGIES:  No Known Allergies  VITALS:  Blood pressure (!) 152/75, pulse (!) 107, temperature 98.8 F (37.1 C), temperature source Oral, resp. rate 18, height 4\' 8"  (1.422 m), weight 47.6 kg (105 lb), SpO2 95 %.  PHYSICAL EXAMINATION:  GENERAL:  77 y.o.-year-old patient lying in the bed with no acute distress.  Very  Frail individual EYES: Pupils equal, round, reactive to light.No scleral icterus. Extraocular muscles intact.  HEENT: Head atraumatic, normocephalic. Oropharynx and nasopharynx clear.  NECK:  Supple, no jugular venous distention. No thyroid enlargement, no tenderness.  LUNGS: Normal breath sounds bilaterally, no wheezing, rales,rhonchi or crepitation. No use of accessory muscles of respiration.   CARDIOVASCULAR: S1, S2 tachycardic.Marland Kitchen No murmurs, rubs, or gallops.  ABDOMEN: Soft, nontender, nondistended. Bowel sounds present. No organomegaly or mass.  EXTREMITIES: No pedal edema, cyanosis, or clubbing.  NEUROLOGIC: Cranial nerves II through XII are intact. Muscle strength 5/5 in all extremities. Sensation intact. Gait not checked.  PSYCHIATRIC: The patient is alert and oriented x 3.  SKIN: Sacral decubiti on the right buttock with some eschar in the center, slight pus around the periphery but patient has no tenderness the size around 3-2 cm.  Consult wound care for staging, dressing recommendations.   LABORATORY PANEL:   CBC Recent Labs  Lab 05/23/17 0331  WBC 11.1*  HGB 11.0*  HCT 32.6*  PLT 350   ------------------------------------------------------------------------------------------------------------------  Chemistries  Recent Labs  Lab 05/23/17 0331  05/24/17 0348  NA 135  --  133*  K 2.6*   < > 4.6  CL 98*  --  104  CO2 21*  --  22  GLUCOSE 92  --  70  BUN 12  --  8  CREATININE 0.35*  --  0.38*  CALCIUM 7.3*  --  7.5*  MG 2.0  --   --    < > = values in this interval not displayed.   ------------------------------------------------------------------------------------------------------------------  Cardiac Enzymes Recent Labs  Lab 05/22/17 1249  TROPONINI 0.03*   ------------------------------------------------------------------------------------------------------------------  RADIOLOGY:  Dg Chest 2 View  Result Date: 05/22/2017 CLINICAL DATA:  Fatigue, recent flu EXAM: CHEST  2 VIEW COMPARISON:  05/13/2017 FINDINGS: Negative for heart failure. Lungs are clear without infiltrate or effusion. Atherosclerotic aorta. Normal heart size. L1 compression fracture unchanged. IMPRESSION: No active cardiopulmonary disease. Electronically Signed   By:  Franchot Gallo M.D.   On: 05/22/2017 13:28   Ct Angio Chest Pe W Or Wo Contrast  Result Date:  05/23/2017 CLINICAL DATA:  Evaluate for pulmonary embolus. EXAM: CT ANGIOGRAPHY CHEST WITH CONTRAST TECHNIQUE: Multidetector CT imaging of the chest was performed using the standard protocol during bolus administration of intravenous contrast. Multiplanar CT image reconstructions and MIPs were obtained to evaluate the vascular anatomy. CONTRAST:  147mL ISOVUE-370 IOPAMIDOL (ISOVUE-370) INJECTION 76% COMPARISON:  None. FINDINGS: Cardiovascular: The heart size appears normal. No pericardial effusion. Aortic atherosclerosis noted. Calcification within the LAD coronary artery is noted. The main pulmonary artery appears patent. No saddle embolus or central obstructing emboli identified. No lobar or segmental pulmonary artery filling defects identified. Mediastinum/Nodes: Normal appearance of the thyroid gland. The trachea appears patent and is midline. Normal appearance of the esophagus. No enlarged mediastinal or hilar lymph nodes. Lungs/Pleura: No pleural effusion. Dependent changes and subsegmental atelectasis noted in the left base. Within the medial right upper lobe there is a necrotic appearing lung mass which extends into the posterior mediastinum posterior to the trachea and has mass effect upon the proximal esophagus. This measures 3.0 by 4.0 by 3.9 cm. Several satellite nodules are identified within the right upper lobe, the largest appears cavitary measuring 1.2 cm, image 17 of series 10. Upper Abdomen: No acute abnormality. Musculoskeletal: No suspicious bone lesions. No acute bone abnormality. Review of the MIP images confirms the above findings. IMPRESSION: 1. Negative for acute pulmonary embolus. 2. Mass within the medial right upper lobe is identified invading the posterior mediastinum which is suspicious for primary bronchogenic carcinoma. Several additional satellite nodules are noted within the right upper lobe. Further evaluation with PET-CT and tissue sampling is advised. 3. Aortic atherosclerosis  and coronary artery calcifications. Aortic Atherosclerosis (ICD10-I70.0). Electronically Signed   By: Kerby Moors M.D.   On: 05/23/2017 11:32    EKG:   Orders placed or performed during the hospital encounter of 05/22/17  . ED EKG  . ED EKG  . EKG 12-Lead  . EKG 12-Lead    ASSESSMENT AND PLAN:  77 year old female with past medical history of rheumatoid arthritis, recent L1 fracture, right clavicle fracture managed nonoperatively and also has asthma, anxiety, restless leg syndrome comes from group home because of generalized weakness, poor p.o. intake and found to have severe hypokalemia, UTI. #1 MRSA bacteremia: Patient is on vancomycin, seen by ID Dr. Drucilla Schmidt , ID physician from [facility], recommended echocardiogram, further workup, CT chest is done that did not show any PE but showed possible bronchogenic carcinoma on the right side.  So patient will need further workup including pulmonary consult for tissue diagnosis, oncology consult, repeat blood cultures ordered to see resolution of bacteremia, continue vancomycin, patient needs antibiotics for 4 to weeks after repeat blood pressures have been negative for at least 48 hours  2.  Severe hypokalemia secondary to poor p.o. intake.  Pharmacy consulted for accurate management.  Hypokalemia improving. Continue IV hydration because patient  p.o. intake is still very poor  3.  Generalized weakness: Get physical therapy consult  4.  Hyperlipidemia: Continue atorvastatin. 5.  Asthma: No wheezing.  But does have cough.  Continue bronchodilators, Tussionex.,  Further workup for possible bronchogenic carcinoma including pulmonary consult, oncology consult  6.  History of rheumatoid arthritis: Continue Plaquenil. 7.  Restless leg syndrome: Continue Requip. Essential hypertension and now has tachycardia continue Toprol-XL. #8. pressure ulcer on the right gluteal region: Consult wound care tomorrow,  No wound care  service on weekend.  Start dressing  changes with calcium alginate, Santyl for today.  Consult surgery to see if she needs any debridement     All the records are reviewed and case discussed with Care Management/Social Workerr. Management plans discussed with the patient, family and they are in agreement.  CODE STATUS: Full code  TOTAL TIME TAKING CARE OF THIS PATIENT: 35 minutes.   POSSIBLE D/C IN 1-2 DAYS, DEPENDING ON CLINICAL CONDITION.   Epifanio Lesches M.D on 05/24/2017 at 7:32 AM  Between 7am to 6pm - Pager - [phone]  After 6pm go to [email or web address] - password [email or web address] [facility]  Office  [phone]  CC: Primary care physician; Remi Haggard, FNP   Note: This dictation was prepared with Dragon dictation along with smaller phrase technology. Any transcriptional errors that result from this process are unintentional.

## 2017-05-24 NOTE — Progress Notes (Signed)
ELECTROLYTE CONSULT NOTE - INITIAL   Pharmacy Consult for electrolyte monitoring/replacement Indication: hypokalemia  No Known Allergies  Patient Measurements: Height: 4\' 8"  (142.2 cm) Weight: 105 lb (47.6 kg) IBW/kg (Calculated) : 36.3  Vital Signs: Temp: 99.1 F (37.3 C) (02/17 0759) Temp Source: Oral (02/17 0759) BP: 157/81 (02/17 0759) Pulse Rate: 106 (02/17 0759) Intake/Output from previous day: 02/16 0701 - 02/17 0700 In: 477.5 [I.V.:377.5; IV Piggyback:100] Out: 750 [Urine:750] Intake/Output from this shift: No intake/output data recorded.  Labs: Recent Labs    05/22/17 1249 05/22/17 1329 05/23/17 0331 05/24/17 0348  WBC 12.0*  --  11.1*  --   HGB 12.0  --  11.0*  --   HCT 35.4  --  32.6*  --   PLT 337  --  350  --   CREATININE 0.47  --  0.35* 0.38*  MG  --  1.8 2.0  --    Estimated Creatinine Clearance: 38.5 mL/min (A) (by C-G formula based on SCr of 0.38 mg/dL (L)).  Assessment: Pharmacy consulted to assist with electrolyte monitoring/replacement in this 77 year old female admitted with UTI, MRSA bacteremia, and FTT.  Goal of Therapy: Electrolytes WNL  Plan: K = 4.6 this morning is WNL. Maintenance fluids with KCl have been discontinued per MD. No supplementation needed at this time. Will recheck electrolytes with AM labs tomorrow.  Lenis Noon, PharmD, BCPS Clinical Pharmacist 05/24/2017,8:03 AM

## 2017-05-24 NOTE — Consult Note (Signed)
Pulmonary Critical Care  Initial Consult Note  Linda Walsh XHB:[id number] DOB: 1940-08-09 DOA: 05/22/2017  Referring physician: Ezekiel Slocumb, MD  Chief Complaint: Lung Mass  HPI: Linda Walsh is a 77 y.o. female  With history of staph bactermia and recurring admssions followed by ID clinic. Presented to the hospital with weakness and UTI. She had been lethargic and also had hypokalemia. CT scan of the chest was done as part of her workup and a mass was noted on the scan concerning for primary neoplastic process. I was asked to see the patient for evaluation.  Review of Systems:  Patient somnolent at this time  Past Medical History:  Diagnosis Date  . Arthritis   . CHF (congestive heart failure) ([place])   . Depressed   . MRSA bacteremia 05/23/2017  . Renal disorder    History reviewed. No pertinent surgical history. Social History:  reports that  has never smoked. she has never used smokeless tobacco. She reports that she does not drink alcohol or use drugs.  No Known Allergies  Family History  Problem Relation Age of Onset  . Heart disease Father     Prior to Admission medications   Medication Sig Start Date End Date Taking? Authorizing Provider  acetaminophen (TYLENOL) 500 MG tablet Take 500 mg by mouth 2 (two) times daily. 10/24/09  Yes [provider]  aspirin EC 81 MG tablet Take 81 mg by mouth daily.   Yes [provider]  atorvastatin (LIPITOR) 20 MG tablet Take 20 mg by mouth daily. 10/27/13  Yes [provider]  budesonide (PULMICORT) 0.5 MG/2ML nebulizer solution Take 2 mLs by nebulization daily. 09/22/16  Yes [provider]  cetirizine (ZYRTEC) 10 MG tablet Take 10 mg by mouth daily.   Yes [provider]  Cholecalciferol (VITAMIN D-1000 MAX ST) 1000 units tablet Take 1,000 mg by mouth daily. 06/21/15  Yes [provider]  feeding supplement, ENSURE ENLIVE, (ENSURE ENLIVE) LIQD Take 237 mLs by mouth 2 (two) times daily  between meals. 05/15/17  Yes Mody, Ulice Bold, MD  ferrous sulfate 325 (65 FE) MG tablet Take 325 mg by mouth 2 (two) times daily.   Yes [provider]  Fluticasone-Salmeterol (ADVAIR) 250-50 MCG/DOSE AEPB Inhale 1 puff into the lungs 2 (two) times daily.   Yes [provider]  hydroxychloroquine (PLAQUENIL) 200 MG tablet Take 400 mg by mouth daily.  01/18/16  Yes [provider]  LORazepam (ATIVAN) 1 MG tablet Take 1 mg by mouth every evening.    Yes [provider]  metoprolol succinate (TOPROL-XL) 25 MG 24 hr tablet Take 25 mg by mouth daily.   Yes [provider]  mirtazapine (REMERON) 30 MG tablet Take 30 mg by mouth at bedtime.   Yes [provider]  mupirocin ointment (BACTROBAN) 2 % Place 1 application into the nose 2 (two) times daily. 05/15/17  Yes Mody, Ulice Bold, MD  pantoprazole (PROTONIX) 40 MG tablet Take 1 tablet by mouth daily. 05/12/17  Yes [provider]  PROAIR HFA 108 (90 Base) MCG/ACT inhaler Inhale 2 puffs into the lungs every 4 (four) hours as needed. 05/12/17  Yes [provider]  rOPINIRole (REQUIP) 0.5 MG tablet Take 0.5 mg by mouth at bedtime.   Yes [provider]  senna-docusate (SENOKOT-S) 8.6-50 MG tablet Take 1 tablet by mouth 2 (two) times daily.   Yes [provider]  Suvorexant (BELSOMRA) 15 MG TABS Take 15 mg by mouth at bedtime.    Yes  [provider]  traMADol (ULTRAM) 50 MG tablet Take 1 tablet (50 mg total) by mouth every 6 (six) hours as needed. 05/15/17 05/15/18 Yes Mody, Ulice Bold, MD  calcium-vitamin D (OSCAL WITH D) 500-200 MG-UNIT tablet Take 1 tablet by mouth 2 (two) times daily. Patient not taking: Reported on 05/22/2017 05/15/17   Bettey Costa, MD   Physical Exam: Vitals:   05/23/17 1618 05/23/17 2100 05/24/17 0357 05/24/17 0759  BP: (!) 146/82 (!) 150/72 (!) 152/75 (!) 157/81  Pulse: 96 (!) 101 (!) 107 (!) 106  Resp: 18 19 18  (!) 2  Temp: 99.2 F (37.3 C) 98.4 F (36.9  C) 98.8 F (37.1 C) 99.1 F (37.3 C)  TempSrc: Oral Oral Oral Oral  SpO2: 92% 93% 95% 96%  Weight:      Height:        Wt Readings from Last 3 Encounters:  05/22/17 105 lb (47.6 kg)  05/13/17 105 lb 9.6 oz (47.9 kg)  04/30/16 110 lb (49.9 kg)    General:  Appears comfortable Eyes: normal lids, irises & conjunctiva ENT: grossly normal lips & tongue Neck: no LAD, masses or thyromegaly Cardiovascular: RRR, no m/r/g. No LE edema. Respiratory: CTA bilaterally, no w/r/r.       Normal respiratory effort. Abdomen: soft, nontender Skin: no rash or induration seen on limited exam Musculoskeletal: grossly normal tone BUE/BLE Psychiatric: somnolent Neurologic: not able to perform.          Labs on Admission:  Basic Metabolic Panel: Recent Labs  Lab 05/22/17 1249 05/22/17 1329 05/23/17 0331 05/23/17 1505 05/24/17 0348  NA 135  --  135  --  133*  K 2.6*  --  2.6* 4.4 4.6  CL 94*  --  98*  --  104  CO2 29  --  21*  --  22  GLUCOSE 105*  --  92  --  70  BUN 23*  --  12  --  8  CREATININE 0.47  --  0.35*  --  0.38*  CALCIUM 8.3*  --  7.3*  --  7.5*  MG  --  1.8 2.0  --   --    Liver Function Tests: No results for input(s): AST, ALT, ALKPHOS, BILITOT, PROT, ALBUMIN in the last 168 hours. No results for input(s): LIPASE, AMYLASE in the last 168 hours. No results for input(s): AMMONIA in the last 168 hours. CBC: Recent Labs  Lab 05/22/17 1249 05/23/17 0331  WBC 12.0* 11.1*  HGB 12.0 11.0*  HCT 35.4 32.6*  MCV 89.2 91.1  PLT 337 350   Cardiac Enzymes: Recent Labs  Lab 05/22/17 1249  TROPONINI 0.03*    BNP (last 3 results) No results for input(s): BNP in the last 8760 hours.  ProBNP (last 3 results) No results for input(s): PROBNP in the last 8760 hours.  CBG: No results for input(s): GLUCAP in the last 168 hours.  Radiological Exams on Admission: Dg Chest 2 View  Result Date: 05/22/2017 CLINICAL DATA:  Fatigue, recent flu EXAM: CHEST  2 VIEW COMPARISON:   05/13/2017 FINDINGS: Negative for heart failure. Lungs are clear without infiltrate or effusion. Atherosclerotic aorta. Normal heart size. L1 compression fracture unchanged. IMPRESSION: No active cardiopulmonary disease. Electronically Signed   By: Franchot Gallo M.D.   On: 05/22/2017 13:28   Ct Angio Chest Pe W Or Wo Contrast  Result Date: 05/23/2017 CLINICAL DATA:  Evaluate for pulmonary embolus. EXAM: CT ANGIOGRAPHY CHEST WITH CONTRAST TECHNIQUE: Multidetector CT imaging of the chest  was performed using the standard protocol during bolus administration of intravenous contrast. Multiplanar CT image reconstructions and MIPs were obtained to evaluate the vascular anatomy. CONTRAST:  118mL ISOVUE-370 IOPAMIDOL (ISOVUE-370) INJECTION 76% COMPARISON:  None. FINDINGS: Cardiovascular: The heart size appears normal. No pericardial effusion. Aortic atherosclerosis noted. Calcification within the LAD coronary artery is noted. The main pulmonary artery appears patent. No saddle embolus or central obstructing emboli identified. No lobar or segmental pulmonary artery filling defects identified. Mediastinum/Nodes: Normal appearance of the thyroid gland. The trachea appears patent and is midline. Normal appearance of the esophagus. No enlarged mediastinal or hilar lymph nodes. Lungs/Pleura: No pleural effusion. Dependent changes and subsegmental atelectasis noted in the left base. Within the medial right upper lobe there is a necrotic appearing lung mass which extends into the posterior mediastinum posterior to the trachea and has mass effect upon the proximal esophagus. This measures 3.0 by 4.0 by 3.9 cm. Several satellite nodules are identified within the right upper lobe, the largest appears cavitary measuring 1.2 cm, image 17 of series 10. Upper Abdomen: No acute abnormality. Musculoskeletal: No suspicious bone lesions. No acute bone abnormality. Review of the MIP images confirms the above findings. IMPRESSION: 1.  Negative for acute pulmonary embolus. 2. Mass within the medial right upper lobe is identified invading the posterior mediastinum which is suspicious for primary bronchogenic carcinoma. Several additional satellite nodules are noted within the right upper lobe. Further evaluation with PET-CT and tissue sampling is advised. 3. Aortic atherosclerosis and coronary artery calcifications. Aortic Atherosclerosis (ICD10-I70.0). Electronically Signed   By: Kerby Moors M.D.   On: 05/23/2017 11:32    EKG: Independently reviewed.  Assessment/Plan Principal Problem:   MRSA bacteremia Active Problems:   UTI (urinary tract infection)   Pressure injury of skin   1. Lung Mass will likely need bronchoscopy but due to location may be better to do with EBUS. Will discuss with Dr Mortimer Fries.  Based on the location it is probably best to try to do as needle aspiration.  This would be best accomplished by endobronchial ultrasound.  The patient's son was in the room we had a lengthy discussion about the possibilities in the diagnosis that were entertaining.  They all seem to understand that this may entail the diagnosis of malignancy 2. Recurring MRSA Bacteremia treated with abx as you are.  Review x-rays periodically will closely monitor  Code Status: full code   Family Communication: none  Disposition Plan: home   Time spent: 51min  I have personally obtained a history, examined the patient, evaluated laboratory and imaging results, formulated the assessment and plan and placed orders.  The Patient requires high complexity decision making for assessment and support. Total Time Spent 29min   Linda Calico A Aleck Locklin, MD [facility] Pulmonary Critical Care Medicine Sleep Medicine

## 2017-05-24 NOTE — NC FL2 (Signed)
[place] LEVEL OF CARE SCREENING TOOL     IDENTIFICATION  Patient Name: Linda Walsh Birthdate: Mar 30, 1941 Sex: female Admission Date (Current Location): 05/22/2017  [place] and [place] Number:  Engineering geologist and Address:  [facility], [address] [place], [place] [address]      Provider Number: [id number]  Attending Physician Name and Address:  Epifanio Lesches, MD  Relative Name and Phone Number:  Evonne, Rinks   [phone]     Current Level of Care: Hospital Recommended Level of Care: [place] Prior Approval Number:    Date Approved/Denied: 05/24/17 PASRR Number: [id number]  Discharge Plan: SNF    Current Diagnoses: Patient Active Problem List   Diagnosis Date Noted  . Pressure injury of skin 05/23/2017  . MRSA bacteremia 05/23/2017  . UTI (urinary tract infection) 05/22/2017  . L1 vertebral fracture ([place]) 05/13/2017    Orientation RESPIRATION BLADDER Height & Weight     Self, Place, Situation  Normal Continent Weight: 105 lb (47.6 kg) Height:  4\' 8"  (142.2 cm)  BEHAVIORAL SYMPTOMS/MOOD NEUROLOGICAL BOWEL NUTRITION STATUS      Continent    AMBULATORY STATUS COMMUNICATION OF NEEDS Skin   Extensive Assist Verbally PU Stage and Appropriate Care                       Personal Care Assistance Level of Assistance  Bathing, Feeding, Dressing   Feeding assistance: Independent Dressing Assistance: Limited assistance     Functional Limitations Info    Sight Info: Adequate Hearing Info: Impaired Speech Info: Adequate    SPECIAL CARE FACTORS FREQUENCY  PT (By licensed PT)     PT Frequency: Up to 5X per week              Contractures Contractures Info: Not present    Additional Factors Info  Code Status, Allergies Code Status Info: Full Allergies Info: No Known Allergies     Isolation Precautions Info: MRSA     Current Medications (05/24/2017):  This is the  current hospital active medication list Current Facility-Administered Medications  Medication Dose Route Frequency Provider Last Rate Last Dose  . acetaminophen (TYLENOL) tablet 650 mg  650 mg Oral Q6H PRN Henreitta Leber, MD       Or  . acetaminophen (TYLENOL) suppository 650 mg  650 mg Rectal Q6H PRN Sainani, Belia Heman, MD      . albuterol (PROVENTIL) (2.5 MG/3ML) 0.083% nebulizer solution 3 mL  3 mL Inhalation Q4H PRN Henreitta Leber, MD      . aspirin EC tablet 81 mg  81 mg Oral Daily Henreitta Leber, MD   81 mg at 05/24/17 1057  . atorvastatin (LIPITOR) tablet 20 mg  20 mg Oral Daily Henreitta Leber, MD   20 mg at 05/23/17 1736  . budesonide (PULMICORT) nebulizer solution 0.5 mg  2 mL Nebulization Daily Henreitta Leber, MD   0.5 mg at 05/24/17 0851  . cefTRIAXone (ROCEPHIN) 1 g in sodium chloride 0.9 % 100 mL IVPB  1 g Intravenous Q24H Henreitta Leber, MD 200 mL/hr at 05/24/17 1056 1 g at 05/24/17 1056  . cholecalciferol (VITAMIN D) tablet 1,000 Units  1,000 Units Oral Daily Henreitta Leber, MD   1,000 Units at 05/24/17 1057  . collagenase (SANTYL) ointment   Topical Daily Epifanio Lesches, MD      . dextrose 5 %-0.45 % sodium chloride infusion   Intravenous Continuous  Epifanio Lesches, MD 75 mL/hr at 05/24/17 1138    . enoxaparin (LOVENOX) injection 40 mg  40 mg Subcutaneous Q24H Henreitta Leber, MD   40 mg at 05/23/17 2252  . feeding supplement (ENSURE ENLIVE) (ENSURE ENLIVE) liquid 237 mL  237 mL Oral BID BM Henreitta Leber, MD   237 mL at 05/23/17 1447  . ferrous sulfate tablet 325 mg  325 mg Oral BID WC Henreitta Leber, MD   325 mg at 05/24/17 1056  . guaiFENesin-dextromethorphan (ROBITUSSIN DM) 100-10 MG/5ML syrup 5 mL  5 mL Oral Q4H PRN Henreitta Leber, MD   5 mL at 05/22/17 1748  . hydroxychloroquine (PLAQUENIL) tablet 400 mg  400 mg Oral Daily Henreitta Leber, MD   400 mg at 05/24/17 1057  . loratadine (CLARITIN) tablet 10 mg  10 mg Oral Daily Henreitta Leber, MD    10 mg at 05/24/17 1057  . LORazepam (ATIVAN) tablet 1 mg  1 mg Oral QPM Henreitta Leber, MD   1 mg at 05/23/17 1736  . Melatonin TABS 5 mg  5 mg Oral QHS Henreitta Leber, MD   5 mg at 05/23/17 2252  . metaxalone (SKELAXIN) tablet 800 mg  800 mg Oral TID Epifanio Lesches, MD   800 mg at 05/24/17 1057  . metoprolol succinate (TOPROL-XL) 24 hr tablet 50 mg  50 mg Oral Daily Epifanio Lesches, MD   50 mg at 05/24/17 1057  . mirtazapine (REMERON) tablet 30 mg  30 mg Oral QHS Henreitta Leber, MD   30 mg at 05/23/17 2252  . mometasone-formoterol (DULERA) 200-5 MCG/ACT inhaler 2 puff  2 puff Inhalation BID Henreitta Leber, MD   2 puff at 05/24/17 1056  . ondansetron (ZOFRAN) tablet 4 mg  4 mg Oral Q6H PRN Henreitta Leber, MD       Or  . ondansetron (ZOFRAN) injection 4 mg  4 mg Intravenous Q6H PRN Henreitta Leber, MD      . pantoprazole (PROTONIX) EC tablet 40 mg  40 mg Oral Daily Henreitta Leber, MD   40 mg at 05/24/17 1057  . rOPINIRole (REQUIP) tablet 0.5 mg  0.5 mg Oral QHS Henreitta Leber, MD   0.5 mg at 05/23/17 2252  . senna-docusate (Senokot-S) tablet 1 tablet  1 tablet Oral BID Henreitta Leber, MD   1 tablet at 05/24/17 1056  . traMADol (ULTRAM) tablet 50 mg  50 mg Oral Q6H PRN Henreitta Leber, MD   50 mg at 05/24/17 1138  . [START ON 05/25/2017] vancomycin (VANCOCIN) IVPB 750 mg/150 ml premix  750 mg Intravenous Q24H Lenis Noon, [facility]         Discharge Medications: Please see discharge summary for a list of discharge medications.  Relevant Imaging Results:  Relevant Lab Results:   Additional Information SS# [id number]  Zettie Pho, LCSW

## 2017-05-25 LAB — BASIC METABOLIC PANEL
ANION GAP: 8 (ref 5–15)
BUN: <5 mg/dL — ABNORMAL LOW (ref 6–20)
CO2: 26 mmol/L (ref 22–32)
Calcium: 7.8 mg/dL — ABNORMAL LOW (ref 8.9–10.3)
Chloride: 93 mmol/L — ABNORMAL LOW (ref 101–111)
Creatinine, Ser: 0.31 mg/dL — ABNORMAL LOW (ref 0.44–1.00)
GLUCOSE: 117 mg/dL — AB (ref 65–99)
POTASSIUM: 3.5 mmol/L (ref 3.5–5.1)
Sodium: 127 mmol/L — ABNORMAL LOW (ref 135–145)

## 2017-05-25 LAB — CULTURE, BLOOD (ROUTINE X 2): Special Requests: ADEQUATE

## 2017-05-25 LAB — URINE CULTURE
Culture: >=100000 — AB
Special Requests: NORMAL

## 2017-05-25 LAB — PHOSPHORUS: PHOSPHORUS: 2.9 mg/dL (ref 2.5–4.6)

## 2017-05-25 LAB — MAGNESIUM: Magnesium: 1.5 mg/dL — ABNORMAL LOW (ref 1.7–2.4)

## 2017-05-25 MED ORDER — MAGNESIUM SULFATE 2 GM/50ML IV SOLN
2.0000 g | Freq: Once | INTRAVENOUS | Status: AC
  Administered 2017-05-25: 2 g via INTRAVENOUS
  Filled 2017-05-25: qty 50

## 2017-05-25 MED ORDER — CEPHALEXIN 500 MG PO CAPS
500.0000 mg | ORAL_CAPSULE | Freq: Two times a day (BID) | ORAL | Status: AC
  Administered 2017-05-25 – 2017-05-27 (×6): 500 mg via ORAL
  Filled 2017-05-25 (×6): qty 1

## 2017-05-25 NOTE — Clinical Social Work Placement (Signed)
   CLINICAL SOCIAL WORK PLACEMENT  NOTE  Date:  05/25/2017  Patient Details  Name: Linda Walsh MRN: [id number] Date of Birth: 1940/05/15  Clinical Social Work is seeking post-discharge placement for this patient at the [place] level of care (*CSW will initial, date and re-position this form in  chart as items are completed):  Yes   Patient/family provided with [place] Work Department's list of facilities offering this level of care within the geographic area requested by the patient (or if unable, by the patient's family).  Yes   Patient/family informed of their freedom to choose among providers that offer the needed level of care, that participate in Medicare, Medicaid or managed care program needed by the patient, have an available bed and are willing to accept the patient.  Yes   Patient/family informed of [facility]'s ownership interest in [facility] and [facility], as well as of the fact that they are under no obligation to receive care at these facilities.  PASRR submitted to EDS on 05/24/17     PASRR number received on 05/24/17     Existing PASRR number confirmed on       FL2 transmitted to all facilities in geographic area requested by pt/family on 05/24/17     FL2 transmitted to all facilities within larger geographic area on       Patient informed that his/her managed care company has contracts with or will negotiate with certain facilities, including the following:        Yes   Patient/family informed of bed offers received.  Patient chooses bed at ([facility] )     Physician recommends and patient chooses bed at      Patient to be transferred to   on  .  Patient to be transferred to facility by       Patient family notified on   of transfer.  Name of family member notified:        PHYSICIAN       Additional Comment:    _______________________________________________ Carolynne Schuchard, Veronia Beets, LCSW 05/25/2017, 9:18 AM

## 2017-05-25 NOTE — Care Management Important Message (Signed)
Important Message  Patient Details  Name: Linda Walsh MRN: [id number] Date of Birth: 1941-01-07   Medicare Important Message Given:  Yes    Marshell Garfinkel, RN 05/25/2017, 7:36 AM

## 2017-05-25 NOTE — Consult Note (Signed)
[facility] Infectious Disease     Reason for Consult:  MRSA bacteremia Referring Physician:  Governor Specking Date of Admission:  05/22/2017   Principal Problem:   MRSA bacteremia Active Problems:   UTI (urinary tract infection)   Pressure injury of skin   HPI: Linda Walsh is a 77 y.o. female admitted from group home with weakness, AMS.  She had recent admission where she was dx with a fall and L1 fx as well as flu. 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 On this admission wbc was 12 and having low grade fevers.  Started on CTX but then vanco added 2/17 when BCx +. Repeat BCX pending. CT shows possible RUL mass vs ? Abscess.  CXR on initial admission and this admission did not show this.    Past Medical History:  Diagnosis Date  . Arthritis   . CHF (congestive heart failure) ([place])   . Depressed   . MRSA bacteremia 05/23/2017  . Renal disorder    History reviewed. No pertinent surgical history. Social History   Tobacco Use  . Smoking status: Never Smoker  . Smokeless tobacco: Never Used  Substance Use Topics  . Alcohol use: No  . Drug use: No   Family History  Problem Relation Age of Onset  . Heart disease Father     Allergies: No Known Allergies  Current antibiotics: Antibiotics Given (last 72 hours)    Date/Time Action Medication Dose Rate   05/22/17 1440 New Bag/Given   cefTRIAXone (ROCEPHIN) 1 g in sodium chloride 0.9 % 100 mL IVPB 1 g 200 mL/hr   05/23/17 1016 Given   hydroxychloroquine (PLAQUENIL) tablet 400 mg 400 mg    05/23/17 1520 New Bag/Given  [med not available]   cefTRIAXone (ROCEPHIN) 1 g in sodium chloride 0.9 % 100 mL IVPB 1 g 200 mL/hr   05/24/17 0829 New Bag/Given   vancomycin (VANCOCIN) IVPB 1000 mg/200 mL premix 1,000 mg 200 mL/hr   05/24/17 1056 New Bag/Given   cefTRIAXone (ROCEPHIN) 1 g in sodium chloride 0.9 % 100 mL IVPB 1 g 200 mL/hr   05/24/17 1057 Given   hydroxychloroquine (PLAQUENIL) tablet 400 mg 400 mg    05/25/17 1031 New Bag/Given    cefTRIAXone (ROCEPHIN) 1 g in sodium chloride 0.9 % 100 mL IVPB 1 g 200 mL/hr   05/25/17 1033 Given   hydroxychloroquine (PLAQUENIL) tablet 400 mg 400 mg    05/25/17 1054 New Bag/Given   vancomycin (VANCOCIN) IVPB 750 mg/150 ml premix 750 mg 150 mL/hr      MEDICATIONS: . aspirin EC  81 mg Oral Daily  . atorvastatin  20 mg Oral Daily  . budesonide  2 mL Nebulization Daily  . cholecalciferol  1,000 Units Oral Daily  . collagenase   Topical Daily  . enoxaparin (LOVENOX) injection  40 mg Subcutaneous Q24H  . feeding supplement (ENSURE ENLIVE)  237 mL Oral BID BM  . ferrous sulfate  325 mg Oral BID WC  . hydroxychloroquine  400 mg Oral Daily  . loratadine  10 mg Oral Daily  . LORazepam  1 mg Oral QPM  . Melatonin  5 mg Oral QHS  . metaxalone  800 mg Oral TID  . metoprolol succinate  50 mg Oral Daily  . mirtazapine  30 mg Oral QHS  . mometasone-formoterol  2 puff Inhalation BID  . multivitamin with minerals  1 tablet Oral Daily  . pantoprazole  40 mg Oral Daily  . rOPINIRole  0.5 mg Oral  QHS  . senna-docusate  1 tablet Oral BID    Review of Systems - 11 systems reviewed and negative per HPI   OBJECTIVE: Temp:  [98 F (36.7 C)-99.3 F (37.4 C)] 99.3 F (37.4 C) (02/18 0805) Pulse Rate:  [99-108] 105 (02/18 0805) Resp:  [16-18] 18 (02/18 0805) BP: (153-170)/(72-92) 157/85 (02/18 0805) SpO2:  [95 %-97 %] 97 % (02/18 0820) Physical Exam  Constitutional:  Linda Walsh, oriented to person, place, and time. Thin, frail, lying on l side in bed HENT: [place]/AT, PERRLA, no scleral icterus Mouth/Throat: Oropharynx is clear and dry . No oropharyngeal exudate.  Cardiovascular: Normal rate, regular rhythm and normal heart sounds. 2/6 sm Pulmonary/Chest: poor air movement, rhonchi throughoug Neck = supple, no nuchal rigidity Abdominal: Soft. Bowel sounds are normal.  exhibits no distension. There is no tenderness.  Lymphadenopathy: no cervical adenopathy. No axillary adenopathy Neurological: alert  and oriented to person, place, and time.  Skin: Skin is warm and dry. R buttock with 2x3 cm irregular shallow ulceration with slough, mild surrounding erythema Psychiatric: very flat affect Ext no cce, has RA changes of hands ttp of l spine  LABS: Results for orders placed or performed during the hospital encounter of 05/22/17 (from the past 48 hour(s))  Potassium     Status: None   Collection Time: 05/23/17  3:05 PM  Result Value Ref Range   Potassium 4.4 3.5 - 5.1 mmol/L    Comment: Performed at [facility], [address], [place], [place] [address]  Basic metabolic panel     Status: Abnormal   Collection Time: 05/24/17  3:48 AM  Result Value Ref Range   Sodium 133 (L) 135 - 145 mmol/L   Potassium 4.6 3.5 - 5.1 mmol/L   Chloride 104 101 - 111 mmol/L   CO2 22 22 - 32 mmol/L   Glucose, Bld 70 65 - 99 mg/dL   BUN 8 6 - 20 mg/dL   Creatinine, Ser 0.38 (L) 0.44 - 1.00 mg/dL   Calcium 7.5 (L) 8.9 - 10.3 mg/dL   GFR calc non Af Amer >60 >60 mL/min   GFR calc Af Amer >60 >60 mL/min    Comment: (NOTE) The eGFR has been calculated using the CKD EPI equation. This calculation has not been validated in all clinical situations. eGFR's persistently <60 mL/min signify possible Chronic Kidney Disease.    Anion gap 7 5 - 15    Comment: Performed at [facility], [address] [place], [place] [address]  Culture, blood (Routine X 2) w Reflex to ID Panel     Status: None (Preliminary result)   Collection Time: 05/24/17  9:48 AM  Result Value Ref Range   Specimen Description BLOOD LEFT ANTECUBITAL    Special Requests      BOTTLES DRAWN AEROBIC AND ANAEROBIC Blood Culture adequate volume   Culture      NO GROWTH 1 DAY Performed at [facility], [address] [place],  [address]    Report Status PENDING   Culture, blood (Routine X 2) w Reflex to ID Panel     Status: None (Preliminary result)   Collection Time: 05/24/17 10:04 AM  Result Value Ref  Range   Specimen Description BLOOD BLOOD LEFT HAND    Special Requests      BOTTLES DRAWN AEROBIC AND ANAEROBIC Blood Culture adequate volume   Culture      NO GROWTH 1 DAY Performed at [facility], [address] [place],  [place] [address]    Report Status PENDING   Basic metabolic panel     Status: Abnormal   Collection Time: 05/25/17  2:42 AM  Result Value Ref Range   Sodium 127 (L) 135 - 145 mmol/L   Potassium 3.5 3.5 - 5.1 mmol/L   Chloride 93 (L) 101 - 111 mmol/L   CO2 26 22 - 32 mmol/L   Glucose, Bld 117 (H) 65 - 99 mg/dL   BUN <5 (L) 6 - 20 mg/dL   Creatinine, Ser 0.31 (L) 0.44 - 1.00 mg/dL   Calcium 7.8 (L) 8.9 - 10.3 mg/dL   GFR calc non Af Amer >60 >60 mL/min   GFR calc Af Amer >60 >60 mL/min    Comment: (NOTE) The eGFR has been calculated using the CKD EPI equation. This calculation has not been validated in all clinical situations. eGFR's persistently <60 mL/min signify possible Chronic Kidney Disease.    Anion gap 8 5 - 15    Comment: Performed at [facility], [address] [place], [place] [address]  Magnesium     Status: Abnormal   Collection Time: 05/25/17  2:42 AM  Result Value Ref Range   Magnesium 1.5 (L) 1.7 - 2.4 mg/dL    Comment: Performed at [facility], [address] [place], [place] [address]  Phosphorus     Status: None   Collection Time: 05/25/17  2:42 AM  Result Value Ref Range   Phosphorus 2.9 2.5 - 4.6 mg/dL    Comment: Performed at [facility], [address] [place], [place] [address]   No components found for: ESR, C REACTIVE PROTEIN MICRO: Recent Results (from the past 720 hour(s))  MRSA PCR Screening     Status: Abnormal   Collection Time: 05/13/17  4:21 PM  Result Value Ref Range Status   MRSA by PCR POSITIVE (A) NEGATIVE Final    Comment:        The GeneXpert MRSA Assay (FDA approved for NASAL specimens only), is one component of a comprehensive MRSA colonization surveillance  program. It is not intended to diagnose MRSA infection nor to guide or monitor treatment for MRSA infections. RESULT CALLED TO, READ BACK BY AND VERIFIED WITH: THERESA COBLE AT 1937 05/13/2017 BY TFK. Performed at [facility], [address] [place], [place] [address]   Urine Culture     Status: Abnormal   Collection Time: 05/22/17 12:19 PM  Result Value Ref Range Status   Specimen Description   Final    URINE, CLEAN CATCH Performed at [facility], [address] [place], [place] [address]    Special Requests   Final    Normal Performed at [facility], [place], [place] [address]    Culture >=100,000 COLONIES/mL KLEBSIELLA PNEUMONIAE (A)  Final   Report Status 05/25/2017 FINAL  Final   Organism ID, Bacteria KLEBSIELLA PNEUMONIAE (A)  Final      Susceptibility   Klebsiella pneumoniae - MIC*    AMPICILLIN >=32 RESISTANT Resistant     CEFAZOLIN <=4 SENSITIVE Sensitive     CEFTRIAXONE <=1 SENSITIVE Sensitive     CIPROFLOXACIN <=0.25 SENSITIVE Sensitive     GENTAMICIN <=1 SENSITIVE Sensitive     IMIPENEM <=0.25 SENSITIVE Sensitive     NITROFURANTOIN 64 INTERMEDIATE Intermediate     TRIMETH/SULFA <=20 SENSITIVE Sensitive     AMPICILLIN/SULBACTAM 4 SENSITIVE Sensitive     PIP/TAZO <=4 SENSITIVE Sensitive     Extended ESBL NEGATIVE  Sensitive     * >=100,000 COLONIES/mL KLEBSIELLA PNEUMONIAE  Blood Culture (routine x 2)     Status: Abnormal   Collection Time: 05/22/17  2:13 PM  Result Value Ref Range Status   Specimen Description   Final    BLOOD BLOOD RIGHT HAND Performed at [facility], [address], [place], [place] [address]    Special Requests   Final    BOTTLES DRAWN AEROBIC AND ANAEROBIC Blood Culture results may not be optimal due to an inadequate volume of blood received in culture bottles Performed at [facility], [address] [place], [place] [address]    Culture  Setup Time   Final    GRAM  POSITIVE COCCI IN BOTH AEROBIC AND ANAEROBIC BOTTLES CRITICAL RESULT CALLED TO, READ BACK BY AND VERIFIED WITH: Lamees Gable BESANTI AT 2449 05/23/17.PMH Performed at [facility], [place] [address], [place], [place] [address]    Culture METHICILLIN RESISTANT STAPHYLOCOCCUS AUREUS (A)  Final   Report Status 05/25/2017 FINAL  Final   Organism ID, Bacteria METHICILLIN RESISTANT STAPHYLOCOCCUS AUREUS  Final      Susceptibility   Methicillin resistant staphylococcus aureus - MIC*    CIPROFLOXACIN <=0.5 SENSITIVE Sensitive     ERYTHROMYCIN >=8 RESISTANT Resistant     GENTAMICIN <=0.5 SENSITIVE Sensitive     OXACILLIN >=4 RESISTANT Resistant     TETRACYCLINE <=1 SENSITIVE Sensitive     VANCOMYCIN 1 SENSITIVE Sensitive     TRIMETH/SULFA <=10 SENSITIVE Sensitive     CLINDAMYCIN <=0.25 SENSITIVE Sensitive     RIFAMPIN <=0.5 SENSITIVE Sensitive     Inducible Clindamycin NEGATIVE Sensitive     * METHICILLIN RESISTANT STAPHYLOCOCCUS AUREUS  Blood Culture (routine x 2)     Status: Abnormal   Collection Time: 05/22/17  2:13 PM  Result Value Ref Range Status   Specimen Description   Final    BLOOD BLOOD RIGHT FOREARM Performed at [facility], [address] [place], [place] [address]    Special Requests   Final    BOTTLES DRAWN AEROBIC AND ANAEROBIC Blood Culture adequate volume Performed at [facility], [address] [place], [place] [address]    Culture  Setup Time   Final    GRAM POSITIVE COCCI IN BOTH AEROBIC AND ANAEROBIC BOTTLES CRITICAL RESULT CALLED TO, READ BACK BY AND VERIFIED WITH: Colbe Viviano BESANTI AT 5670 05/23/17.PMH Performed at [facility], [address] [place], [place] [address]    Culture (A)  Final    STAPHYLOCOCCUS AUREUS SUSCEPTIBILITIES PERFORMED ON PREVIOUS CULTURE WITHIN THE LAST 5 DAYS. Performed at [facility], [place] [address], [place],  [address]    Report Status 05/25/2017 FINAL  Final  Blood Culture ID Panel  (Reflexed)     Status: Abnormal   Collection Time: 05/22/17  2:13 PM  Result Value Ref Range Status   Enterococcus species NOT DETECTED NOT DETECTED Final   Listeria monocytogenes NOT DETECTED NOT DETECTED Final   Staphylococcus species DETECTED (A) NOT DETECTED Final    Comment: CRITICAL RESULT CALLED TO, READ BACK BY AND VERIFIED WITH: Sheletha Bow BESANTI AT 3888 05/23/17.PMH    Staphylococcus aureus DETECTED (A) NOT DETECTED Final    Comment: Methicillin (oxacillin)-resistant Staphylococcus aureus (MRSA). MRSA is predictably resistant to beta-lactam antibiotics (except ceftaroline). Preferred therapy is vancomycin unless clinically contraindicated. Patient requires contact precautions if  hospitalized. CRITICAL RESULT CALLED TO, READ BACK BY AND VERIFIED WITH: Kimberla Driskill BESANTI AT 7579 05/23/17.PMH  Methicillin resistance DETECTED (A) NOT DETECTED Final    Comment: CRITICAL RESULT CALLED TO, READ BACK BY AND VERIFIED WITH: Jair Lindblad BESANTI AT 8250 05/23/17.PMH    Streptococcus species NOT DETECTED NOT DETECTED Final   Streptococcus agalactiae NOT DETECTED NOT DETECTED Final   Streptococcus pneumoniae NOT DETECTED NOT DETECTED Final   Streptococcus pyogenes NOT DETECTED NOT DETECTED Final   Acinetobacter baumannii NOT DETECTED NOT DETECTED Final   Enterobacteriaceae species NOT DETECTED NOT DETECTED Final   Enterobacter cloacae complex NOT DETECTED NOT DETECTED Final   Escherichia coli NOT DETECTED NOT DETECTED Final   Klebsiella oxytoca NOT DETECTED NOT DETECTED Final   Klebsiella pneumoniae NOT DETECTED NOT DETECTED Final   Proteus species NOT DETECTED NOT DETECTED Final   Serratia marcescens NOT DETECTED NOT DETECTED Final   Haemophilus influenzae NOT DETECTED NOT DETECTED Final   Neisseria meningitidis NOT DETECTED NOT DETECTED Final   Pseudomonas aeruginosa NOT DETECTED NOT DETECTED Final   Candida albicans NOT DETECTED NOT DETECTED Final   Candida glabrata NOT DETECTED NOT DETECTED Final    Candida krusei NOT DETECTED NOT DETECTED Final   Candida parapsilosis NOT DETECTED NOT DETECTED Final   Candida tropicalis NOT DETECTED NOT DETECTED Final    Comment: Performed at [facility], [address] [place], [place] [address]  Culture, blood (Routine X 2) w Reflex to ID Panel     Status: None (Preliminary result)   Collection Time: 05/24/17  9:48 AM  Result Value Ref Range Status   Specimen Description BLOOD LEFT ANTECUBITAL  Final   Special Requests   Final    BOTTLES DRAWN AEROBIC AND ANAEROBIC Blood Culture adequate volume   Culture   Final    NO GROWTH 1 DAY Performed at [facility], [address], [place], [place] [address]    Report Status PENDING  Incomplete  Culture, blood (Routine X 2) w Reflex to ID Panel     Status: None (Preliminary result)   Collection Time: 05/24/17 10:04 AM  Result Value Ref Range Status   Specimen Description BLOOD BLOOD LEFT HAND  Final   Special Requests   Final    BOTTLES DRAWN AEROBIC AND ANAEROBIC Blood Culture adequate volume   Culture   Final    NO GROWTH 1 DAY Performed at [facility], [address], [place], [place] [address]    Report Status PENDING  Incomplete    IMAGING: Dg Chest 2 View  Result Date: 05/22/2017 CLINICAL DATA:  Fatigue, recent flu EXAM: CHEST  2 VIEW COMPARISON:  05/13/2017 FINDINGS: Negative for heart failure. Lungs are clear without infiltrate or effusion. Atherosclerotic aorta. Normal heart size. L1 compression fracture unchanged. IMPRESSION: No active cardiopulmonary disease. Electronically Signed   By: Franchot Gallo M.D.   On: 05/22/2017 13:28   Dg Chest 2 View  Result Date: 05/13/2017 CLINICAL DATA:  Cough, recent fall EXAM: CHEST  2 VIEW COMPARISON:  Chest x-ray of 05/12/2017 FINDINGS: The lungs remain somewhat hyperaerated but clear. No pneumonia or effusion is seen. Mediastinal hilar contours are unremarkable and the heart is within normal limits in size. The bones  are osteopenic. There are diffuse degenerative changes in the shoulders. The right distal clavicle fracture with separation of the right AC joint is not as well seen by chest x-ray. IMPRESSION: 1. No active lung disease.  Slight hyper aeration. 2. The fracture of the distal right clavicle with dislocation of the right AC joint noted on right shoulder films is not as  well seen by chest x-ray. 3. Degenerative changes of the shoulders. Electronically Signed   By: Ivar Drape M.D.   On: 05/13/2017 10:39   Dg Chest 2 View  Result Date: 05/13/2017 CLINICAL DATA:  77 year old female history of cough for 1 month. EXAM: CHEST  2 VIEW COMPARISON:  Chest x-ray 04/30/2016. FINDINGS: Lung volumes are normal. No consolidative airspace disease. No pleural effusions. No pneumothorax. No pulmonary nodule or mass noted. Pulmonary vasculature and the cardiomediastinal silhouette are within normal limits. Atherosclerosis in the thoracic aorta. IMPRESSION: 1.  No radiographic evidence of acute cardiopulmonary disease. 2. Aortic atherosclerosis. Electronically Signed   By: Vinnie Langton M.D.   On: 05/13/2017 08:47   Dg Lumbar Spine Complete  Result Date: 05/13/2017 CLINICAL DATA:  Cough, fell yesterday with some low back pain EXAM: LUMBAR SPINE - COMPLETE 4+ VIEW COMPARISON:  None. FINDINGS: The lumbar vertebrae are slightly straightened in alignment. There is what appears to be and acute compression deformity of L1 vertebral body of approximately 40%. There may be slight retropulsion at the level of the compression deformity. The bones are osteopenic. There is degenerative disc disease at L4-5 with some loss of disc space. The SI joints are corticated. IMPRESSION: 1. Probable acute compression deformity of L1 vertebral body of approximately 40% with some retropulsion. 2. Diffuse osteopenia. 3. 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 Electronically Signed   By: Ivar Drape M.D.   On: 05/13/2017 10:26   Dg Pelvis 1-2  Views  Result Date: 05/13/2017 CLINICAL DATA:  [place] [address] yesterday now with low back pain EXAM: PELVIS - 1-2 VIEW COMPARISON:  None FINDINGS: There is mild degenerative joint disease with some loss of joint space bilaterally. No hip fracture is seen. There appears to be and old left inferior pubic ramus fracture but clinical correlation is recommended. Also there is a faint lucency through the right pubic ramus most likely overlapping, but a subtle fracture cannot be excluded. The pubic ramus is in normal position. The SI joints are corticated. IMPRESSION: 1. Probable old fracture of the left inferior pelvic ramus. Correlate clinically. 2. Vague lucency through the right pubic ramus may be due to overlapping structures, but correlate clinically or consider CT of the pelvis to assess these areas further. Electronically Signed   By: Ivar Drape M.D.   On: 05/13/2017 12:10   Dg Shoulder Right  Result Date: 05/13/2017 CLINICAL DATA:  Recent fall, back pain EXAM: RIGHT SHOULDER - 2+ VIEW COMPARISON:  Chest x-ray of 05/13/2017 and 05/12/2017 FINDINGS: Fracture separation of the right [facility] joint is noted. There is a significant degenerative joint disease of the right glenohumeral joint with loss of joint space and spurring. Cortical discontinuity of the posterior right seventh rib is noted which could represent fracture or old deformity. IMPRESSION: 1. Fracture/separation of the right [facility] joint which may be acute. Correlate clinically. 2. 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 Electronically Signed   By: Ivar Drape M.D.   On: 05/13/2017 10:29   Ct Head Wo Contrast  Result Date: 05/13/2017 CLINICAL DATA:  Fall, posterior skull injury, hematoma.  Neck pain. EXAM: CT HEAD WITHOUT CONTRAST CT CERVICAL SPINE WITHOUT CONTRAST TECHNIQUE: Multidetector CT imaging of the head and cervical spine was performed following the standard protocol without intravenous contrast. Multiplanar CT image  reconstructions of the cervical spine were also generated. COMPARISON:  None. FINDINGS: CT HEAD FINDINGS Brain: No evidence of acute infarction, hemorrhage, hydrocephalus, extra-axial collection or mass lesion/mass effect. Mild cortical  atrophy. Subcortical white matter and periventricular small vessel ischemic changes. Vascular: Intracranial atherosclerosis. Skull: Normal. Negative for fracture or focal lesion. Sinuses/Orbits: Partial opacification of the bilateral ethmoid and sphenoid sinuses. Opacification of the right mastoid air cells. Other: Mild soft tissue swelling/hematoma overlying the right posterior vertex. CT CERVICAL SPINE FINDINGS Alignment: Normal cervical lordosis. Skull base and vertebrae: No acute fracture. No primary bone lesion or focal pathologic process. Soft tissues and spinal canal: No prevertebral fluid or swelling. No visible canal hematoma. Disc levels:  Mild degenerative changes of the mid cervical spine. Spinal canal is patent. Upper chest: Visualized lung apices are notable for biapical pleural-parenchymal scarring. Other: Visualized thyroid is unremarkable. IMPRESSION: Mild soft tissue swelling/hematoma overlying the right posterior vertex. No evidence of calvarial fracture. No evidence of acute intracranial abnormality. Atrophy with small vessel ischemic changes. Opacification of the right mastoid air cells. No evidence of traumatic injury to the cervical spine. Electronically Signed   By: Julian Hy M.D.   On: 05/13/2017 11:28   Ct Angio Chest Pe W Or Wo Contrast  Result Date: 05/23/2017 CLINICAL DATA:  Evaluate for pulmonary embolus. EXAM: CT ANGIOGRAPHY CHEST WITH CONTRAST TECHNIQUE: Multidetector CT imaging of the chest was performed using the standard protocol during bolus administration of intravenous contrast. Multiplanar CT image reconstructions and MIPs were obtained to evaluate the vascular anatomy. CONTRAST:  141m ISOVUE-370 IOPAMIDOL (ISOVUE-370) INJECTION 76%  COMPARISON:  None. FINDINGS: Cardiovascular: The heart size appears normal. No pericardial effusion. Aortic atherosclerosis noted. Calcification within the LAD coronary artery is noted. The main pulmonary artery appears patent. No saddle embolus or central obstructing emboli identified. No lobar or segmental pulmonary artery filling defects identified. Mediastinum/Nodes: Normal appearance of the thyroid gland. The trachea appears patent and is midline. Normal appearance of the esophagus. No enlarged mediastinal or hilar lymph nodes. Lungs/Pleura: No pleural effusion. Dependent changes and subsegmental atelectasis noted in the left base. Within the medial right upper lobe there is a necrotic appearing lung mass which extends into the posterior mediastinum posterior to the trachea and has mass effect upon the proximal esophagus. This measures 3.0 by 4.0 by 3.9 cm. Several satellite nodules are identified within the right upper lobe, the largest appears cavitary measuring 1.2 cm, image 17 of series 10. Upper Abdomen: No acute abnormality. Musculoskeletal: No suspicious bone lesions. No acute bone abnormality. Review of the MIP images confirms the above findings. IMPRESSION: 1. Negative for acute pulmonary embolus. 2. Mass within the medial right upper lobe is identified invading the posterior mediastinum which is suspicious for primary bronchogenic carcinoma. Several additional satellite nodules are noted within the right upper lobe. Further evaluation with PET-CT and tissue sampling is advised. 3. Aortic atherosclerosis and coronary artery calcifications. Aortic Atherosclerosis (ICD10-I70.0). Electronically Signed   By: TKerby MoorsM.D.   On: 05/23/2017 11:32   Ct Cervical Spine Wo Contrast  Result Date: 05/13/2017 CLINICAL DATA:  Fall, posterior skull injury, hematoma.  Neck pain. EXAM: CT HEAD WITHOUT CONTRAST CT CERVICAL SPINE WITHOUT CONTRAST TECHNIQUE: Multidetector CT imaging of the head and cervical spine  was performed following the standard protocol without intravenous contrast. Multiplanar CT image reconstructions of the cervical spine were also generated. COMPARISON:  None. FINDINGS: CT HEAD FINDINGS Brain: No evidence of acute infarction, hemorrhage, hydrocephalus, extra-axial collection or mass lesion/mass effect. Mild cortical atrophy. Subcortical white matter and periventricular small vessel ischemic changes. Vascular: Intracranial atherosclerosis. Skull: Normal. Negative for fracture or focal lesion. Sinuses/Orbits: Partial opacification of the bilateral ethmoid and sphenoid  sinuses. Opacification of the right mastoid air cells. Other: Mild soft tissue swelling/hematoma overlying the right posterior vertex. CT CERVICAL SPINE FINDINGS Alignment: Normal cervical lordosis. Skull base and vertebrae: No acute fracture. No primary bone lesion or focal pathologic process. Soft tissues and spinal canal: No prevertebral fluid or swelling. No visible canal hematoma. Disc levels:  Mild degenerative changes of the mid cervical spine. Spinal canal is patent. Upper chest: Visualized lung apices are notable for biapical pleural-parenchymal scarring. Other: Visualized thyroid is unremarkable. IMPRESSION: Mild soft tissue swelling/hematoma overlying the right posterior vertex. No evidence of calvarial fracture. No evidence of acute intracranial abnormality. Atrophy with small vessel ischemic changes. Opacification of the right mastoid air cells. No evidence of traumatic injury to the cervical spine. Electronically Signed   By: Julian Hy M.D.   On: 05/13/2017 11:28   Ct Lumbar Spine Wo Contrast  Result Date: 05/13/2017 CLINICAL DATA:  Status post fall yesterday.  Back pain. EXAM: CT LUMBAR SPINE WITHOUT CONTRAST TECHNIQUE: Multidetector CT imaging of the lumbar spine was performed without intravenous contrast administration. Multiplanar CT image reconstructions were also generated. COMPARISON:  None. FINDINGS:  Segmentation: 5 lumbar type vertebrae. Alignment: Normal. Vertebrae: Generalized osteopenia. L1 compression fracture with approximately 50% central height loss. 3 mm retropulsion of the posterior inferior margin of L1 vertebral body with minimal impression on the thecal sac. Fracture does not extend into the pedicles or posterior elements. Remainder the vertebral body heights are maintained. No aggressive osseous lesion. No discitis or osteomyelitis. Paraspinal and other soft tissues: No focal paraspinal abnormality. Abdominal aortic atherosclerosis. Other: Osteoarthritis of bilateral sacroiliac joints. 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 Bilateral neural foramina are patent. Bilateral facet arthropathy at L1-2, L2-3, and L3-4. IMPRESSION: 1. Acute L1 vertebral body compression fracture with approximately 50% central height loss. Electronically Signed   By: Kathreen Devoid   On: 05/13/2017 13:28   TTE Study Conclusions  - Left ventricle: The cavity size was normal. Systolic function was   normal. The estimated ejection fraction was in the range of 55%   to 60%. Wall motion was normal; there were no regional wall   motion abnormalities. Doppler parameters are consistent with   abnormal left ventricular relaxation (grade 1 diastolic   dysfunction). - Mitral valve: There was mild regurgitation. - Left atrium: The atrium was normal in size. - Right ventricle: Systolic function was normal. - Pulmonary arteries: Systolic pressure was within the normal   range.  Impressions:  - There was no evidence of a vegetation.  Assessment:   Miyana Mordecai is a 77 y.o. female with RA, recent admission for fall and L1 fx, recent influenza now with MRSA bacteremia and imaging showing possible RUL mass vs abscess. Has Klebsiella UTI. TTE negative. She has FTT and a R buttocks wound which could be a source of the bacteremia.  FU bcx 2/17 ngtd.  I discussed with her  and her son the seriousness of MRSA bacteremia. Discussed need for minimum of 2 weeks IV abx therapy, as well as further evaluation for endocarditis with TEE. Will also need further work up for the RUL mass.   Her son however would like to have her be stronger prior to undergoing the TEE or bronch and I think it is ok to hold on the TEE - can do as otpt if needed.  It would not change the management until we come to the 2 week treatment mark.  However I did  ask him to consider not delaying the Bronch as if there is a malignancy it will need to be addressed ASAP.   Recommendations Will need to have picc placed 2/19 if bcx from 2/17 remains negative Cont vanco for 2 weeks total from 2/17 if fu bcx negative Can change ceftriaxone to oral keflex for the UTI for a total 7 days- stop date 05/28/17 Other things to consider are whether she needs MRI of her L spine and if the L 1 fx could be pathologic.  Thank you very much for allowing me to participate in the care of this patient. Please call with questions.   Cheral Marker. Ola Spurr, MD

## 2017-05-25 NOTE — Progress Notes (Addendum)
Clinical Social Worker (CSW) met with patient to provide SNF bed offers. [facility] chose [facility]. Per [facility] liaison he will start [employer] SNF authorization. CSW left a voicemail for patient's son Rick making him aware of above.  Patient's son Rick called CSW back and is in agreement with plan. CSW also made Joseph aware that patient will need a PICC line for IV ABX.    , LCSW [phone]  

## 2017-05-25 NOTE — Progress Notes (Signed)
ELECTROLYTE CONSULT NOTE - INITIAL   Pharmacy Consult for electrolyte monitoring/replacement Indication: hypokalemia  No Known Allergies  Patient Measurements: Height: 4\' 8"  (142.2 cm) Weight: 105 lb (47.6 kg) IBW/kg (Calculated) : 36.3  Vital Signs: Temp: 99.3 F (37.4 C) (02/18 0805) Temp Source: Oral (02/18 0805) BP: 157/85 (02/18 0805) Pulse Rate: 105 (02/18 0805) Intake/Output from previous day: 02/17 0701 - 02/18 0700 In: 352.5 [I.V.:252.5; IV Piggyback:100] Out: 400 [Urine:400] Intake/Output from this shift: No intake/output data recorded.  Labs: Recent Labs    05/23/17 0331 05/24/17 0348 05/25/17 0242  WBC 11.1*  --   --   HGB 11.0*  --   --   HCT 32.6*  --   --   PLT 350  --   --   CREATININE 0.35* 0.38* 0.31*  MG 2.0  --  1.5*  PHOS  --   --  2.9   Estimated Creatinine Clearance: 38.5 mL/min (A) (by C-G formula based on SCr of 0.31 mg/dL (L)).  Assessment: Pharmacy consulted to assist with electrolyte monitoring/replacement in this 77 year old female admitted with UTI, MRSA bacteremia, and FTT.  Goal of Therapy: Electrolytes WNL  Plan: Mag = 1.5, will order MagSulfate 2g IV times one dose. Follow up on AM labs.  Paulina Fusi, PharmD, BCPS 05/25/2017 3:51 PM

## 2017-05-25 NOTE — Progress Notes (Signed)
[facility] [place] NAME: Linda Walsh    MR#:  [id number]  DATE OF BIRTH:  1941/03/23  SUBJECTIVE: Admitted for failure to thrive, weakness and found to have UTI, hypokalemia.  Patient found to have MRSA bacteremia,.  To have possible bronchogenic carcinoma.  Still has poor p.o. intake.  She does have cough.  CHIEF COMPLAINT:   Chief Complaint  Patient presents with  . Failure To Thrive    REVIEW OF SYSTEMS:   ROS CONSTITUTIONAL: Cough, complains of back pain EYES: No blurred or double vision.  EARS, NOSE, AND THROAT: No tinnitus or ear pain.  RESPIRATORY: Cough,  No shortness of breath, wheezing or hemoptysis.  CARDIOVASCULAR: No chest pain, orthopnea, edema.  GASTROINTESTINAL: No nausea, vomiting, diarrhea or abdominal pain.  Poor p.o. intake GENITOURINARY: No dysuria, hematuria.  ENDOCRINE: No polyuria, nocturia,  HEMATOLOGY: No anemia, easy bruising or bleeding SKIN: Small decubiti on the right buttock MUSCULOSKELETAL: Diffuse joint pains NEUROLOGIC: No tingling, numbness, weakness.  PSYCHIATRY: No anxiety or depression.   DRUG ALLERGIES:  No Known Allergies  VITALS:  Blood pressure (!) 157/85, pulse (!) 105, temperature 99.3 F (37.4 C), temperature source Oral, resp. rate 18, height 4\' 8"  (1.422 m), weight 47.6 kg (105 lb), SpO2 97 %.  PHYSICAL EXAMINATION:  GENERAL:  77 y.o.-year-old patient lying in the bed with no acute distress.  Very  Frail individual EYES: Pupils equal, round, reactive to light.No scleral icterus. Extraocular muscles intact.  HEENT: Head atraumatic, normocephalic. Oropharynx and nasopharynx clear.  NECK:  Supple, no jugular venous distention. No thyroid enlargement, no tenderness.  LUNGS: Normal breath sounds bilaterally, no wheezing, rales,rhonchi or crepitation. No use of accessory muscles of respiration.  CARDIOVASCULAR: S1, S2 tachycardic.Marland Kitchen No murmurs, rubs, or gallops.  ABDOMEN: Soft,  nontender, nondistended. Bowel sounds present. No organomegaly or mass.  EXTREMITIES: No pedal edema, cyanosis, or clubbing.  NEUROLOGIC: Cranial nerves II through XII are intact. Muscle strength 5/5 in all extremities. Sensation intact. Gait not checked.  PSYCHIATRIC: The patient is alert and oriented x 3.  SKIN: Sacral decubiti on the right buttock with some eschar in the center, slight pus around the periphery but patient has no tenderness the size around 3-2 cm.  Consult wound care for staging, dressing recommendations.   LABORATORY PANEL:   CBC Recent Labs  Lab 05/23/17 0331  WBC 11.1*  HGB 11.0*  HCT 32.6*  PLT 350   ------------------------------------------------------------------------------------------------------------------  Chemistries  Recent Labs  Lab 05/25/17 0242  NA 127*  K 3.5  CL 93*  CO2 26  GLUCOSE 117*  BUN <5*  CREATININE 0.31*  CALCIUM 7.8*  MG 1.5*   ------------------------------------------------------------------------------------------------------------------  Cardiac Enzymes Recent Labs  Lab 05/22/17 1249  TROPONINI 0.03*   ------------------------------------------------------------------------------------------------------------------  RADIOLOGY:  No results found.  EKG:   Orders placed or performed during the hospital encounter of 05/22/17  . ED EKG  . ED EKG  . EKG 12-Lead  . EKG 12-Lead  . EKG    ASSESSMENT AND PLAN:  77 year old female with past medical history of rheumatoid arthritis, recent L1 fracture, right clavicle fracture managed nonoperatively and also has asthma, anxiety, restless leg syndrome comes from group home because of generalized weakness, poor p.o. intake and found to have severe hypokalemia, UTI. #1 MRSA bacteremia: Patient is on vancomycin, seen by ID Dr. Drucilla Schmidt , ID physician from [facility], echocardiogram shows EF around 55-60% without any vegetations.  Further workup, CT chest is done that  did not show  any PE but showed possible bronchogenic carcinoma on the right side.  Spoke with Dr. Devona Konig, patient needs EBUS.Marland Kitchen Continue vancomycin.  Possible lung abscess>? Blood cultures repeated, on FEB  17th so far negative for 1 day 2.  Severe hypokalemia secondary to poor p.o. intake.  Pharmacy consulted for  ELECTROLYTE  management.  Hypokalemia improving. Continue IV hydration because patient  p.o. intake is still very poor  3.  Generalized weakness: Get physical therapy consult  4.  Hyperlipidemia: Continue atorvastatin. 5.  Asthma: No wheezing.  But does have cough.  Continue bronchodilators, Tussionex.,  Further workup for possible bronchogenic carcinoma including pulmonary consult, oncology consult  6.  History of rheumatoid arthritis: Continue Plaquenil. 7.  Restless leg syndrome: Continue Requip. Essential hypertension and now has tachycardia continue Toprol-XL. #8/. pressure ulcer on the right gluteal region: C wound care consulted, recommended daily dressing changes a with Santyl. #9 Klebsiella UTI: Patient is on Rocephin. Prognosis poor secondary to multiple medical problems, poor p.o. intake, possible lung cancer with MRSA bacteremia now. All the records are reviewed and case discussed with Care Management/Social Workerr. Management plans discussed with the patient, family and they are in agreement.  CODE STATUS: Full code  TOTAL TIME TAKING CARE OF THIS PATIENT: 35 minutes.   POSSIBLE D/C IN 1-2 DAYS, DEPENDING ON CLINICAL CONDITION.   Epifanio Lesches M.D on 05/25/2017 at 11:58 AM  Between 7am to 6pm - Pager - [phone]  After 6pm go to [email or web address] - password [email or web address] [facility]  Office  [phone]  CC: Primary care physician; Remi Haggard, FNP   Note: This dictation was prepared with Dragon dictation along with smaller phrase technology. Any transcriptional errors that result from this process are unintentional.

## 2017-05-25 NOTE — Consult Note (Signed)
[facility] Nurse wound consult note Reason for Consult:Unstageable pressure injury to right buttocks Wound type:pressure injury Pressure Injury POA: Yes Measurement: 2.5 cm x 2 cm wound bed is 75% adherent slough Wound bed: adherent slough Drainage (amount, consistency, odor) minimal serosanguinous  No odor Periwound:intact Dressing procedure/placement/frequency:Cleanse wound to right buttock with NS and pat dry.  Apply Santyl to wound bed.  Cover with NS moist gauze.  Secure with ABD pad and tape.  Change daily.  Will not follow at this time.  Please re-consult if needed.  Domenic Moras RN BSN [facility] Pager [phone]

## 2017-05-26 ENCOUNTER — Other Ambulatory Visit: Payer: Self-pay | Admitting: *Deleted

## 2017-05-26 ENCOUNTER — Telehealth: Payer: Self-pay | Admitting: Oncology

## 2017-05-26 DIAGNOSIS — R918 Other nonspecific abnormal finding of lung field: Secondary | ICD-10-CM

## 2017-05-26 LAB — BASIC METABOLIC PANEL
Anion gap: 10 (ref 5–15)
BUN: 7 mg/dL (ref 6–20)
CALCIUM: 7.6 mg/dL — AB (ref 8.9–10.3)
CHLORIDE: 91 mmol/L — AB (ref 101–111)
CO2: 25 mmol/L (ref 22–32)
CREATININE: 0.53 mg/dL (ref 0.44–1.00)
GFR calc Af Amer: >60 mL/min (ref >60)
GFR calc non Af Amer: >60 mL/min (ref >60)
Glucose, Bld: 137 mg/dL — ABNORMAL HIGH (ref 65–99)
Potassium: 3 mmol/L — ABNORMAL LOW (ref 3.5–5.1)
SODIUM: 126 mmol/L — AB (ref 135–145)

## 2017-05-26 LAB — C-REACTIVE PROTEIN: CRP: 20.6 mg/dL — ABNORMAL HIGH (ref <1.0)

## 2017-05-26 LAB — SEDIMENTATION RATE

## 2017-05-26 LAB — MAGNESIUM: MAGNESIUM: 2.3 mg/dL (ref 1.7–2.4)

## 2017-05-26 MED ORDER — POTASSIUM CHLORIDE CRYS ER 20 MEQ PO TBCR
40.0000 meq | EXTENDED_RELEASE_TABLET | Freq: Once | ORAL | Status: AC
  Administered 2017-05-26: 40 meq via ORAL
  Filled 2017-05-26: qty 2

## 2017-05-26 MED ORDER — POTASSIUM CHLORIDE CRYS ER 20 MEQ PO TBCR
20.0000 meq | EXTENDED_RELEASE_TABLET | Freq: Every day | ORAL | Status: DC
  Administered 2017-05-26 – 2017-06-01 (×6): 20 meq via ORAL
  Filled 2017-05-26 (×6): qty 1

## 2017-05-26 NOTE — Progress Notes (Signed)
[place] INFECTIOUS DISEASE PROGRESS NOTE Date of Admission:  05/22/2017     ID: Linda Walsh is a 77 y.o. female with  MRSA bacteremia Principal Problem:   MRSA bacteremia Active Problems:   UTI (urinary tract infection)   Pressure injury of skin   Subjective: No fevers, still very weak, not eating much. When asked about pain mentions mainly in her back   ROS  Eleven systems are reviewed and negative except per hpi  Medications:  Antibiotics Given (last 72 hours)    Date/Time Action Medication Dose Rate   05/23/17 1520 New Bag/Given  [med not available]   cefTRIAXone (ROCEPHIN) 1 g in sodium chloride 0.9 % 100 mL IVPB 1 g 200 mL/hr   05/24/17 0829 New Bag/Given   vancomycin (VANCOCIN) IVPB 1000 mg/200 mL premix 1,000 mg 200 mL/hr   05/24/17 1056 New Bag/Given   cefTRIAXone (ROCEPHIN) 1 g in sodium chloride 0.9 % 100 mL IVPB 1 g 200 mL/hr   05/24/17 1057 Given   hydroxychloroquine (PLAQUENIL) tablet 400 mg 400 mg    05/25/17 1031 New Bag/Given   cefTRIAXone (ROCEPHIN) 1 g in sodium chloride 0.9 % 100 mL IVPB 1 g 200 mL/hr   05/25/17 1033 Given   hydroxychloroquine (PLAQUENIL) tablet 400 mg 400 mg    05/25/17 1054 New Bag/Given   vancomycin (VANCOCIN) IVPB 750 mg/150 ml premix 750 mg 150 mL/hr   05/25/17 1525 Given   cephALEXin (KEFLEX) capsule 500 mg 500 mg    05/25/17 2103 Given   cephALEXin (KEFLEX) capsule 500 mg 500 mg    05/26/17 1021 Given   cephALEXin (KEFLEX) capsule 500 mg 500 mg    05/26/17 1022 Given   hydroxychloroquine (PLAQUENIL) tablet 400 mg 400 mg    05/26/17 1044 New Bag/Given   vancomycin (VANCOCIN) IVPB 750 mg/150 ml premix 750 mg 150 mL/hr     . aspirin EC  81 mg Oral Daily  . atorvastatin  20 mg Oral Daily  . budesonide  2 mL Nebulization Daily  . cephALEXin  500 mg Oral Q12H  . cholecalciferol  1,000 Units Oral Daily  . collagenase   Topical Daily  . enoxaparin (LOVENOX) injection  40 mg Subcutaneous Q24H  . feeding supplement  (ENSURE ENLIVE)  237 mL Oral BID BM  . ferrous sulfate  325 mg Oral BID WC  . hydroxychloroquine  400 mg Oral Daily  . loratadine  10 mg Oral Daily  . LORazepam  1 mg Oral QPM  . Melatonin  5 mg Oral QHS  . metaxalone  800 mg Oral TID  . metoprolol succinate  50 mg Oral Daily  . mirtazapine  30 mg Oral QHS  . mometasone-formoterol  2 puff Inhalation BID  . multivitamin with minerals  1 tablet Oral Daily  . pantoprazole  40 mg Oral Daily  . potassium chloride  20 mEq Oral Daily  . potassium chloride  40 mEq Oral Once  . rOPINIRole  0.5 mg Oral QHS  . senna-docusate  1 tablet Oral BID    Objective: Vital signs in last 24 hours: Temp:  [98.5 F (36.9 C)-99 F (37.2 C)] 98.5 F (36.9 C) (02/19 0445) Pulse Rate:  [91-96] 91 (02/19 0445) Resp:  [16] 16 (02/18 2018) BP: (118-143)/(61-74) 118/61 (02/19 0445) SpO2:  [93 %-96 %] 95 % (02/19 0808) Constitutional:  Hoh, oriented to person, place, and time. Thin, frail, lying on l side in bed HENT: [place]/AT, PERRLA, no scleral icterus Mouth/Throat: Oropharynx is clear and  dry . No oropharyngeal exudate.  Cardiovascular: Normal rate, regular rhythm and normal heart sounds. 2/6 sm Pulmonary/Chest: poor air movement, rhonchi throughoug Neck = supple, no nuchal rigidity Abdominal: Soft. Bowel sounds are normal.  exhibits no distension. There is no tenderness.  Lymphadenopathy: no cervical adenopathy. No axillary adenopathy Neurological: alert and oriented to person, place, and time.  Skin: Skin is warm and dry. R buttock with 2x3 cm irregular shallow ulceration with slough, mild surrounding erythema Psychiatric: very flat affect Ext no cce, has RA changes of hands ttp of l spine    Lab Results Recent Labs    05/25/17 0242 05/26/17 0419  NA 127* 126*  K 3.5 3.0*  CL 93* 91*  CO2 26 25  BUN <5* 7  CREATININE 0.31* 0.53    Microbiology: Results for orders placed or performed during the hospital encounter of 05/22/17  Urine Culture      Status: Abnormal   Collection Time: 05/22/17 12:19 PM  Result Value Ref Range Status   Specimen Description   Final    URINE, CLEAN CATCH Performed at [facility], [address] [place], [place] [address]    Special Requests   Final    Normal Performed at [facility], [address] [place], [place] [address]    Culture >=100,000 COLONIES/mL KLEBSIELLA PNEUMONIAE (A)  Final   Report Status 05/25/2017 FINAL  Final   Organism ID, Bacteria KLEBSIELLA PNEUMONIAE (A)  Final      Susceptibility   Klebsiella pneumoniae - MIC*    AMPICILLIN >=32 RESISTANT Resistant     CEFAZOLIN <=4 SENSITIVE Sensitive     CEFTRIAXONE <=1 SENSITIVE Sensitive     CIPROFLOXACIN <=0.25 SENSITIVE Sensitive     GENTAMICIN <=1 SENSITIVE Sensitive     IMIPENEM <=0.25 SENSITIVE Sensitive     NITROFURANTOIN 64 INTERMEDIATE Intermediate     TRIMETH/SULFA <=20 SENSITIVE Sensitive     AMPICILLIN/SULBACTAM 4 SENSITIVE Sensitive     PIP/TAZO <=4 SENSITIVE Sensitive     Extended ESBL NEGATIVE Sensitive     * >=100,000 COLONIES/mL KLEBSIELLA PNEUMONIAE  Blood Culture (routine x 2)     Status: Abnormal   Collection Time: 05/22/17  2:13 PM  Result Value Ref Range Status   Specimen Description   Final    BLOOD BLOOD RIGHT HAND Performed at [facility] [address] [place], [place] [address]    Special Requests   Final    BOTTLES DRAWN AEROBIC AND ANAEROBIC Blood Culture results may not be optimal due to an inadequate volume of blood received in culture bottles Performed at [facility], [address] [place],  [address]    Culture  Setup Time   Final    GRAM POSITIVE COCCI IN BOTH AEROBIC AND ANAEROBIC BOTTLES CRITICAL RESULT CALLED TO, READ BACK BY AND VERIFIED WITH: Neel Buffone BESANTI AT [id number] 05/23/17.PMH Performed at [facility], [place] [address] [place], [place] [address]    Culture METHICILLIN RESISTANT STAPHYLOCOCCUS AUREUS (A)  Final   Report  Status 05/25/2017 FINAL  Final   Organism ID, Bacteria METHICILLIN RESISTANT STAPHYLOCOCCUS AUREUS  Final      Susceptibility   Methicillin resistant staphylococcus aureus - MIC*    CIPROFLOXACIN <=0.5 SENSITIVE Sensitive     ERYTHROMYCIN >=8 RESISTANT Resistant     GENTAMICIN <=0.5 SENSITIVE Sensitive     OXACILLIN >=4 RESISTANT Resistant     TETRACYCLINE <=1 SENSITIVE Sensitive     VANCOMYCIN 1 SENSITIVE Sensitive  TRIMETH/SULFA <=10 SENSITIVE Sensitive     CLINDAMYCIN <=0.25 SENSITIVE Sensitive     RIFAMPIN <=0.5 SENSITIVE Sensitive     Inducible Clindamycin NEGATIVE Sensitive     * METHICILLIN RESISTANT STAPHYLOCOCCUS AUREUS  Blood Culture (routine x 2)     Status: Abnormal   Collection Time: 05/22/17  2:13 PM  Result Value Ref Range Status   Specimen Description   Final    BLOOD BLOOD RIGHT FOREARM Performed at [facility], [address] [place], [place] [address]    Special Requests   Final    BOTTLES DRAWN AEROBIC AND ANAEROBIC Blood Culture adequate volume Performed at [facility], [address] [place], [place] [address]    Culture  Setup Time   Final    GRAM POSITIVE COCCI IN BOTH AEROBIC AND ANAEROBIC BOTTLES CRITICAL RESULT CALLED TO, READ BACK BY AND VERIFIED WITH: Cathlin Buchan BESANTI AT [id number] 05/23/17.PMH Performed at [facility], [address] [place], [place] [address]    Culture (A)  Final    STAPHYLOCOCCUS AUREUS SUSCEPTIBILITIES PERFORMED ON PREVIOUS CULTURE WITHIN THE LAST 5 DAYS. Performed at [facility], [place] [address] [place], [place] [address]    Report Status 05/25/2017 FINAL  Final  Blood Culture ID Panel (Reflexed)     Status: Abnormal   Collection Time: 05/22/17  2:13 PM  Result Value Ref Range Status   Enterococcus species NOT DETECTED NOT DETECTED Final   Listeria monocytogenes NOT DETECTED NOT DETECTED Final   Staphylococcus species DETECTED (A) NOT DETECTED Final    Comment: CRITICAL RESULT CALLED  TO, READ BACK BY AND VERIFIED WITH: Baelyn Doring BESANTI AT [id number] 05/23/17.PMH    Staphylococcus aureus DETECTED (A) NOT DETECTED Final    Comment: Methicillin (oxacillin)-resistant Staphylococcus aureus (MRSA). MRSA is predictably resistant to beta-lactam antibiotics (except ceftaroline). Preferred therapy is vancomycin unless clinically contraindicated. Patient requires contact precautions if  hospitalized. CRITICAL RESULT CALLED TO, READ BACK BY AND VERIFIED WITH: Sukhman Martine BESANTI AT 9678 05/23/17.PMH    Methicillin resistance DETECTED (A) NOT DETECTED Final    Comment: CRITICAL RESULT CALLED TO, READ BACK BY AND VERIFIED WITH: Karmah Potocki BESANTI AT 9381 05/23/17.PMH    Streptococcus species NOT DETECTED NOT DETECTED Final   Streptococcus agalactiae NOT DETECTED NOT DETECTED Final   Streptococcus pneumoniae NOT DETECTED NOT DETECTED Final   Streptococcus pyogenes NOT DETECTED NOT DETECTED Final   Acinetobacter baumannii NOT DETECTED NOT DETECTED Final   Enterobacteriaceae species NOT DETECTED NOT DETECTED Final   Enterobacter cloacae complex NOT DETECTED NOT DETECTED Final   Escherichia coli NOT DETECTED NOT DETECTED Final   Klebsiella oxytoca NOT DETECTED NOT DETECTED Final   Klebsiella pneumoniae NOT DETECTED NOT DETECTED Final   Proteus species NOT DETECTED NOT DETECTED Final   Serratia marcescens NOT DETECTED NOT DETECTED Final   Haemophilus influenzae NOT DETECTED NOT DETECTED Final   Neisseria meningitidis NOT DETECTED NOT DETECTED Final   Pseudomonas aeruginosa NOT DETECTED NOT DETECTED Final   Candida albicans NOT DETECTED NOT DETECTED Final   Candida glabrata NOT DETECTED NOT DETECTED Final   Candida krusei NOT DETECTED NOT DETECTED Final   Candida parapsilosis NOT DETECTED NOT DETECTED Final   Candida tropicalis NOT DETECTED NOT DETECTED Final    Comment: Performed at [facility], [address] [place], [place] [address]  Culture, blood (Routine X 2) w Reflex to ID Panel      Status: None (Preliminary result)   Collection Time: 05/24/17  9:48 AM  Result Value Ref Range Status   Specimen Description BLOOD LEFT ANTECUBITAL  Final   Special Requests   Final    BOTTLES DRAWN AEROBIC AND ANAEROBIC Blood Culture adequate volume   Culture   Final    NO GROWTH 2 DAYS Performed at [facility], [address] [place], [place] [address]    Report Status PENDING  Incomplete  Culture, blood (Routine X 2) w Reflex to ID Panel     Status: None (Preliminary result)   Collection Time: 05/24/17 10:04 AM  Result Value Ref Range Status   Specimen Description BLOOD BLOOD LEFT HAND  Final   Special Requests   Final    BOTTLES DRAWN AEROBIC AND ANAEROBIC Blood Culture adequate volume   Culture   Final    NO GROWTH 2 DAYS Performed at [facility], [address], [place], [place] [address]    Report Status PENDING  Incomplete     Studies/Results: No results found.  Assessment/Plan: Sebrena Engh is a 77 y.o. female with RA, recent admission for fall and L1 fx, recent influenza now with MRSA bacteremia and imaging showing possible RUL mass vs abscess. Has Klebsiella UTI. TTE negative. She has FTT and a R buttocks wound which could be a source of the bacteremia.  FU bcx 2/17 ngtd.  I had initially discussed with her and her son the seriousness of MRSA bacteremia. Discussed need for minimum of 2 weeks IV abx therapy, as well as further evaluation for endocarditis with TEE. Will also need further work up for the RUL mass.  Her son however would like to have her be stronger prior to undergoing the TEE or bronch and I think it is ok to hold on the TEE - can do as otpt if needed.  It would not change the management until we come to the 2 week treatment mark. However I did ask him to consider not delaying the Bronch as if there is a malignancy it will need to be addressed ASAP.   Feb 19 - esr > 140 and crp 20.6 No family at bedside.  Recommendations Can have  picc placed since bcx from 2/17 remains negative Cont vanco for 2 weeks total from 2/17 if fu bcx negative Cont  keflex for the UTI for a total 7 days- stop date 05/28/17 Her ESR and CRP are quite high but difficult to interpret in setting of her RA However given her back pain high concern for discitis - will check  MRI of her L spine   Thank you very much for the consult. Will follow with you.  Leonel Ramsay   05/26/2017, 2:01 PM

## 2017-05-26 NOTE — Progress Notes (Signed)
ELECTROLYTE CONSULT NOTE - INITIAL   Pharmacy Consult for electrolyte monitoring/replacement Indication: hypokalemia  No Known Allergies  Patient Measurements: Height: 4\' 8"  (142.2 cm) Weight: 105 lb (47.6 kg) IBW/kg (Calculated) : 36.3  Vital Signs: Temp: 98.5 F (36.9 C) (02/19 0445) Temp Source: Oral (02/19 0445) BP: 118/61 (02/19 0445) Pulse Rate: 91 (02/19 0445) Intake/Output from previous day: 02/18 0701 - 02/19 0700 In: 2097.5 [P.O.:240; I.V.:1607.5; IV Piggyback:250] Out: -  Intake/Output from this shift: No intake/output data recorded.  Labs: Recent Labs    05/24/17 0348 05/25/17 0242 05/26/17 0419  CREATININE 0.38* 0.31* 0.53  MG  --  1.5* 2.3  PHOS  --  2.9  --    Estimated Creatinine Clearance: 38.5 mL/min (by C-G formula based on SCr of 0.53 mg/dL).  Assessment: Pharmacy consulted to assist with electrolyte monitoring/replacement in this 77 year old female admitted with UTI, MRSA bacteremia, and FTT.  Goal of Therapy: Electrolytes WNL  Plan: Mg = 2.3 WNL K = 3.0 is low. Will order KCl 40 mEq PO once and start KCl 20 mEq PO daily.  Will recheck electrolytes with AM labs tomorrow.   Lenis Noon, PharmD, BCPS 05/26/2017 11:43 AM

## 2017-05-26 NOTE — Progress Notes (Addendum)
Per Broadus John, [facility] liaison, [facility] SN[facility] authorization has been recieved. Patient is aware of above. Per MD patient is not ready for discharge today and should be in the next few days.  Susy Frizzle, Social Work Intern [phone]

## 2017-05-26 NOTE — Progress Notes (Signed)
Physical Therapy Treatment Patient Details Name: Linda Walsh MRN: [id number] DOB: [birth year] Today's Date: 05/26/2017    History of Present Illness Pt admitted for complaints of weakness and now with UTI. Pt with history of cardiomyopathy, RA, depression and recently hospitalized for flue. Of note, pt with recent fall with L1 fracture and R clavicular fx mangaged non-op.    PT Comments    Participated in exercises as described below.  Pt received pain medication during session.  Limited bed mobility due to pain.  Pt requires TSLO for out of bed per chart review.  Brace not in room for session today.  Primary nurse aware and to follow up.     Follow Up Recommendations  SNF     Equipment Recommendations       Recommendations for Other Services       Precautions / Restrictions Precautions Precautions: Fall Required Braces or Orthoses: Other Brace/Splint Other Brace/Splint: TLSO brace for OOB Restrictions Weight Bearing Restrictions: No RUE Weight Bearing: Non weight bearing Other Position/Activity Restrictions: per chart review    Mobility  Bed Mobility               General bed mobility comments: deferred due to pain when initiated.  Primary nurse in to give pain meds.  Transfers                    Ambulation/Gait             General Gait Details: deferred as TSLO is not in room.  Pt stated it is at home.     Stairs            Wheelchair Mobility    Modified Rankin (Stroke Patients Only)       Balance                                            Cognition Arousal/Alertness: Awake/alert Behavior During Therapy: WFL for tasks assessed/performed Overall Cognitive Status: Within Functional Limits for tasks assessed                                        Exercises General Exercises - Lower Extremity Ankle Circles/Pumps: AAROM;Both;10 reps;Supine Short Arc Quad: 10 reps;AAROM;Supine;Both Heel  Slides: 10 reps;AAROM;Supine;Both Hip ABduction/ADduction: 10 reps;AAROM;Supine;Both Straight Leg Raises: 10 reps;AAROM;Supine;Both    General Comments        Pertinent Vitals/Pain Pain Assessment: Faces Faces Pain Scale: Hurts even more Pain Location: mid back, pelvis Pain Descriptors / Indicators: Aching Pain Intervention(s): Limited activity within patient's tolerance;RN gave pain meds during session    Home Living                      Prior Function            PT Goals (current goals can now be found in the care plan section) Progress towards PT goals: Progressing toward goals    Frequency    Min 2X/week      PT Plan Current plan remains appropriate    Co-evaluation              AM-PAC PT "6 Clicks" Daily Activity  Outcome Measure  Difficulty turning over in bed (including adjusting bedclothes, sheets and blankets)?: Unable Difficulty moving from lying  on back to sitting on the side of the bed? : Unable Difficulty sitting down on and standing up from a chair with arms (e.g., wheelchair, bedside commode, etc,.)?: Unable Help needed moving to and from a bed to chair (including a wheelchair)?: Total Help needed walking in hospital room?: Total Help needed climbing 3-5 steps with a railing? : Total 6 Click Score: 6    End of Session Equipment Utilized During Treatment: Gait belt Activity Tolerance: Patient limited by pain Patient left: in bed;with bed alarm set;with call bell/phone within reach Nurse Communication: Mobility status       Time: 8250-0370 PT Time Calculation (min) (ACUTE ONLY): 14 min  Charges:  $Therapeutic Exercise: 8-22 mins                    G Codes:       Chesley Noon, PTA 05/26/17, 11:47 AM

## 2017-05-26 NOTE — Telephone Encounter (Signed)
I spoke  To Dr. Vianne Bulls on 05/24/17 regarding this patient who was found to have 4 cm RUL lung mass and waiting to undergo bronchoscopy. This is highly suspicious for lung cancer and she will need PET/CT scan to complete her staging work up and I will see her as an outpatient after PET/CT  Dr. Randa Evens, MD, MPH [facility] [phone] 05/26/2017 8:30 AM

## 2017-05-26 NOTE — Progress Notes (Signed)
[place] [facility] [place] NAME: Linda Walsh    MR#:  [id number]  DATE OF BIRTH:  1940/05/25  SUBJECTIVE: Admitted for failure to thrive, weakness and found to have UTI, hypokalemia.  Patient found to have MRSA bacteremia,.  To have possible bronchogenic carcinoma.  Continues to have poor p.o. intake complains of back pain, pleuritic chest pain  CHIEF COMPLAINT:   Chief Complaint  Patient presents with  . Failure To Thrive    REVIEW OF SYSTEMS:   ROS CONSTITUTIONAL: Cough, complains of back pain EYES: No blurred or double vision.  EARS, NOSE, AND THROAT: No tinnitus or ear pain.  RESPIRATORY: Cough,  No shortness of breath, wheezing or hemoptysis.  CARDIOVASCULAR: No chest pain, orthopnea, edema.  GASTROINTESTINAL: No nausea, vomiting, diarrhea or abdominal pain.  Poor p.o. intake GENITOURINARY: No dysuria, hematuria.  ENDOCRINE: No polyuria, nocturia,  HEMATOLOGY: No anemia, easy bruising or bleeding SKIN: Small decubiti on the right buttock MUSCULOSKELETAL: Diffuse joint pains NEUROLOGIC: No tingling, numbness, weakness.  PSYCHIATRY: No anxiety or depression.   DRUG ALLERGIES:  No Known Allergies  VITALS:  Blood pressure 118/61, pulse 91, temperature 98.5 F (36.9 C), temperature source Oral, resp. rate 16, height 4\' 8"  (1.422 m), weight 47.6 kg (105 lb), SpO2 95 %.  PHYSICAL EXAMINATION:  GENERAL:  77 y.o.-year-old patient lying in the bed with no acute distress.  Very  Frail individual EYES: Pupils equal, round, reactive to light.No scleral icterus. Extraocular muscles intact.  HEENT: Head atraumatic, normocephalic. Oropharynx and nasopharynx clear.  NECK:  Supple, no jugular venous distention. No thyroid enlargement, no tenderness.  LUNGS: Normal breath sounds bilaterally, no wheezing, rales,rhonchi or crepitation. No use of accessory muscles of respiration.  CARDIOVASCULAR: S1, S2 tachycardic.Marland Kitchen No murmurs, rubs, or  gallops.  ABDOMEN: Soft, nontender, nondistended. Bowel sounds present. No organomegaly or mass.  EXTREMITIES: No pedal edema, cyanosis, or clubbing.  NEUROLOGIC: Cranial nerves II through XII are intact. Muscle strength 5/5 in all extremities. Sensation intact. Gait not checked.  PSYCHIATRIC: The patient is alert and oriented x 3.  SKIN: Sacral decubiti on the right buttock with some eschar in the center, slight pus around the periphery but patient has no tenderness the size around 3-2 cm.  Consult wound care for staging, dressing recommendations.   LABORATORY PANEL:   CBC Recent Labs  Lab 05/23/17 0331  WBC 11.1*  HGB 11.0*  HCT 32.6*  PLT 350   ------------------------------------------------------------------------------------------------------------------  Chemistries  Recent Labs  Lab 05/26/17 0419  NA 126*  K 3.0*  CL 91*  CO2 25  GLUCOSE 137*  BUN 7  CREATININE 0.53  CALCIUM 7.6*  MG 2.3   ------------------------------------------------------------------------------------------------------------------  Cardiac Enzymes Recent Labs  Lab 05/22/17 1249  TROPONINI 0.03*   ------------------------------------------------------------------------------------------------------------------  RADIOLOGY:  No results found.  EKG:   Orders placed or performed during the hospital encounter of 05/22/17  . ED EKG  . ED EKG  . EKG 12-Lead  . EKG 12-Lead  . EKG    ASSESSMENT AND PLAN:  77 year old female with past medical history of rheumatoid arthritis, recent L1 fracture, right clavicle fracture managed nonoperatively and also has asthma, anxiety, restless leg syndrome comes from group home because of generalized weakness, poor p.o. intake and found to have severe hypokalemia, UTI. #1 MRSA bacteremia: Patient is on vancomycin, repeat blood cultures have been negative for 48 hrs. from February 17.  Patient did get a PICC line for 2 weeks of antibiotics  With   vancomycin.   echocardiogram shows EF around 55-60% without any vegetations.  Further workup, CT chest is done that did not show any PE but showed possible bronchogenic carcinoma on the right side.  Spoke with Dr. Devona Konig, patient needs EBUS.Marland Kitchen Continue vancomycin.   Patient is vancomycin for 2 weeks from 2/17.  patient's son to consider bronchoscopy for evaluation of possible right upper lobe mass./Abscess  2.  Severe hypokalemia secondary to poor p.o. intake.  Pharmacy consulted for  ELECTROLYTE  management.  Hypokalemia improving. Continue IV hydration  3.  Generalized weakness: Get physical therapy consulted 4.  Hyperlipidemia: Continue atorvastatin. 5.  Asthma: No wheezing.  But does have cough.  Continue bronchodilators, Tussionex.,  Further workup for possible bronchogenic carcinoma/abcess including pulmonary consult, oncology consult  6.  History of rheumatoid arthritis: Continue Plaquenil. 7.  Restless leg syndrome: Continue Requip. Essential hypertension and now has tachycardia continue Toprol-XL. #8/. pressure ulcer on the right gluteal region: C wound care consulted, recommended daily dressing changes a with Santyl. #9 Klebsiella UTI: Patient is on Rocephin.  changed to Keflex stop date the 21st. Prognosis poor secondary to multiple medical problems, poor p.o. intake, possible lung cancer with MRSA bacteremia now.   All the records are reviewed and case discussed with Care Management/Social Workerr. Management plans discussed with the patient, family and they are in agreement.  CODE STATUS: Full code  TOTAL TIME TAKING CARE OF THIS PATIENT: 35 minutes.   POSSIBLE D/C IN 1-2 DAYS, DEPENDING ON CLINICAL CONDITION.   Epifanio Lesches M.D on 05/26/2017 at 1:14 PM  Between 7am to 6pm - Pager - [phone]  After 6pm go to [email or web address] - password [email or web address] [facility]  Office  [phone]  CC: Primary care physician; Remi Haggard,  FNP   Note: This dictation was prepared with Dragon dictation along with smaller phrase technology. Any transcriptional errors that result from this process are unintentional.

## 2017-05-27 ENCOUNTER — Inpatient Hospital Stay: Payer: Medicare Other

## 2017-05-27 LAB — CBC
HEMATOCRIT: 31 % — AB (ref 35.0–47.0)
Hemoglobin: 10.6 g/dL — ABNORMAL LOW (ref 12.0–16.0)
MCH: 31.1 pg (ref 26.0–34.0)
MCHC: 34.2 g/dL (ref 32.0–36.0)
MCV: 90.9 fL (ref 80.0–100.0)
PLATELETS: 424 10*3/uL (ref 150–440)
RBC: 3.41 MIL/uL — ABNORMAL LOW (ref 3.80–5.20)
RDW: 13.2 % (ref 11.5–14.5)
WBC: 6.8 10*3/uL (ref 3.6–11.0)

## 2017-05-27 LAB — BASIC METABOLIC PANEL
Anion gap: 8 (ref 5–15)
BUN: 8 mg/dL (ref 6–20)
CALCIUM: 7.7 mg/dL — AB (ref 8.9–10.3)
CHLORIDE: 97 mmol/L — AB (ref 101–111)
CO2: 27 mmol/L (ref 22–32)
CREATININE: 0.39 mg/dL — AB (ref 0.44–1.00)
GFR calc Af Amer: >60 mL/min (ref >60)
GFR calc non Af Amer: >60 mL/min (ref >60)
GLUCOSE: 96 mg/dL (ref 65–99)
Potassium: 3.9 mmol/L (ref 3.5–5.1)
Sodium: 132 mmol/L — ABNORMAL LOW (ref 135–145)

## 2017-05-27 LAB — VANCOMYCIN, TROUGH: Vancomycin Tr: 6 ug/mL — ABNORMAL LOW (ref 15–20)

## 2017-05-27 LAB — MAGNESIUM: Magnesium: 1.9 mg/dL (ref 1.7–2.4)

## 2017-05-27 MED ORDER — GADOBENATE DIMEGLUMINE 529 MG/ML IV SOLN
10.0000 mL | Freq: Once | INTRAVENOUS | Status: AC | PRN
  Administered 2017-05-27: 10 mL via INTRAVENOUS

## 2017-05-27 MED ORDER — SODIUM CHLORIDE 0.9% FLUSH: 10.0000 mL | INTRAVENOUS | Status: DC | PRN

## 2017-05-27 MED ORDER — SODIUM CHLORIDE 0.9% FLUSH
10.0000 mL | Freq: Two times a day (BID) | INTRAVENOUS | Status: DC
  Administered 2017-05-27: 40 mL
  Administered 2017-05-27 – 2017-06-01 (×11): 10 mL
  Administered 2017-06-02: 20 mL

## 2017-05-27 MED ORDER — VANCOMYCIN HCL IN DEXTROSE 750-5 MG/150ML-% IV SOLN
750.0000 mg | Freq: Two times a day (BID) | INTRAVENOUS | Status: DC
  Administered 2017-05-27 – 2017-05-28 (×3): 750 mg via INTRAVENOUS
  Filled 2017-05-27 (×5): qty 150

## 2017-05-27 NOTE — Progress Notes (Signed)
[facility] INFECTIOUS DISEASE PROGRESS NOTE Date of Admission:  05/22/2017     ID: Linda Walsh is a 77 y.o. female with  MRSA bacteremia Principal Problem:   MRSA bacteremia Active Problems:   UTI (urinary tract infection)   Pressure injury of skin   Subjective: No fevers, still very weak, not eating much. When asked about pain mentions mainly in her back   ROS  Eleven systems are reviewed and negative except per hpi  Medications:  Antibiotics Given (last 72 hours)    Date/Time Action Medication Dose Rate   05/25/17 1031 New Bag/Given   cefTRIAXone (ROCEPHIN) 1 g in sodium chloride 0.9 % 100 mL IVPB 1 g 200 mL/hr   05/25/17 1033 Given   hydroxychloroquine (PLAQUENIL) tablet 400 mg 400 mg    05/25/17 1054 New Bag/Given   vancomycin (VANCOCIN) IVPB 750 mg/150 ml premix 750 mg 150 mL/hr   05/25/17 1525 Given   cephALEXin (KEFLEX) capsule 500 mg 500 mg    05/25/17 2103 Given   cephALEXin (KEFLEX) capsule 500 mg 500 mg    05/26/17 1021 Given   cephALEXin (KEFLEX) capsule 500 mg 500 mg    05/26/17 1022 Given   hydroxychloroquine (PLAQUENIL) tablet 400 mg 400 mg    05/26/17 1044 New Bag/Given   vancomycin (VANCOCIN) IVPB 750 mg/150 ml premix 750 mg 150 mL/hr   05/26/17 2201 Given   cephALEXin (KEFLEX) capsule 500 mg 500 mg    05/27/17 1023 Given   hydroxychloroquine (PLAQUENIL) tablet 400 mg 400 mg    05/27/17 1023 Given   cephALEXin (KEFLEX) capsule 500 mg 500 mg    05/27/17 1040 New Bag/Given   vancomycin (VANCOCIN) IVPB 750 mg/150 ml premix 750 mg 150 mL/hr     . aspirin EC  81 mg Oral Daily  . atorvastatin  20 mg Oral Daily  . budesonide  2 mL Nebulization Daily  . cephALEXin  500 mg Oral Q12H  . cholecalciferol  1,000 Units Oral Daily  . collagenase   Topical Daily  . enoxaparin (LOVENOX) injection  40 mg Subcutaneous Q24H  . feeding supplement (ENSURE ENLIVE)  237 mL Oral BID BM  . ferrous sulfate  325 mg Oral BID WC  . hydroxychloroquine  400 mg Oral Daily   . loratadine  10 mg Oral Daily  . LORazepam  1 mg Oral QPM  . Melatonin  5 mg Oral QHS  . metaxalone  800 mg Oral TID  . metoprolol succinate  50 mg Oral Daily  . mirtazapine  30 mg Oral QHS  . mometasone-formoterol  2 puff Inhalation BID  . multivitamin with minerals  1 tablet Oral Daily  . pantoprazole  40 mg Oral Daily  . potassium chloride  20 mEq Oral Daily  . rOPINIRole  0.5 mg Oral QHS  . senna-docusate  1 tablet Oral BID  . sodium chloride flush  10-40 mL Intracatheter Q12H    Objective: Vital signs in last 24 hours: Temp:  [98.6 F (37 C)-98.9 F (37.2 C)] 98.7 F (37.1 C) (02/20 1020) Pulse Rate:  [91-101] 101 (02/20 1020) Resp:  [18-19] 18 (02/20 1020) BP: (113-155)/(67-82) 155/77 (02/20 1020) SpO2:  [95 %-98 %] 96 % (02/20 1020) Constitutional:  Linda Walsh, oriented to person, place, and time. Thin, frail, lying on l side in bed HENT: [place]/AT, PERRLA, no scleral icterus Mouth/Throat: Oropharynx is clear and dry . No oropharyngeal exudate.  Cardiovascular: Normal rate, regular rhythm and normal heart sounds. 2/6 sm Pulmonary/Chest: poor air movement,  rhonchi throughoug Neck = supple, no nuchal rigidity Abdominal: Soft. Bowel sounds are normal.  exhibits no distension. There is no tenderness.  Lymphadenopathy: no cervical adenopathy. No axillary adenopathy Neurological: alert and oriented to person, place, and time.  Skin: Skin is warm and dry. R buttock with 2x3 cm irregular shallow ulceration with slough, mild surrounding erythema Psychiatric: very flat affect Ext no cce, has RA changes of hands ttp of l spine    Lab Results Recent Labs    05/26/17 0419 05/27/17 0306  WBC  --  6.8  HGB  --  10.6*  HCT  --  31.0*  NA 126* 132*  K 3.0* 3.9  CL 91* 97*  CO2 25 27  BUN 7 8  CREATININE 0.53 0.39*    Microbiology: Results for orders placed or performed during the hospital encounter of 05/22/17  Urine Culture     Status: Abnormal   Collection Time: 05/22/17  12:19 PM  Result Value Ref Range Status   Specimen Description   Final    URINE, CLEAN CATCH Performed at [facility], [address], [place], [place] [address]    Special Requests   Final    Normal Performed at [facility], [address] [place], [place] [address]    Culture >=100,000 COLONIES/mL KLEBSIELLA PNEUMONIAE (A)  Final   Report Status 05/25/2017 FINAL  Final   Organism ID, Bacteria KLEBSIELLA PNEUMONIAE (A)  Final      Susceptibility   Klebsiella pneumoniae - MIC*    AMPICILLIN >=32 RESISTANT Resistant     CEFAZOLIN <=4 SENSITIVE Sensitive     CEFTRIAXONE <=1 SENSITIVE Sensitive     CIPROFLOXACIN <=0.25 SENSITIVE Sensitive     GENTAMICIN <=1 SENSITIVE Sensitive     IMIPENEM <=0.25 SENSITIVE Sensitive     NITROFURANTOIN 64 INTERMEDIATE Intermediate     TRIMETH/SULFA <=20 SENSITIVE Sensitive     AMPICILLIN/SULBACTAM 4 SENSITIVE Sensitive     PIP/TAZO <=4 SENSITIVE Sensitive     Extended ESBL NEGATIVE Sensitive     * >=100,000 COLONIES/mL KLEBSIELLA PNEUMONIAE  Blood Culture (routine x 2)     Status: Abnormal   Collection Time: 05/22/17  2:13 PM  Result Value Ref Range Status   Specimen Description   Final    BLOOD BLOOD RIGHT HAND Performed at [facility], [address] [place], [place] [address]    Special Requests   Final    BOTTLES DRAWN AEROBIC AND ANAEROBIC Blood Culture results may not be optimal due to an inadequate volume of blood received in culture bottles Performed at [facility], [address] [place], [place] [address]    Culture  Setup Time   Final    GRAM POSITIVE COCCI IN BOTH AEROBIC AND ANAEROBIC BOTTLES CRITICAL RESULT CALLED TO, READ BACK BY AND VERIFIED WITH: Astoria Condon BESANTI AT 6440 05/23/17.PMH Performed at [facility], [place] [address] [place], [place] [address]    Culture METHICILLIN RESISTANT STAPHYLOCOCCUS AUREUS (A)  Final   Report Status 05/25/2017 FINAL  Final   Organism ID,  Bacteria METHICILLIN RESISTANT STAPHYLOCOCCUS AUREUS  Final      Susceptibility   Methicillin resistant staphylococcus aureus - MIC*    CIPROFLOXACIN <=0.5 SENSITIVE Sensitive     ERYTHROMYCIN >=8 RESISTANT Resistant     GENTAMICIN <=0.5 SENSITIVE Sensitive     OXACILLIN >=4 RESISTANT Resistant     TETRACYCLINE <=1 SENSITIVE Sensitive     VANCOMYCIN 1 SENSITIVE Sensitive  TRIMETH/SULFA <=10 SENSITIVE Sensitive     CLINDAMYCIN <=0.25 SENSITIVE Sensitive     RIFAMPIN <=0.5 SENSITIVE Sensitive     Inducible Clindamycin NEGATIVE Sensitive     * METHICILLIN RESISTANT STAPHYLOCOCCUS AUREUS  Blood Culture (routine x 2)     Status: Abnormal   Collection Time: 05/22/17  2:13 PM  Result Value Ref Range Status   Specimen Description   Final    BLOOD BLOOD RIGHT FOREARM Performed at [facility], [address], [place], [place] [address]    Special Requests   Final    BOTTLES DRAWN AEROBIC AND ANAEROBIC Blood Culture adequate volume Performed at [facility], [address] [place], [place] [address]    Culture  Setup Time   Final    GRAM POSITIVE COCCI IN BOTH AEROBIC AND ANAEROBIC BOTTLES CRITICAL RESULT CALLED TO, READ BACK BY AND VERIFIED WITH: Loyalty Arentz BESANTI AT 1761 05/23/17.PMH Performed at [facility], [address] [place], [place] [address]    Culture (A)  Final    STAPHYLOCOCCUS AUREUS SUSCEPTIBILITIES PERFORMED ON PREVIOUS CULTURE WITHIN THE LAST 5 DAYS. Performed at [facility], [place] [address] [place], [place] [address]    Report Status 05/25/2017 FINAL  Final  Blood Culture ID Panel (Reflexed)     Status: Abnormal   Collection Time: 05/22/17  2:13 PM  Result Value Ref Range Status   Enterococcus species NOT DETECTED NOT DETECTED Final   Listeria monocytogenes NOT DETECTED NOT DETECTED Final   Staphylococcus species DETECTED (A) NOT DETECTED Final    Comment: CRITICAL RESULT CALLED TO, READ BACK BY AND VERIFIED WITH: Dionte Blaustein  BESANTI AT 9485 05/23/17.PMH    Staphylococcus aureus DETECTED (A) NOT DETECTED Final    Comment: Methicillin (oxacillin)-resistant Staphylococcus aureus (MRSA). MRSA is predictably resistant to beta-lactam antibiotics (except ceftaroline). Preferred therapy is vancomycin unless clinically contraindicated. Patient requires contact precautions if  hospitalized. CRITICAL RESULT CALLED TO, READ BACK BY AND VERIFIED WITH: Cahterine Heinzel BESANTI AT 4627 05/23/17.PMH    Methicillin resistance DETECTED (A) NOT DETECTED Final    Comment: CRITICAL RESULT CALLED TO, READ BACK BY AND VERIFIED WITH: Ferrel Simington BESANTI AT 0350 05/23/17.PMH    Streptococcus species NOT DETECTED NOT DETECTED Final   Streptococcus agalactiae NOT DETECTED NOT DETECTED Final   Streptococcus pneumoniae NOT DETECTED NOT DETECTED Final   Streptococcus pyogenes NOT DETECTED NOT DETECTED Final   Acinetobacter baumannii NOT DETECTED NOT DETECTED Final   Enterobacteriaceae species NOT DETECTED NOT DETECTED Final   Enterobacter cloacae complex NOT DETECTED NOT DETECTED Final   Escherichia coli NOT DETECTED NOT DETECTED Final   Klebsiella oxytoca NOT DETECTED NOT DETECTED Final   Klebsiella pneumoniae NOT DETECTED NOT DETECTED Final   Proteus species NOT DETECTED NOT DETECTED Final   Serratia marcescens NOT DETECTED NOT DETECTED Final   Haemophilus influenzae NOT DETECTED NOT DETECTED Final   Neisseria meningitidis NOT DETECTED NOT DETECTED Final   Pseudomonas aeruginosa NOT DETECTED NOT DETECTED Final   Candida albicans NOT DETECTED NOT DETECTED Final   Candida glabrata NOT DETECTED NOT DETECTED Final   Candida krusei NOT DETECTED NOT DETECTED Final   Candida parapsilosis NOT DETECTED NOT DETECTED Final   Candida tropicalis NOT DETECTED NOT DETECTED Final    Comment: Performed at [facility], [address] [place], [place] [address]  Culture, blood (Routine X 2) w Reflex to ID Panel     Status: None (Preliminary result)    Collection Time: 05/24/17  9:48 AM  Result Value Ref Range Status   Specimen Description BLOOD LEFT ANTECUBITAL  Final   Special Requests   Final    BOTTLES DRAWN AEROBIC AND ANAEROBIC Blood Culture adequate volume   Culture   Final    NO GROWTH 3 DAYS Performed at [facility], [address], [place], [place] [address]    Report Status PENDING  Incomplete  Culture, blood (Routine X 2) w Reflex to ID Panel     Status: None (Preliminary result)   Collection Time: 05/24/17 10:04 AM  Result Value Ref Range Status   Specimen Description BLOOD BLOOD LEFT HAND  Final   Special Requests   Final    BOTTLES DRAWN AEROBIC AND ANAEROBIC Blood Culture adequate volume   Culture   Final    NO GROWTH 3 DAYS Performed at [facility], [address] [place], [place] [address]    Report Status PENDING  Incomplete     Studies/Results: Mr Lumbar Spine W Wo Contrast  Result Date: 05/27/2017 CLINICAL DATA:  Weakness. Altered mental status. Fall. L1 fracture. Chest mass and adenopathy. EXAM: MRI LUMBAR SPINE WITHOUT AND WITH CONTRAST TECHNIQUE: Multiplanar and multiecho pulse sequences of the lumbar spine were obtained without and with intravenous contrast. CONTRAST:  41m MULTIHANCE GADOBENATE DIMEGLUMINE 529 MG/ML IV SOLN COMPARISON:  CT of the lumbar spine 05/13/2014. CT chest 05/23/2017. FINDINGS: Segmentation: 5 non rib-bearing lumbar type vertebral bodies are present. Alignment: AP alignment is anatomic. Straightening of the normal lumbar lordosis is stable. Vertebrae: A pathologic compression fracture is present at L1. There is enhancing component posteriorly at L1 extending into the left pedicle. Edema is present throughout the vertebral body. A remote superior endplate fractures present at T12. Additional signal abnormality enhancement is present in the sacrum. This suggests a second lesion. No other focal marrow signal abnormality is present to suggest metastatic disease  elsewhere. Conus medullaris and cauda equina: Conus extends to the L1 level. Conus and cauda equina appear normal. Paraspinal and other soft tissues: Heterogeneous collections are now present within the psoas musculature bilaterally. This appears to originate at the L1 level. There is no definite enhancement associated with the collections. These are new since the CT scan. Disc levels: Disc signal is preserved.  Disc infection is considered unlikely. T11-12: Mild disc bulging is present without significant stenosis. T12-L1: Retropulsed bone or soft tissue associated with the L1 compression fracture effaces the ventral CSF with moderate central canal narrowing. Mild foraminal narrowing is present bilaterally. L1-2: Retropulsed bone effaces the ventral CSF above this level. Mild foraminal narrowing is present bilaterally. L2-3: Mild disc bulging is present. There is no significant stenosis. L3-4: Mild disc bulging is present there is no significant stenosis. L4-5: A broad-based disc protrusion is present. Mild left subarticular narrowing is present. Foramina are patent bilaterally. L5-S1: A leftward disc protrusion is present without significant stenosis. IMPRESSION: 1. Enhancing lesion within the posterior aspect of the L1 fracture with extraosseous extension suggesting metastatic disease and pathologic fracture. 2. Focal signal abnormality and enhancement at S3 likely also represents focal metastasis. 3. New mixed intensity collections within the psoas musculature bilaterally compatible with hemorrhage. Infection is considered unlikely. 4. Retropulsed bone narrows the spinal canal with mild central canal stenosis at L1. 5. Mild foraminal narrowing bilaterally at T12-L1 and L1-2. Electronically Signed   By: CSan MorelleM.D.   On: 05/27/2017 13:31    Assessment/Plan: AMiabella Shannahanis a 77y.o. female with RA, recent admission for fall and L1 fx,  recent influenza now with MRSA bacteremia and imaging showing  possible RUL mass vs abscess. Has Klebsiella UTI. TTE negative. She has FTT and a R buttocks wound which could be a source of the bacteremia.  FU bcx 2/17 ngtd.  I had initially discussed with her and her son the seriousness of MRSA bacteremia. Discussed need for minimum of 2 weeks IV abx therapy, as well as further evaluation for endocarditis with TEE. Will also need further work up for the RUL mass.  Her son however would like to have her be stronger prior to undergoing the TEE or bronch and I think it is ok to hold on the TEE - can do as otpt if needed.  It would not change the management until we come to the 2 week treatment mark. However I did ask him to consider not delaying the Bronch as if there is a malignancy it will need to be addressed ASAP.   Feb 19 - esr > 140 and crp 20.6 No family at bedside. Feb 20 -remains AF, fu bcx 2/17 neg. Picc placed. 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  Recommendations Cont vanco for 2 weeks total from 2/17 if fu bcx negative Cont  keflex for the UTI for a total 7 days- stop date 05/28/17 Query if can bxp the L spine lesion instead of lung mass.  I discussed extensively with the son. He is agreeable to spine bxp but is very hesitant about a bronchoscopy given her weakened state. He is hoping she will get stronger but I told him if we do not find out what this is now she will only continue to decline. Consider formal onc consult. Thank you very much for the consult. Will follow with you.  Leonel Ramsay   05/27/2017, 1:48 PM

## 2017-05-27 NOTE — Progress Notes (Signed)
Pharmacy Antibiotic Note  Linda Walsh is a 77 y.o. female admitted on 05/22/2017 with MRSA bacteremia.  Pharmacy has been consulted for vancomycin dosing.  Currently ordered Vancomycin 750mg  IV q24h. 2/20 10:00 Vancomycin trough resulted at 58mcg/ml  Goal trough is 15-20 mcg/ml  Plan: Will increase to Vancomycin 750mg  IV q12h. Recheck trough level prior to 5th dose.  Height: 4\' 8"  (142.2 cm) Weight: 105 lb (47.6 kg) IBW/kg (Calculated) : 36.3  Temp (24hrs), Avg:98.7 F (37.1 C), Min:98.6 F (37 C), Max:98.9 F (37.2 C)  Recent Labs  Lab 05/22/17 1249 05/22/17 1414 05/23/17 0331 05/24/17 0348 05/25/17 0242 05/26/17 0419 05/27/17 0306 05/27/17 1004  WBC 12.0*  --  11.1*  --   --   --  6.8  --   CREATININE 0.47  --  0.35* 0.38* 0.31* 0.53 0.39*  --   LATICACIDVEN  --  1.3  --   --   --   --   --   --   VANCOTROUGH  --   --   --   --   --   --   --  6*    Estimated Creatinine Clearance: 38.5 mL/min (A) (by C-G formula based on SCr of 0.39 mg/dL (L)).    No Known Allergies  Antimicrobials this admission: vancomycin 2/17 >>  ceftriaxone 2/15 >> 2/16  Dose adjustments this admission: 2/20 increased to 750mg  IV q12h  Microbiology results: 2/15 BCx: 4/4 MRSA  2/15 UCx: Sent   Thank you for allowing pharmacy to be a part of this patient's care.  Paulina Fusi, PharmD, BCPS 05/27/2017 2:11 PM

## 2017-05-27 NOTE — Progress Notes (Signed)
ELECTROLYTE CONSULT NOTE - INITIAL   Pharmacy Consult for electrolyte monitoring/replacement Indication: hypokalemia  No Known Allergies  Patient Measurements: Height: 4\' 8"  (142.2 cm) Weight: 105 lb (47.6 kg) IBW/kg (Calculated) : 36.3  Vital Signs: Temp: 98.7 F (37.1 C) (02/20 1020) Temp Source: Oral (02/20 1020) BP: 155/77 (02/20 1020) Pulse Rate: 101 (02/20 1020) Intake/Output from previous day: 02/19 0701 - 02/20 0700 In: 150 [IV Piggyback:150] Out: -  Intake/Output from this shift: No intake/output data recorded.  Labs: Recent Labs    05/25/17 0242 05/26/17 0419 05/27/17 0306  WBC  --   --  6.8  HGB  --   --  10.6*  HCT  --   --  31.0*  PLT  --   --  424  CREATININE 0.31* 0.53 0.39*  MG 1.5* 2.3 1.9  PHOS 2.9  --   --    Estimated Creatinine Clearance: 38.5 mL/min (A) (by C-G formula based on SCr of 0.39 mg/dL (L)).  Assessment: Pharmacy consulted to assist with electrolyte monitoring/replacement in this 77 year old female admitted with UTI, MRSA bacteremia, and FTT.  Goal of Therapy: Electrolytes WNL  Plan: Electrolytes within range, will continue to follow.  Paulina Fusi, PharmD, BCPS 05/27/2017 2:07 PM

## 2017-05-27 NOTE — Progress Notes (Addendum)
[facility] [place] NAME: Linda Walsh    MR#:  [id number]  DATE OF BIRTH:  07-19-1940  SUBJECTIVE: Admitted for failure to thrive, weakness and found to have UTI, hypokalemia.  Patient found to have MRSA bacteremia,.  To have possible bronchogenic carcinoma.  Continues to have poor p.o. intake complains of back pain, pleuritic chest pain, patient scheduled for MRI of the spine.  CHIEF COMPLAINT:   Chief Complaint  Patient presents with  . Failure To Thrive    REVIEW OF SYSTEMS:   ROS CONSTITUTIONAL: Cough, complains of back pain EYES: No blurred or double vision.  EARS, NOSE, AND THROAT: No tinnitus or ear pain.  RESPIRATORY: Cough,  No shortness of breath, wheezing or hemoptysis.  CARDIOVASCULAR: No chest pain, orthopnea, edema.  GASTROINTESTINAL: No nausea, vomiting, diarrhea or abdominal pain.  Poor p.o. intake GENITOURINARY: No dysuria, hematuria.  ENDOCRINE: No polyuria, nocturia,  HEMATOLOGY: No anemia, easy bruising or bleeding SKIN: Small decubiti on the right buttock MUSCULOSKELETAL: Diffuse joint pains NEUROLOGIC: No tingling, numbness, weakness.  PSYCHIATRY: No anxiety or depression.   DRUG ALLERGIES:  No Known Allergies  VITALS:  Blood pressure (!) 155/77, pulse (!) 101, temperature 98.7 F (37.1 C), temperature source Oral, resp. rate 18, height 4\' 8"  (1.422 m), weight 47.6 kg (105 lb), SpO2 96 %.  PHYSICAL EXAMINATION:  GENERAL:  77 y.o.-year-old patient lying in the bed with no acute distress.  Very  Frail individual EYES: Pupils equal, round, reactive to light.No scleral icterus. Extraocular muscles intact.  HEENT: Head atraumatic, normocephalic. Oropharynx and nasopharynx clear.  NECK:  Supple, no jugular venous distention. No thyroid enlargement, no tenderness.  LUNGS: Normal breath sounds bilaterally, no wheezing, rales,rhonchi or crepitation. No use of accessory muscles of respiration.  CARDIOVASCULAR:  S1, S2 tachycardic.Marland Kitchen No murmurs, rubs, or gallops.  ABDOMEN: Soft, nontender, nondistended. Bowel sounds present. No organomegaly or mass.  EXTREMITIES: No pedal edema, cyanosis, or clubbing.  NEUROLOGIC: Cranial nerves II through XII are intact. Muscle strength 5/5 in all extremities. Sensation intact. Gait not checked.  PSYCHIATRIC: The patient is alert and oriented x 3.  SKIN: Sacral decubiti on the right buttock with some eschar in the center, slight pus around the periphery but patient has no tenderness the size around 3-2 cm.  Consult wound care for staging, dressing recommendations.   LABORATORY PANEL:   CBC Recent Labs  Lab 05/27/17 0306  WBC 6.8  HGB 10.6*  HCT 31.0*  PLT 424   ------------------------------------------------------------------------------------------------------------------  Chemistries  Recent Labs  Lab 05/27/17 0306  NA 132*  K 3.9  CL 97*  CO2 27  GLUCOSE 96  BUN 8  CREATININE 0.39*  CALCIUM 7.7*  MG 1.9   ------------------------------------------------------------------------------------------------------------------  Cardiac Enzymes Recent Labs  Lab 05/22/17 1249  TROPONINI 0.03*   ------------------------------------------------------------------------------------------------------------------  RADIOLOGY:  No results found.  EKG:   Orders placed or performed during the hospital encounter of 05/22/17  . ED EKG  . ED EKG  . EKG 12-Lead  . EKG 12-Lead  . EKG    ASSESSMENT AND PLAN:  77 year old female with past medical history of rheumatoid arthritis, recent L1 fracture, right clavicle fracture managed nonoperatively and also has asthma, anxiety, restless leg syndrome comes from group home because of generalized weakness, poor p.o. intake and found to have severe hypokalemia, UTI. #1 MRSA bacteremia: Patient is on vancomycin, repeat blood cultures have been negative for 48 hrs. from February 17.  Patient did get  a PICC line for  2 weeks of antibiotics  With  vancomycin.   echocardiogram shows EF around 55-60% without any vegetations.  Further workup, CT chest is done that did not show any PE but showed possible bronchogenic carcinoma on the right side.  Spoke with Dr. Devona Konig, patient needs EBUS.Marland Kitchen Continue vancomycin.   Patient is vancomycin for 2 weeks from 2/17.  patient's son to consider bronchoscopy for evaluation of possible right upper lobe mass./Abscess  2.  Severe hypokalemia secondary to poor p.o. intake.  Pharmacy consulted for  ELECTROLYTE  management.  Hypokalemia improving. Continue IV hydration  3.  Generalized weakness: Get physical therapy consulted 4.  Hyperlipidemia: Continue atorvastatin. 5.  Asthma: No wheezing.  But does have cough.  Continue bronchodilators, Tussionex.,  Further workup for possible bronchogenic carcinoma/abcess including pulmonary consult, oncology consult  6.  History of rheumatoid arthritis: Continue Plaquenil. 7.  Restless leg syndrome: Continue Requip. Essential hypertension and now has tachycardia continue Toprol-XL. #8/. pressure ulcer on the right gluteal region: C wound care consulted, recommended daily dressing changes a with Santyl. #9. Klebsiella UTI: Patient is on Rocephin.  changed to Keflex stop date the 21st.  10.low back pain;possible discitis; MRI LS spine ordered by ID,folow result. Prognosis poor secondary to multiple medical problems, poor p.o. intake, possible lung cancer with MRSA bacteremia now.  I paged Dr. Lanice Schwab: Regarding bronchoscopy timing but no response yet.  Appreciate pulmonary following,   All the records are reviewed and case discussed with Care Management/Social Workerr. Management plans discussed with the patient, family and they are in agreement.  CODE STATUS: Full code  TOTAL TIME TAKING CARE OF THIS PATIENT: 35 minutes.   POSSIBLE D/C IN 1-2 DAYS, DEPENDING ON CLINICAL CONDITION.   Epifanio Lesches M.D on 05/27/2017 at 11:55  AM  Between 7am to 6pm - Pager - [phone]  After 6pm go to [email or web address] - password [email or web address] [facility]  Office  [phone]  CC: Primary care physician; Remi Haggard, FNP   Note: This dictation was prepared with Dragon dictation along with smaller phrase technology. Any transcriptional errors that result from this process are unintentional.

## 2017-05-27 NOTE — Progress Notes (Signed)
Peripherally Inserted Central Catheter/Midline Placement  The IV Nurse has discussed with the patient and/or persons authorized to consent for the patient, the purpose of this procedure and the potential benefits and risks involved with this procedure.  The benefits include less needle sticks, lab draws from the catheter, and the patient may be discharged home with the catheter. Risks include, but not limited to, infection, bleeding, blood clot (thrombus formation), and puncture of an artery; nerve damage and irregular heartbeat and possibility to perform a PICC exchange if needed/ordered by physician.  Alternatives to this procedure were also discussed.  Bard Power PICC patient education guide, fact sheet on infection prevention and patient information card has been provided to patient /or left at bedside.    PICC/Midline Placement Documentation     Consent obtained by son via telephone   Holley Bouche [facility] 05/27/2017, 9:24 AM

## 2017-05-28 DIAGNOSIS — R591 Generalized enlarged lymph nodes: Secondary | ICD-10-CM

## 2017-05-28 DIAGNOSIS — R7881 Bacteremia: Secondary | ICD-10-CM

## 2017-05-28 DIAGNOSIS — A4902 Methicillin resistant Staphylococcus aureus infection, unspecified site: Secondary | ICD-10-CM

## 2017-05-28 DIAGNOSIS — M545 Low back pain: Secondary | ICD-10-CM

## 2017-05-28 DIAGNOSIS — I509 Heart failure, unspecified: Secondary | ICD-10-CM

## 2017-05-28 DIAGNOSIS — Z7189 Other specified counseling: Secondary | ICD-10-CM

## 2017-05-28 DIAGNOSIS — M069 Rheumatoid arthritis, unspecified: Secondary | ICD-10-CM

## 2017-05-28 DIAGNOSIS — W19XXXA Unspecified fall, initial encounter: Secondary | ICD-10-CM

## 2017-05-28 DIAGNOSIS — R918 Other nonspecific abnormal finding of lung field: Secondary | ICD-10-CM

## 2017-05-28 DIAGNOSIS — Z515 Encounter for palliative care: Secondary | ICD-10-CM

## 2017-05-28 DIAGNOSIS — S32019A Unspecified fracture of first lumbar vertebra, initial encounter for closed fracture: Secondary | ICD-10-CM

## 2017-05-28 LAB — BASIC METABOLIC PANEL
Anion gap: 10 (ref 5–15)
BUN: 8 mg/dL (ref 6–20)
CHLORIDE: 94 mmol/L — AB (ref 101–111)
CO2: 26 mmol/L (ref 22–32)
Calcium: 8 mg/dL — ABNORMAL LOW (ref 8.9–10.3)
Creatinine, Ser: 0.37 mg/dL — ABNORMAL LOW (ref 0.44–1.00)
GFR calc Af Amer: >60 mL/min (ref >60)
GLUCOSE: 128 mg/dL — AB (ref 65–99)
POTASSIUM: 3.8 mmol/L (ref 3.5–5.1)
Sodium: 130 mmol/L — ABNORMAL LOW (ref 135–145)

## 2017-05-28 MED ORDER — MORPHINE SULFATE (PF) 2 MG/ML IV SOLN
1.0000 mg | INTRAVENOUS | Status: DC | PRN
  Administered 2017-05-28 – 2017-05-29 (×7): 1 mg via INTRAVENOUS
  Filled 2017-05-28 (×7): qty 1

## 2017-05-28 MED ORDER — MORPHINE SULFATE (PF) 2 MG/ML IV SOLN
INTRAVENOUS | Status: AC
  Filled 2017-05-28: qty 1

## 2017-05-28 MED ORDER — MORPHINE SULFATE (PF) 2 MG/ML IV SOLN
2.0000 mg | Freq: Once | INTRAVENOUS | Status: AC
  Administered 2017-05-28: 2 mg via INTRAVENOUS

## 2017-05-28 NOTE — H&P (Signed)
[place] [facility] [place] NAME: Linda Walsh    MR#:  [id number]  DATE OF BIRTH:  1940/09/15  DATE OF ADMISSION:  05/22/2017  PRIMARY CARE PHYSICIAN: Remi Haggard, FNP   REQUESTING/REFERRING PHYSICIAN: Dr. Delman Kitten  CHIEF COMPLAINT:   Chief Complaint  Patient presents with  . Failure To Thrive  Weakness.   HISTORY OF PRESENT ILLNESS:  Linda Walsh  is a 77 y.o. female with a known history of Takasubo cardiomyopathy, rheumatoid arthritis, depression who presents to the hospital from a group home due to weakness, lethargy, poor by mouth intake.   Patient was recently in the hospital related to asthma exacerbation due to flu and also noted to have a L1 fracture, displaced fracture of the right clavicle which was managed nonoperatively.   Admitted for failure to thrive with  urinary tract infection also noted to be hypokalemic. Patient found to have MRSA bacteremia  Has Klebsiella UTI. TTE negative. She has FTT and a R buttocks wound which could be a source of the bacteremia.  FU bcx 2/17 ngtd    Ct chest and MRI reports and images reviewed in Thoracic Conference Patient is still complaining of significant lower back pain, but denies any nausea vomiting, fevers chills, cough or congestion.  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 Arthritis   . CHF (congestive heart failure) ([place])   . Depressed   . MRSA bacteremia 05/23/2017  . Renal disorder     PAST SURGICAL HISTORY:  History reviewed. No pertinent surgical history.  SOCIAL HISTORY:   Social History   Tobacco Use  . Smoking status: Never Smoker  . Smokeless tobacco: Never Used  Substance Use Topics  . Alcohol use: No    FAMILY HISTORY:   Family History  Problem Relation Age of Onset  . Heart disease Father     DRUG ALLERGIES:  No Known  Allergies  REVIEW OF SYSTEMS:   Review of Systems  Constitutional: Negative for chills, fever and weight loss.  HENT: Negative for congestion, nosebleeds and tinnitus.   Eyes: Negative for blurred vision, double vision and redness.  Respiratory: Negative for cough, hemoptysis, shortness of breath and wheezing.   Cardiovascular: Negative for chest pain, orthopnea, leg swelling and PND.  Gastrointestinal: Negative for abdominal pain, diarrhea, melena, nausea and vomiting.  Genitourinary: Negative for dysuria, hematuria and urgency.  Musculoskeletal: Positive for falls. Negative for joint pain.  Neurological: Positive for weakness. Negative for dizziness, tingling, sensory change, focal weakness, seizures and headaches.  Endo/Heme/Allergies: Negative for polydipsia. Does not bruise/bleed easily.  Psychiatric/Behavioral: Negative for depression and memory loss. The patient is not nervous/anxious.   All other systems reviewed and are negative.   MEDICATIONS AT HOME:   Prior to Admission medications   Medication Sig Start Date End Date Taking? Authorizing Provider  acetaminophen (TYLENOL) 500 MG tablet Take 500 mg by mouth 2 (two) times daily. 10/24/09  Yes [provider]  aspirin EC 81 MG tablet Take 81 mg by mouth daily.   Yes [provider]  atorvastatin (LIPITOR) 20 MG tablet Take 20 mg by mouth daily. 10/27/13  Yes [provider]  budesonide (PULMICORT) 0.5 MG/2ML nebulizer solution Take 2 mLs by nebulization daily. 09/22/16  Yes [provider]  cetirizine (ZYRTEC) 10 MG tablet Take 10 mg by mouth daily.  Yes [provider]  Cholecalciferol (VITAMIN D-1000 MAX ST) 1000 units tablet Take 1,000 mg by mouth daily. 06/21/15  Yes [provider]  feeding supplement, ENSURE ENLIVE, (ENSURE ENLIVE) LIQD Take 237 mLs by mouth 2 (two) times daily between meals. 05/15/17  Yes Mody, Ulice Bold, MD  ferrous sulfate 325 (65 FE) MG tablet Take 325 mg by  mouth 2 (two) times daily.   Yes [provider]  Fluticasone-Salmeterol (ADVAIR) 250-50 MCG/DOSE AEPB Inhale 1 puff into the lungs 2 (two) times daily.   Yes [provider]  hydroxychloroquine (PLAQUENIL) 200 MG tablet Take 400 mg by mouth daily.  01/18/16  Yes [provider]  LORazepam (ATIVAN) 1 MG tablet Take 1 mg by mouth every evening.    Yes [provider]  metoprolol succinate (TOPROL-XL) 25 MG 24 hr tablet Take 25 mg by mouth daily.   Yes [provider]  mirtazapine (REMERON) 30 MG tablet Take 30 mg by mouth at bedtime.   Yes [provider]  mupirocin ointment (BACTROBAN) 2 % Place 1 application into the nose 2 (two) times daily. 05/15/17  Yes Mody, Ulice Bold, MD  pantoprazole (PROTONIX) 40 MG tablet Take 1 tablet by mouth daily. 05/12/17  Yes [provider]  PROAIR HFA 108 (90 Base) MCG/ACT inhaler Inhale 2 puffs into the lungs every 4 (four) hours as needed. 05/12/17  Yes [provider]  rOPINIRole (REQUIP) 0.5 MG tablet Take 0.5 mg by mouth at bedtime.   Yes [provider]  senna-docusate (SENOKOT-S) 8.6-50 MG tablet Take 1 tablet by mouth 2 (two) times daily.   Yes [provider]  Suvorexant (BELSOMRA) 15 MG TABS Take 15 mg by mouth at bedtime.    Yes [provider]  traMADol (ULTRAM) 50 MG tablet Take 1 tablet (50 mg total) by mouth every 6 (six) hours as needed. 05/15/17 05/15/18 Yes Mody, Ulice Bold, MD  calcium-vitamin D (OSCAL WITH D) 500-200 MG-UNIT tablet Take 1 tablet by mouth 2 (two) times daily. Patient not taking: Reported on 05/22/2017 05/15/17   Bettey Costa, MD      VITAL SIGNS:  Blood pressure (!) 157/82, pulse (!) 108, temperature 98.7 F (37.1 C), temperature source Oral, resp. rate 18, height 4\' 8"  (1.422 m), weight 105 lb (47.6 kg), SpO2 96 %.  PHYSICAL EXAMINATION:  Physical Exam  GENERAL:  77 y.o.-year-old patient lying in the bed lethargic but in no acute distress. EYES:  Pupils equal, round, reactive to light and accommodation. No scleral icterus. Extraocular muscles intact.  HEENT: Head atraumatic, normocephalic. Oropharynx and nasopharynx clear. No oropharyngeal erythema, moist oral mucosa  NECK:  Supple, no jugular venous distention. No thyroid enlargement, no tenderness.  LUNGS: Poor respiratory effort, no wheezing, rales, rhonchi. No use of accessory muscles of respiration.  CARDIOVASCULAR: S1, S2 RRR. No murmurs, rubs, gallops, clicks.  ABDOMEN: Soft, nontender, nondistended. Bowel sounds present. No organomegaly or mass.  EXTREMITIES: No pedal edema, cyanosis, or clubbing. + 2 pedal & radial pulses b/l.  Swan neck deformities NEUROLOGIC: Cranial nerves II through XII are intact. No focal Motor or sensory deficits appreciated b/l. Globally weak PSYCHIATRIC: The patient is alert and oriented x 3.  SKIN: No obvious rash, lesion, or ulcer.   LABORATORY PANEL:   CBC Recent Labs  Lab 05/27/17 0306  WBC 6.8  HGB 10.6*  HCT 31.0*  PLT 424   ------------------------------------------------------------------------------------------------------------------  Chemistries  Recent Labs  Lab 05/27/17 0306 05/28/17 0303  NA 132* 130*  K 3.9  3.8  CL 97* 94*  CO2 27 26  GLUCOSE 96 128*  BUN 8 8  CREATININE 0.39* 0.37*  CALCIUM 7.7* 8.0*  MG 1.9  --    ------------------------------------------------------------------------------------------------------------------  Cardiac Enzymes Recent Labs  Lab 05/22/17 1249  TROPONINI 0.03*   ------------------------------------------------------------------------------------------------------------------  RADIOLOGY:  CT chest 2.16.19 RUL mass with nodular densities Strong suggestion for primary malignancy  IMPRESSION AND PLAN:   77 year old female with past medical history of rheumatoid arthritis, recent admission for a fall with L1 fracture and also a right clavicular fracture which was managed  nonoperatively given her comorbidities, asthma, anxiety, restless leg syndrome who presents to the hospital due to weakness, poor by mouth intake and noted to have urinary tract infection and also noted to have severe hypokalemia now with 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  After further assessment, patient is frail and very week, with malnutrition She is a very high risk for pulmonary and cardiac complications if she were to undergo bronchoscopy.  We can revisit idea of bronchoscopy after her MRSA infection has resolved, she will also need cardiac clearance and Anesthesia clearance to under go Endobronchial [place].  Hemo Onc with obtain PET scan if possible to see if there any other lesions amendable for biospy    At this time, recommend complete DNR/DNI status, her prognosis is poor May even need to consider Hospice   Will discuss with family when avaiable  CODE STATUS: LIMITED   Naren Benally Patricia Pesa, M.D.  [facility]  Medical Director [place] Director [facility] Cardio-Pulmonary Department

## 2017-05-28 NOTE — Progress Notes (Signed)
ELECTROLYTE CONSULT NOTE   Pharmacy Consult for electrolyte monitoring/replacement Indication: hypokalemia  No Known Allergies  Patient Measurements: Height: 4\' 8"  (142.2 cm) Weight: 105 lb (47.6 kg) IBW/kg (Calculated) : 36.3  Vital Signs: Temp: 98.7 F (37.1 C) (02/21 0854) Temp Source: Oral (02/21 0854) BP: 157/82 (02/21 0854) Pulse Rate: 108 (02/21 0854) Intake/Output from previous day: No intake/output data recorded. Intake/Output from this shift: Total I/O In: 40 [I.V.:40] Out: -   Labs: Recent Labs    05/26/17 0419 05/27/17 0306 05/28/17 0303  WBC  --  6.8  --   HGB  --  10.6*  --   HCT  --  31.0*  --   PLT  --  424  --   CREATININE 0.53 0.39* 0.37*  MG 2.3 1.9  --    Estimated Creatinine Clearance: 38.5 mL/min (A) (by C-G formula based on SCr of 0.37 mg/dL (L)).  Assessment: Pharmacy consulted to assist with electrolyte monitoring/replacement in this 77 year old female admitted with UTI, MRSA bacteremia, and FTT.  Goal of Therapy: Electrolytes WNL  Plan: Electrolytes within range, will continue to follow.  Paulina Fusi, PharmD, BCPS 05/28/2017 10:15 AM

## 2017-05-28 NOTE — Consult Note (Signed)
[facility] NOTE  Patient Care Team: Remi Haggard, FNP as PCP - General (Family Medicine)  CHIEF COMPLAINTS/PURPOSE OF CONSULTATION:  Lung mass/lumbar lesion  HISTORY OF PRESENTING ILLNESS: Patient is a poor historian.  No family by the bedside. Linda Walsh 77 y.o.  female currently admitted the hospital for fall.  During the admission - patient noted to have MRSA bacteremia-patient is currently on vancomycin.  As per the ID workup patient had-MRI of the lower spine that showed a pathologic fracture at L1/associated soft tissue mass.  Patient also noted to have a right upper lobe lung mass-this is highly suspicious of malignancy.  Pulmonary has been consulted however given bacteremia/sepsis-and overall given frail status biopsy is currently on hold.  As per the son over the phone-patient lives in a group home; states that she is able to take care of herself in the group home.  Patient has never been a smoker.  She does have significant arthritis bilateral upper lower extremities.  As per the son, walks by herself however more recently has been falling down.  Poor appetite.  Possible weight loss.  ROS: A complete 10 point review of system is done which is negative except mentioned above in history of present illness  MEDICAL HISTORY:  Past Medical History:  Diagnosis Date  . Arthritis   . CHF (congestive heart failure) ([place])   . Depressed   . MRSA bacteremia 05/23/2017  . Renal disorder     SURGICAL HISTORY: History reviewed. No pertinent surgical history.  SOCIAL HISTORY: Social History   Socioeconomic History  . Marital status: Widowed    Spouse name: Not on file  . Number of children: Not on file  . Years of education: Not on file  . Highest education level: Not on file  Social Needs  . Financial resource strain: Not on file  . Food insecurity - worry: Not on file  . Food insecurity - inability: Not on file  . Transportation needs - medical: Not  on file  . Transportation needs - non-medical: Not on file  Occupational History  . Not on file  Tobacco Use  . Smoking status: Never Smoker  . Smokeless tobacco: Never Used  Substance and Sexual Activity  . Alcohol use: No  . Drug use: No  . Sexual activity: Not on file  Other Topics Concern  . Not on file  Social History Narrative  . Not on file    FAMILY HISTORY: Family History  Problem Relation Age of Onset  . Heart disease Father     ALLERGIES:  has No Known Allergies.  MEDICATIONS:  Current Facility-Administered Medications  Medication Dose Route Frequency Provider Last Rate Last Dose  . acetaminophen (TYLENOL) tablet 650 mg  650 mg Oral Q6H PRN Henreitta Leber, MD   650 mg at 05/26/17 1521   Or  . acetaminophen (TYLENOL) suppository 650 mg  650 mg Rectal Q6H PRN Sainani, Belia Heman, MD      . albuterol (PROVENTIL) (2.5 MG/3ML) 0.083% nebulizer solution 3 mL  3 mL Inhalation Q4H PRN Henreitta Leber, MD      . aspirin EC tablet 81 mg  81 mg Oral Daily Henreitta Leber, MD   81 mg at 05/28/17 0857  . atorvastatin (LIPITOR) tablet 20 mg  20 mg Oral Daily Henreitta Leber, MD   20 mg at 05/27/17 1736  . budesonide (PULMICORT) nebulizer solution 0.5 mg  2 mL Nebulization Daily Henreitta Leber, MD  0.5 mg at 05/27/17 2049  . cholecalciferol (VITAMIN D) tablet 1,000 Units  1,000 Units Oral Daily Henreitta Leber, MD   1,000 Units at 05/28/17 0857  . collagenase (SANTYL) ointment   Topical Daily Epifanio Lesches, MD      . enoxaparin (LOVENOX) injection 40 mg  40 mg Subcutaneous Q24H Henreitta Leber, MD   40 mg at 05/27/17 2155  . feeding supplement (ENSURE ENLIVE) (ENSURE ENLIVE) liquid 237 mL  237 mL Oral BID BM Henreitta Leber, MD   237 mL at 05/28/17 1549  . ferrous sulfate tablet 325 mg  325 mg Oral BID WC Henreitta Leber, MD   325 mg at 05/28/17 0857  . guaiFENesin-dextromethorphan (ROBITUSSIN DM) 100-10 MG/5ML syrup 5 mL  5 mL Oral Q4H PRN Henreitta Leber, MD   5  mL at 05/22/17 1748  . hydroxychloroquine (PLAQUENIL) tablet 400 mg  400 mg Oral Daily Henreitta Leber, MD   400 mg at 05/28/17 0858  . loratadine (CLARITIN) tablet 10 mg  10 mg Oral Daily Henreitta Leber, MD   10 mg at 05/28/17 0857  . LORazepam (ATIVAN) tablet 1 mg  1 mg Oral QPM Henreitta Leber, MD   1 mg at 05/27/17 1736  . Melatonin TABS 5 mg  5 mg Oral QHS Henreitta Leber, MD   5 mg at 05/27/17 2159  . metaxalone (SKELAXIN) tablet 800 mg  800 mg Oral TID Epifanio Lesches, MD   800 mg at 05/28/17 0858  . metoprolol succinate (TOPROL-XL) 24 hr tablet 50 mg  50 mg Oral Daily Epifanio Lesches, MD   50 mg at 05/28/17 0857  . mirtazapine (REMERON) tablet 30 mg  30 mg Oral QHS Henreitta Leber, MD   30 mg at 05/27/17 2155  . mometasone-formoterol (DULERA) 200-5 MCG/ACT inhaler 2 puff  2 puff Inhalation BID Henreitta Leber, MD   2 puff at 05/28/17 0859  . morphine 2 MG/ML injection 1 mg  1 mg Intravenous Q2H PRN Asencion Gowda, NP   1 mg at 05/28/17 1354  . morphine 2 MG/ML injection           . multivitamin with minerals tablet 1 tablet  1 tablet Oral Daily Epifanio Lesches, MD   1 tablet at 05/28/17 0856  . ondansetron (ZOFRAN) tablet 4 mg  4 mg Oral Q6H PRN Henreitta Leber, MD       Or  . ondansetron (ZOFRAN) injection 4 mg  4 mg Intravenous Q6H PRN Henreitta Leber, MD   4 mg at 05/25/17 1445  . pantoprazole (PROTONIX) EC tablet 40 mg  40 mg Oral Daily Henreitta Leber, MD   40 mg at 05/28/17 0856  . potassium chloride SA (K-DUR,KLOR-CON) CR tablet 20 mEq  20 mEq Oral Daily Lenis Noon, RPH   20 mEq at 05/28/17 0857  . rOPINIRole (REQUIP) tablet 0.5 mg  0.5 mg Oral QHS Henreitta Leber, MD   0.5 mg at 05/27/17 2156  . senna-docusate (Senokot-S) tablet 1 tablet  1 tablet Oral BID Henreitta Leber, MD   1 tablet at 05/28/17 0858  . sodium chloride flush (NS) 0.9 % injection 10-40 mL  10-40 mL Intracatheter Q12H Epifanio Lesches, MD   10 mL at 05/28/17 0900  . sodium  chloride flush (NS) 0.9 % injection 10-40 mL  10-40 mL Intracatheter PRN Epifanio Lesches, MD      . traMADol Veatrice Bourbon) tablet 50 mg  50 mg  Oral Q6H PRN Henreitta Leber, MD   50 mg at 05/28/17 0858  . vancomycin (VANCOCIN) IVPB 750 mg/150 ml premix  750 mg Intravenous Q12H Vira Blanco, RPH 150 mL/hr at 05/28/17 1046 750 mg at 05/28/17 1046      .  PHYSICAL EXAMINATION:  Vitals:   05/28/17 0854 05/28/17 1630  BP: (!) 157/82 (!) 116/54  Pulse: (!) 108 95  Resp: 18 18  Temp: 98.7 F (37.1 C) 98.2 F (36.8 C)  SpO2: 96% 97%   Filed Weights   05/22/17 1244  Weight: 105 lb (47.6 kg)    GENERAL: Frail cachectic appearing female patient alert, no distress and comfortable.   Around. EYES: no pallor or icterus OROPHARYNX: no thrush or ulceration. NECK: supple, no masses felt LYMPH:  no palpable lymphadenopathy in the cervical, axillary or inguinal regions LUNGS: decreased breath sounds to auscultation at bases and  No wheeze or crackles HEART/CVS: regular rate & rhythm and no murmurs; No lower extremity edema ABDOMEN: abdomen soft, non-tender and normal bowel sounds Musculoskeletal:no cyanosis of digits and no clubbing  PSYCH: alert & oriented x 3 with fluent speech NEURO: no focal motor/sensory deficits SKIN:  no rashes or significant lesions  LABORATORY DATA:  I have reviewed the data as listed Lab Results  Component Value Date   WBC 6.8 05/27/2017   HGB 10.6 (L) 05/27/2017   HCT 31.0 (L) 05/27/2017   MCV 90.9 05/27/2017   PLT 424 05/27/2017   Recent Labs    05/13/17 1123  05/26/17 0419 05/27/17 0306 05/28/17 0303  NA 124*   < > 126* 132* 130*  K 3.0*   < > 3.0* 3.9 3.8  CL 86*   < > 91* 97* 94*  CO2 25   < > 25 27 26   GLUCOSE 150*   < > 137* 96 128*  BUN 12   < > 7 8 8   CREATININE 0.74   < > 0.53 0.39* 0.37*  CALCIUM 8.8*   < > 7.6* 7.7* 8.0*  GFRNONAA >60   < > >60 >60 >60  GFRAA >60   < > >60 >60 >60  PROT 7.9  --   --   --   --   ALBUMIN 3.8  --   --    --   --   AST 45*  --   --   --   --   ALT 18  --   --   --   --   ALKPHOS 65  --   --   --   --   BILITOT 1.0  --   --   --   --    < > = values in this interval not displayed.    RADIOGRAPHIC STUDIES: I have personally reviewed the radiological images as listed and agreed with the findings in the report. Dg Chest 2 View  Result Date: 05/22/2017 CLINICAL DATA:  Fatigue, recent flu EXAM: CHEST  2 VIEW COMPARISON:  05/13/2017 FINDINGS: Negative for heart failure. Lungs are clear without infiltrate or effusion. Atherosclerotic aorta. Normal heart size. L1 compression fracture unchanged. IMPRESSION: No active cardiopulmonary disease. Electronically Signed   By: Franchot Gallo M.D.   On: 05/22/2017 13:28   Dg Chest 2 View  Result Date: 05/13/2017 CLINICAL DATA:  Cough, recent fall EXAM: CHEST  2 VIEW COMPARISON:  Chest x-ray of 05/12/2017 FINDINGS: The lungs remain somewhat hyperaerated but clear. No pneumonia or effusion is seen. Mediastinal hilar contours are  unremarkable and the heart is within normal limits in size. The bones are osteopenic. There are diffuse degenerative changes in the shoulders. The right distal clavicle fracture with separation of the right AC joint is not as well seen by chest x-ray. IMPRESSION: 1. No active lung disease.  Slight hyper aeration. 2. The fracture of the distal right clavicle with dislocation of the right AC joint noted on right shoulder films is not as well seen by chest x-ray. 3. Degenerative changes of the shoulders. Electronically Signed   By: Ivar Drape M.D.   On: 05/13/2017 10:39   Dg Chest 2 View  Result Date: 05/13/2017 CLINICAL DATA:  77 year old female history of cough for 1 month. EXAM: CHEST  2 VIEW COMPARISON:  Chest x-ray 04/30/2016. FINDINGS: Lung volumes are normal. No consolidative airspace disease. No pleural effusions. No pneumothorax. No pulmonary nodule or mass noted. Pulmonary vasculature and the cardiomediastinal silhouette are within  normal limits. Atherosclerosis in the thoracic aorta. IMPRESSION: 1.  No radiographic evidence of acute cardiopulmonary disease. 2. Aortic atherosclerosis. Electronically Signed   By: Vinnie Langton M.D.   On: 05/13/2017 08:47   Dg Lumbar Spine Complete  Result Date: 05/13/2017 CLINICAL DATA:  Cough, fell yesterday with some low back pain EXAM: LUMBAR SPINE - COMPLETE 4+ VIEW COMPARISON:  None. FINDINGS: The lumbar vertebrae are slightly straightened in alignment. There is what appears to be and acute compression deformity of L1 vertebral body of approximately 40%. There may be slight retropulsion at the level of the compression deformity. The bones are osteopenic. There is degenerative disc disease at L4-5 with some loss of disc space. The SI joints are corticated. IMPRESSION: 1. Probable acute compression deformity of L1 vertebral body of approximately 40% with some retropulsion. 2. Diffuse osteopenia. 3. 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 Electronically Signed   By: Ivar Drape M.D.   On: 05/13/2017 10:26   Dg Pelvis 1-2 Views  Result Date: 05/13/2017 CLINICAL DATA:  [place] yesterday now with low back pain EXAM: PELVIS - 1-2 VIEW COMPARISON:  None FINDINGS: There is mild degenerative joint disease with some loss of joint space bilaterally. No hip fracture is seen. There appears to be and old left inferior pubic ramus fracture but clinical correlation is recommended. Also there is a faint lucency through the right pubic ramus most likely overlapping, but a subtle fracture cannot be excluded. The pubic ramus is in normal position. The SI joints are corticated. IMPRESSION: 1. Probable old fracture of the left inferior pelvic ramus. Correlate clinically. 2. Vague lucency through the right pubic ramus may be due to overlapping structures, but correlate clinically or consider CT of the pelvis to assess these areas further. Electronically Signed   By: Ivar Drape M.D.   On: 05/13/2017 12:10   Dg  Shoulder Right  Result Date: 05/13/2017 CLINICAL DATA:  Recent fall, back pain EXAM: RIGHT SHOULDER - 2+ VIEW COMPARISON:  Chest x-ray of 05/13/2017 and 05/12/2017 FINDINGS: Fracture separation of the right [facility] joint is noted. There is a significant degenerative joint disease of the right glenohumeral joint with loss of joint space and spurring. Cortical discontinuity of the posterior right seventh rib is noted which could represent fracture or old deformity. IMPRESSION: 1. Fracture/separation of the right [facility] joint which may be acute. Correlate clinically. 2. 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 Electronically Signed   By: Ivar Drape M.D.   On: 05/13/2017 10:29   Ct Head Wo Contrast  Result Date:  05/13/2017 CLINICAL DATA:  Fall, posterior skull injury, hematoma.  Neck pain. EXAM: CT HEAD WITHOUT CONTRAST CT CERVICAL SPINE WITHOUT CONTRAST TECHNIQUE: Multidetector CT imaging of the head and cervical spine was performed following the standard protocol without intravenous contrast. Multiplanar CT image reconstructions of the cervical spine were also generated. COMPARISON:  None. FINDINGS: CT HEAD FINDINGS Brain: No evidence of acute infarction, hemorrhage, hydrocephalus, extra-axial collection or mass lesion/mass effect. Mild cortical atrophy. Subcortical white matter and periventricular small vessel ischemic changes. Vascular: Intracranial atherosclerosis. Skull: Normal. Negative for fracture or focal lesion. Sinuses/Orbits: Partial opacification of the bilateral ethmoid and sphenoid sinuses. Opacification of the right mastoid air cells. Other: Mild soft tissue swelling/hematoma overlying the right posterior vertex. CT CERVICAL SPINE FINDINGS Alignment: Normal cervical lordosis. Skull base and vertebrae: No acute fracture. No primary bone lesion or focal pathologic process. Soft tissues and spinal canal: No prevertebral fluid or swelling. No visible canal hematoma. Disc levels:   Mild degenerative changes of the mid cervical spine. Spinal canal is patent. Upper chest: Visualized lung apices are notable for biapical pleural-parenchymal scarring. Other: Visualized thyroid is unremarkable. IMPRESSION: Mild soft tissue swelling/hematoma overlying the right posterior vertex. No evidence of calvarial fracture. No evidence of acute intracranial abnormality. Atrophy with small vessel ischemic changes. Opacification of the right mastoid air cells. No evidence of traumatic injury to the cervical spine. Electronically Signed   By: Julian Hy M.D.   On: 05/13/2017 11:28   Ct Angio Chest Pe W Or Wo Contrast  Result Date: 05/23/2017 CLINICAL DATA:  Evaluate for pulmonary embolus. EXAM: CT ANGIOGRAPHY CHEST WITH CONTRAST TECHNIQUE: Multidetector CT imaging of the chest was performed using the standard protocol during bolus administration of intravenous contrast. Multiplanar CT image reconstructions and MIPs were obtained to evaluate the vascular anatomy. CONTRAST:  111mL ISOVUE-370 IOPAMIDOL (ISOVUE-370) INJECTION 76% COMPARISON:  None. FINDINGS: Cardiovascular: The heart size appears normal. No pericardial effusion. Aortic atherosclerosis noted. Calcification within the LAD coronary artery is noted. The main pulmonary artery appears patent. No saddle embolus or central obstructing emboli identified. No lobar or segmental pulmonary artery filling defects identified. Mediastinum/Nodes: Normal appearance of the thyroid gland. The trachea appears patent and is midline. Normal appearance of the esophagus. No enlarged mediastinal or hilar lymph nodes. Lungs/Pleura: No pleural effusion. Dependent changes and subsegmental atelectasis noted in the left base. Within the medial right upper lobe there is a necrotic appearing lung mass which extends into the posterior mediastinum posterior to the trachea and has mass effect upon the proximal esophagus. This measures 3.0 by 4.0 by 3.9 cm. Several satellite  nodules are identified within the right upper lobe, the largest appears cavitary measuring 1.2 cm, image 17 of series 10. Upper Abdomen: No acute abnormality. Musculoskeletal: No suspicious bone lesions. No acute bone abnormality. Review of the MIP images confirms the above findings. IMPRESSION: 1. Negative for acute pulmonary embolus. 2. Mass within the medial right upper lobe is identified invading the posterior mediastinum which is suspicious for primary bronchogenic carcinoma. Several additional satellite nodules are noted within the right upper lobe. Further evaluation with PET-CT and tissue sampling is advised. 3. Aortic atherosclerosis and coronary artery calcifications. Aortic Atherosclerosis (ICD10-I70.0). Electronically Signed   By: Kerby Moors M.D.   On: 05/23/2017 11:32   Ct Cervical Spine Wo Contrast  Result Date: 05/13/2017 CLINICAL DATA:  Fall, posterior skull injury, hematoma.  Neck pain. EXAM: CT HEAD WITHOUT CONTRAST CT CERVICAL SPINE WITHOUT CONTRAST TECHNIQUE: Multidetector CT imaging of the head and  cervical spine was performed following the standard protocol without intravenous contrast. Multiplanar CT image reconstructions of the cervical spine were also generated. COMPARISON:  None. FINDINGS: CT HEAD FINDINGS Brain: No evidence of acute infarction, hemorrhage, hydrocephalus, extra-axial collection or mass lesion/mass effect. Mild cortical atrophy. Subcortical white matter and periventricular small vessel ischemic changes. Vascular: Intracranial atherosclerosis. Skull: Normal. Negative for fracture or focal lesion. Sinuses/Orbits: Partial opacification of the bilateral ethmoid and sphenoid sinuses. Opacification of the right mastoid air cells. Other: Mild soft tissue swelling/hematoma overlying the right posterior vertex. CT CERVICAL SPINE FINDINGS Alignment: Normal cervical lordosis. Skull base and vertebrae: No acute fracture. No primary bone lesion or focal pathologic process. Soft  tissues and spinal canal: No prevertebral fluid or swelling. No visible canal hematoma. Disc levels:  Mild degenerative changes of the mid cervical spine. Spinal canal is patent. Upper chest: Visualized lung apices are notable for biapical pleural-parenchymal scarring. Other: Visualized thyroid is unremarkable. IMPRESSION: Mild soft tissue swelling/hematoma overlying the right posterior vertex. No evidence of calvarial fracture. No evidence of acute intracranial abnormality. Atrophy with small vessel ischemic changes. Opacification of the right mastoid air cells. No evidence of traumatic injury to the cervical spine. Electronically Signed   By: Julian Hy M.D.   On: 05/13/2017 11:28   Ct Lumbar Spine Wo Contrast  Result Date: 05/13/2017 CLINICAL DATA:  Status post fall yesterday.  Back pain. EXAM: CT LUMBAR SPINE WITHOUT CONTRAST TECHNIQUE: Multidetector CT imaging of the lumbar spine was performed without intravenous contrast administration. Multiplanar CT image reconstructions were also generated. COMPARISON:  None. FINDINGS: Segmentation: 5 lumbar type vertebrae. Alignment: Normal. Vertebrae: Generalized osteopenia. L1 compression fracture with approximately 50% central height loss. 3 mm retropulsion of the posterior inferior margin of L1 vertebral body with minimal impression on the thecal sac. Fracture does not extend into the pedicles or posterior elements. Remainder the vertebral body heights are maintained. No aggressive osseous lesion. No discitis or osteomyelitis. Paraspinal and other soft tissues: No focal paraspinal abnormality. Abdominal aortic atherosclerosis. Other: Osteoarthritis of bilateral sacroiliac joints. 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 Bilateral neural foramina are patent. Bilateral facet arthropathy at L1-2, L2-3, and L3-4. IMPRESSION: 1. Acute L1 vertebral body compression fracture with approximately 50% central height loss.  Electronically Signed   By: Kathreen Devoid   On: 05/13/2017 13:28   Mr Lumbar Spine W Wo Contrast  Result Date: 05/27/2017 CLINICAL DATA:  Weakness. Altered mental status. Fall. L1 fracture. Chest mass and adenopathy. EXAM: MRI LUMBAR SPINE WITHOUT AND WITH CONTRAST TECHNIQUE: Multiplanar and multiecho pulse sequences of the lumbar spine were obtained without and with intravenous contrast. CONTRAST:  41mL MULTIHANCE GADOBENATE DIMEGLUMINE 529 MG/ML IV SOLN COMPARISON:  CT of the lumbar spine 05/13/2014. CT chest 05/23/2017. FINDINGS: Segmentation: 5 non rib-bearing lumbar type vertebral bodies are present. Alignment: AP alignment is anatomic. Straightening of the normal lumbar lordosis is stable. Vertebrae: A pathologic compression fracture is present at L1. There is enhancing component posteriorly at L1 extending into the left pedicle. Edema is present throughout the vertebral body. A remote superior endplate fractures present at T12. Additional signal abnormality enhancement is present in the sacrum. This suggests a second lesion. No other focal marrow signal abnormality is present to suggest metastatic disease elsewhere. Conus medullaris and cauda equina: Conus extends to the L1 level. Conus and cauda equina appear normal. Paraspinal and other soft tissues: Heterogeneous collections are now present within the psoas musculature bilaterally. This appears to  originate at the L1 level. There is no definite enhancement associated with the collections. These are new since the CT scan. Disc levels: Disc signal is preserved.  Disc infection is considered unlikely. T11-12: Mild disc bulging is present without significant stenosis. T12-L1: Retropulsed bone or soft tissue associated with the L1 compression fracture effaces the ventral CSF with moderate central canal narrowing. Mild foraminal narrowing is present bilaterally. L1-2: Retropulsed bone effaces the ventral CSF above this level. Mild foraminal narrowing is  present bilaterally. L2-3: Mild disc bulging is present. There is no significant stenosis. L3-4: Mild disc bulging is present there is no significant stenosis. L4-5: A broad-based disc protrusion is present. Mild left subarticular narrowing is present. Foramina are patent bilaterally. L5-S1: A leftward disc protrusion is present without significant stenosis. IMPRESSION: 1. Enhancing lesion within the posterior aspect of the L1 fracture with extraosseous extension suggesting metastatic disease and pathologic fracture. 2. Focal signal abnormality and enhancement at S3 likely also represents focal metastasis. 3. New mixed intensity collections within the psoas musculature bilaterally compatible with hemorrhage. Infection is considered unlikely. 4. Retropulsed bone narrows the spinal canal with mild central canal stenosis at L1. 5. Mild foraminal narrowing bilaterally at T12-L1 and L1-2. Electronically Signed   By: San Morelle M.D.   On: 05/27/2017 13:31    ASSESSMENT & PLAN:   #77 year old female patient with long-standing rheumatoid arthritis is currently admitted the hospital for MRSA bacteremia/noted to have a lung mass.  # Lung mass right upper lobe-highly concerning for malignancy/also L1 pathologic fracture associated mass.  Recommend tissue diagnosis-however felt to be at high risk for invasive procedures.  Recommend a PET scan; discussed with Dr. Jackquline Berlin kindly agreed to get a PET scan inpatient.  Will recommend tissue biopsy based upon PET scan.  # L1 pathologic fracture-again highly suspicious of malignancy; patient might benefit from radiation/kyphoplasty-biopsy.  However, tissue  Obtained might be difficult to assess for molecular testing.  Continue pain medication.  # MRSA bacteremia/vancomycin-followed by ID.  Thank you Dr.Konidena  for allowing me to participate in the care of your pleasant patient. Please do not hesitate to contact me with questions or concerns in the interim.  Also spoke to pt's son over the phone-who feels his mom is not ready/strong enough for any  interventions like bronchoscopy at this time.  Also spoke with Dr. Normand Sloop.  Patient's case was also discussed at the tumor conference this afternoon.    Cammie Sickle, MD 05/28/2017 5:13 PM

## 2017-05-28 NOTE — Progress Notes (Signed)
Nutrition Follow-up  DOCUMENTATION CODES:   Not applicable  INTERVENTION:  Patient has not had a documented bowel movement this admission. Consider more aggressive bowel regimen as her last reported BM was 2/13.  Continue Ensure Enlive po BID, each supplement provides 350 kcal and 20 grams of protein.  Continue Magic cup TID with meals, each supplement provides 290 kcal and 9 grams of protein.  Continue MVI daily.  NUTRITION DIAGNOSIS:   Increased nutrient needs related to wound healing as evidenced by estimated needs.  Ongoing.  GOAL:   Patient will meet greater than or equal to 90% of their needs  Not met or progressing - only taking bites/sips.  MONITOR:   PO intake, Supplement acceptance, Labs, Weight trends, Skin, I & O's  REASON FOR ASSESSMENT:   Malnutrition Screening Tool    ASSESSMENT:   77 year old female with PMHx of RA, recent L1 fracture, right clavicle fracture, asthma, anxiety, restless leg syndrome who presents with generalized weakness, poor PO intake, and found to have MRSA bacteremia, CT chest concerning for malignancy, pressure injury to right gluteal region.   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  Met with patient at bedside. She was resting but woke to name call. She reports her appetite is "fair." When RD asked if she was going to eat lunch today she reported she had already had everything she wanted. Discussed with RN and patient has not wanted anything to eat today. She just had some milk. Magic Cup did not come on tray today. She had a few sips of Ensure.  Medications reviewed and include: vitamin D 1000 units daily, ferrous sulfate 325 mg BID, Remeron 30 mg daily, MVI daily, pantoprazole, Senokot-S, vancomycin.  Labs reviewed: Sodium 130, Chloride 94, Creatinine 0.37.  I/O: 1 occurrence UOP today (unmeasured)  No subsequent weights since admission to trend.  Diet Order:  Diet regular Room service  appropriate? Yes; Fluid consistency: Thin Diet NPO time specified Except for: Other (See Comments)  EDUCATION NEEDS:   Not appropriate for education at this time  Skin:  Skin Assessment: Skin Integrity Issues: Skin Integrity Issues:: Stage II Stage II: right buttocks  Last BM:  PTA (05/20/2017)  Height:   Ht Readings from Last 1 Encounters:  05/22/17 4' 8"  (1.422 m)    Weight:   Wt Readings from Last 1 Encounters:  05/22/17 105 lb (47.6 kg)    Ideal Body Weight:  42.4 kg  BMI:  Body mass index is 23.54 kg/m.  Estimated Nutritional Needs:   Kcal:  1200-1430 (25-30 kcal/kg)  Protein:  57-67 grams (1.2-1.4 grams/kg)  Fluid:  1.2 L/day (25 mL/kg)  Willey Blade, MS, RD, LDN Office: [phone] Pager: [phone] After Hours/Weekend Pager: [phone]

## 2017-05-28 NOTE — Progress Notes (Signed)
Patient ID: Linda Walsh, female   DOB: [birth year], 77 y.o.   MRN: [id number] Request for an image guided bone biopsy.  76 with probable pathologic fracture at L1 and suspicious right upper lung lesion.  Imaging findings are concerning for metastatic lung cancer.  In addition, patient has MRSA bacteremia.  MRI demonstrates new fluid collections in bilateral psoas muscles that may represent hemorrhage but indeterminate.    Discussed with Dr. Ola Spurr of Infectious Disease and offered to aspirate the psoas fluid if needed.  He didn't need the aspiration at this time.  With regards to the pathologic L1 fracture, would not recommend biopsy of the lesion at this time.  Bone biopsy is often inadequate to evaluate lung neoplasm if molecular testing is needed and biopsy of this area will be difficult due the retropulsed bone. Recommend PET/CT to look for other potential biopsy sites.

## 2017-05-28 NOTE — Consult Note (Addendum)
Consultation Note Date: 05/28/2017   Patient Name: Linda Walsh  DOB: [birth year]  MRN: [id number]  Age / Sex: 77 y.o., female  PCP: Remi Haggard, FNP Referring Physician: Epifanio Lesches, MD  Reason for Consultation: Establishing goals of care  HPI/Patient Profile: Linda Walsh  is a 77 y.o. female with a history of  Cardiomyopathy and rheumatoid arthritis who presents to the hospital from a group home due to weakness, lethargy, poor by mouth intake. Patient was recently in the hospital related to asthma exacerbation due to flu and also noted to have a L1 fracture, displaced fracture of the right clavicle which was managed nonoperatively.  She now returns back to the hospital with weakness, lethargy, and poor PO intake. She is noted to have a urinary tract infection also noted to be hypokalemic. BC obtained, MRSA bacteremia.  She is noted to have a lung mass and spine mass concerning for cancer.    Clinical Assessment and Goals of Care: Linda Walsh is resting in bed. She has lived in a group home for the past 7 years due to RA in her hands. She is ambulatory around the home, but spends her days watching old movies. She has 1 son. Spoke with him via phone, he states that since this past fall she has required help with her ADL's. He states he sees her several times per week. She has had limited mobility during hospitalization. Poor PO intake.   Patient consents for call to son to discuss status, he states he will not be coming today, but can come tomorrow. Patient and son are aware she may have cancer. He states the decisions will be up to her. They would like work up for a definitive diagnosis. She is unsure if she would want oncologic treatment, but son states he does not believe at her age and with her health status it would be a good idea. Dr. Vianne Bulls at bedside.   Discussed code status. She does not  want chest compressions, shocks, or a breathing tube if her heart or breathing stops. She would like to have a breathing tube and be placed on a ventilator for respiratory distress/failure, she is aware of the poor prognosis if she were to be intubated.   Teams working on diagnostics.      SUMMARY OF RECOMMENDATIONS    Will continue to follow for [place].   Code Status/Advance Care Planning:  Limited code   Do not resuscitate,  but wants intubation for respiratory distress/failure.    Symptom Management:   Morphine 1mg  q 2 hours PRN for severe pain.   Palliative Prophylaxis:   Oral Care and Turn Reposition  Prognosis:   Poor.   Discharge Planning: To Be Determined      Primary Diagnoses: Present on Admission: . UTI (urinary tract infection)   I have reviewed the medical record, interviewed the patient and family, and examined the patient. The following aspects are pertinent.  Past Medical History:  Diagnosis Date  . Arthritis   . CHF (congestive heart  failure) ([place])   . Depressed   . MRSA bacteremia 05/23/2017  . Renal disorder    Social History   Socioeconomic History  . Marital status: Widowed    Spouse name: None  . Number of children: None  . Years of education: None  . Highest education level: None  Social Needs  . Financial resource strain: None  . Food insecurity - worry: None  . Food insecurity - inability: None  . Transportation needs - medical: None  . Transportation needs - non-medical: None  Occupational History  . None  Tobacco Use  . Smoking status: Never Smoker  . Smokeless tobacco: Never Used  Substance and Sexual Activity  . Alcohol use: No  . Drug use: No  . Sexual activity: None  Other Topics Concern  . None  Social History Narrative  . None   Family History  Problem Relation Age of Onset  . Heart disease Father    Scheduled Meds: . morphine      . aspirin EC  81 mg Oral Daily  . atorvastatin  20 mg Oral Daily  .  budesonide  2 mL Nebulization Daily  . cholecalciferol  1,000 Units Oral Daily  . collagenase   Topical Daily  . enoxaparin (LOVENOX) injection  40 mg Subcutaneous Q24H  . feeding supplement (ENSURE ENLIVE)  237 mL Oral BID BM  . ferrous sulfate  325 mg Oral BID WC  . hydroxychloroquine  400 mg Oral Daily  . loratadine  10 mg Oral Daily  . LORazepam  1 mg Oral QPM  . Melatonin  5 mg Oral QHS  . metaxalone  800 mg Oral TID  . metoprolol succinate  50 mg Oral Daily  . mirtazapine  30 mg Oral QHS  . mometasone-formoterol  2 puff Inhalation BID  . multivitamin with minerals  1 tablet Oral Daily  . pantoprazole  40 mg Oral Daily  . potassium chloride  20 mEq Oral Daily  . rOPINIRole  0.5 mg Oral QHS  . senna-docusate  1 tablet Oral BID  . sodium chloride flush  10-40 mL Intracatheter Q12H   Continuous Infusions: . vancomycin 750 mg (05/28/17 1046)   PRN Meds:.acetaminophen **OR** acetaminophen, albuterol, guaiFENesin-dextromethorphan, ondansetron **OR** ondansetron (ZOFRAN) IV, sodium chloride flush, traMADol Medications Prior to Admission:  Prior to Admission medications   Medication Sig Start Date End Date Taking? Authorizing Provider  acetaminophen (TYLENOL) 500 MG tablet Take 500 mg by mouth 2 (two) times daily. 10/24/09  Yes [provider]  aspirin EC 81 MG tablet Take 81 mg by mouth daily.   Yes [provider]  atorvastatin (LIPITOR) 20 MG tablet Take 20 mg by mouth daily. 10/27/13  Yes [provider]  budesonide (PULMICORT) 0.5 MG/2ML nebulizer solution Take 2 mLs by nebulization daily. 09/22/16  Yes [provider]  cetirizine (ZYRTEC) 10 MG tablet Take 10 mg by mouth daily.   Yes [provider]  Cholecalciferol (VITAMIN D-1000 MAX ST) 1000 units tablet Take 1,000 mg by mouth daily. 06/21/15  Yes [provider]  feeding supplement, ENSURE ENLIVE, (ENSURE ENLIVE) LIQD Take 237 mLs by mouth 2 (two) times daily between meals.  05/15/17  Yes Mody, Ulice Bold, MD  ferrous sulfate 325 (65 FE) MG tablet Take 325 mg by mouth 2 (two) times daily.   Yes [provider]  Fluticasone-Salmeterol (ADVAIR) 250-50 MCG/DOSE AEPB Inhale 1 puff into the lungs 2 (two) times daily.   Yes [provider]  hydroxychloroquine (PLAQUENIL) 200  MG tablet Take 400 mg by mouth daily.  01/18/16  Yes [provider]  LORazepam (ATIVAN) 1 MG tablet Take 1 mg by mouth every evening.    Yes [provider]  metoprolol succinate (TOPROL-XL) 25 MG 24 hr tablet Take 25 mg by mouth daily.   Yes [provider]  mirtazapine (REMERON) 30 MG tablet Take 30 mg by mouth at bedtime.   Yes [provider]  mupirocin ointment (BACTROBAN) 2 % Place 1 application into the nose 2 (two) times daily. 05/15/17  Yes Mody, Ulice Bold, MD  pantoprazole (PROTONIX) 40 MG tablet Take 1 tablet by mouth daily. 05/12/17  Yes [provider]  PROAIR HFA 108 (90 Base) MCG/ACT inhaler Inhale 2 puffs into the lungs every 4 (four) hours as needed. 05/12/17  Yes [provider]  rOPINIRole (REQUIP) 0.5 MG tablet Take 0.5 mg by mouth at bedtime.   Yes [provider]  senna-docusate (SENOKOT-S) 8.6-50 MG tablet Take 1 tablet by mouth 2 (two) times daily.   Yes [provider]  Suvorexant (BELSOMRA) 15 MG TABS Take 15 mg by mouth at bedtime.    Yes [provider]  traMADol (ULTRAM) 50 MG tablet Take 1 tablet (50 mg total) by mouth every 6 (six) hours as needed. 05/15/17 05/15/18 Yes Mody, Ulice Bold, MD  calcium-vitamin D (OSCAL WITH D) 500-200 MG-UNIT tablet Take 1 tablet by mouth 2 (two) times daily. Patient not taking: Reported on 05/22/2017 05/15/17   Bettey Costa, MD   No Known Allergies Review of Systems  Musculoskeletal: Positive for back pain.    Physical Exam  Constitutional: No distress.  Pulmonary/Chest: Effort normal.  Musculoskeletal:  Hands with RA deformities.  Neurological: She is alert.    Oriented.   Skin: Skin is warm and dry.    Vital Signs: BP (!) 157/82 (BP Location: Right Arm)   Pulse (!) 108   Temp 98.7 F (37.1 C) (Oral)   Resp 18   Ht 4\' 8"  (1.422 m)   Wt 47.6 kg (105 lb)   SpO2 96%   BMI 23.54 kg/m  Pain Assessment: 0-10 POSS *See Group Information*: S-Acceptable,Sleep, easy to arouse Pain Score: 7    SpO2: SpO2: 96 % O2 Device:SpO2: 96 % O2 Flow Rate: .   IO: Intake/output summary:   Intake/Output Summary (Last 24 hours) at 05/28/2017 1105 Last data filed at 05/28/2017 0900 Gross per 24 hour  Intake 40 ml  Output -  Net 40 ml    LBM: Last BM Date: 05/20/17 Baseline Weight: Weight: 47.6 kg (105 lb) Most recent weight: Weight: 47.6 kg (105 lb)     Palliative Assessment/Data: 30%     Time In: 10:40 Time Out: 11:30 Time Total: 50 min Greater than 50%  of this time was spent counseling and coordinating care related to the above assessment and plan.  Signed by: Asencion Gowda, NP   Please contact Palliative Medicine Team phone at [phone] for questions and concerns.  For individual provider: See Shea Evans

## 2017-05-28 NOTE — Progress Notes (Signed)
[facility] [employer] NAME: Linda Walsh    MR#:  [id number]  DATE OF BIRTH:  24-Jul-1940  SUBJECTIVE: Admitted for failure to thrive, weakness and found to have UTI, hypokalemia.  Patient found to have MRSA bacteremia,.  Possible bronchogenic carcinoma possible bone metastases.  Complains of severe back pain,  CHIEF COMPLAINT:   Chief Complaint  Patient presents with  . Failure To Thrive    REVIEW OF SYSTEMS:   ROS CONSTITUTIONAL: Cough, complains of back pain EYES: No blurred or double vision.  EARS, NOSE, AND THROAT: No tinnitus or ear pain.  RESPIRATORY: Cough,  No shortness of breath, wheezing or hemoptysis.  CARDIOVASCULAR: No chest pain, orthopnea, edema.  GASTROINTESTINAL: No nausea, vomiting, diarrhea or abdominal pain.  Poor p.o. intake GENITOURINARY: No dysuria, hematuria.  ENDOCRINE: No polyuria, nocturia,  HEMATOLOGY: No anemia, easy bruising or bleeding SKIN: Small decubiti on the right buttock MUSCULOSKELETAL: Diffuse joint pains NEUROLOGIC: No tingling, numbness, weakness.  PSYCHIATRY: No anxiety or depression.   DRUG ALLERGIES:  No Known Allergies  VITALS:  Blood pressure (!) 157/82, pulse (!) 108, temperature 98.7 F (37.1 C), temperature source Oral, resp. rate 18, height 4\' 8"  (1.422 m), weight 47.6 kg (105 lb), SpO2 96 %.  PHYSICAL EXAMINATION:  GENERAL:  77 y.o.-year-old patient lying in the bed with no acute distress.  Very  Frail individual EYES: Pupils equal, round, reactive to light.No scleral icterus. Extraocular muscles intact.  HEENT: Head atraumatic, normocephalic. Oropharynx and nasopharynx clear.  NECK:  Supple, no jugular venous distention. No thyroid enlargement, no tenderness.  LUNGS: Normal breath sounds bilaterally, no wheezing, rales,rhonchi or crepitation. No use of accessory muscles of respiration.  CARDIOVASCULAR: S1, S2 tachycardic.Marland Kitchen No murmurs, rubs, or gallops.  ABDOMEN: Soft,  nontender, nondistended. Bowel sounds present. No organomegaly or mass.  EXTREMITIES: No pedal edema, cyanosis, or clubbing.  NEUROLOGIC: Cranial nerves II through XII are intact. Muscle strength 5/5 in all extremities. Sensation intact. Gait not checked.  PSYCHIATRIC: The patient is alert and oriented x 3.  SKIN: Sacral decubiti on the right buttock with some eschar in the center, slight pus around the periphery but patient has no tenderness the size around 3-2 cm.  Consult wound care for staging, dressing recommendations.   LABORATORY PANEL:   CBC Recent Labs  Lab 05/27/17 0306  WBC 6.8  HGB 10.6*  HCT 31.0*  PLT 424   ------------------------------------------------------------------------------------------------------------------  Chemistries  Recent Labs  Lab 05/27/17 0306 05/28/17 0303  NA 132* 130*  K 3.9 3.8  CL 97* 94*  CO2 27 26  GLUCOSE 96 128*  BUN 8 8  CREATININE 0.39* 0.37*  CALCIUM 7.7* 8.0*  MG 1.9  --    ------------------------------------------------------------------------------------------------------------------  Cardiac Enzymes Recent Labs  Lab 05/22/17 1249  TROPONINI 0.03*   ------------------------------------------------------------------------------------------------------------------  RADIOLOGY:  Mr Lumbar Spine W Wo Contrast  Result Date: 05/27/2017 CLINICAL DATA:  Weakness. Altered mental status. Fall. L1 fracture. Chest mass and adenopathy. EXAM: MRI LUMBAR SPINE WITHOUT AND WITH CONTRAST TECHNIQUE: Multiplanar and multiecho pulse sequences of the lumbar spine were obtained without and with intravenous contrast. CONTRAST:  16mL MULTIHANCE GADOBENATE DIMEGLUMINE 529 MG/ML IV SOLN COMPARISON:  CT of the lumbar spine 05/13/2014. CT chest 05/23/2017. FINDINGS: Segmentation: 5 non rib-bearing lumbar type vertebral bodies are present. Alignment: AP alignment is anatomic. Straightening of the normal lumbar lordosis is stable. Vertebrae: A  pathologic compression fracture is present at L1. There is enhancing component posteriorly at  L1 extending into the left pedicle. Edema is present throughout the vertebral body. A remote superior endplate fractures present at T12. Additional signal abnormality enhancement is present in the sacrum. This suggests a second lesion. No other focal marrow signal abnormality is present to suggest metastatic disease elsewhere. Conus medullaris and cauda equina: Conus extends to the L1 level. Conus and cauda equina appear normal. Paraspinal and other soft tissues: Heterogeneous collections are now present within the psoas musculature bilaterally. This appears to originate at the L1 level. There is no definite enhancement associated with the collections. These are new since the CT scan. Disc levels: Disc signal is preserved.  Disc infection is considered unlikely. T11-12: Mild disc bulging is present without significant stenosis. T12-L1: Retropulsed bone or soft tissue associated with the L1 compression fracture effaces the ventral CSF with moderate central canal narrowing. Mild foraminal narrowing is present bilaterally. L1-2: Retropulsed bone effaces the ventral CSF above this level. Mild foraminal narrowing is present bilaterally. L2-3: Mild disc bulging is present. There is no significant stenosis. L3-4: Mild disc bulging is present there is no significant stenosis. L4-5: A broad-based disc protrusion is present. Mild left subarticular narrowing is present. Foramina are patent bilaterally. L5-S1: A leftward disc protrusion is present without significant stenosis. IMPRESSION: 1. Enhancing lesion within the posterior aspect of the L1 fracture with extraosseous extension suggesting metastatic disease and pathologic fracture. 2. Focal signal abnormality and enhancement at S3 likely also represents focal metastasis. 3. New mixed intensity collections within the psoas musculature bilaterally compatible with hemorrhage.  Infection is considered unlikely. 4. Retropulsed bone narrows the spinal canal with mild central canal stenosis at L1. 5. Mild foraminal narrowing bilaterally at T12-L1 and L1-2. Electronically Signed   By: San Morelle M.D.   On: 05/27/2017 13:31    EKG:   Orders placed or performed during the hospital encounter of 05/22/17  . ED EKG  . ED EKG  . EKG 12-Lead  . EKG 12-Lead  . EKG    ASSESSMENT AND PLAN:  77 year old female with past medical history of rheumatoid arthritis, recent L1 fracture, right clavicle fracture managed nonoperatively and also has asthma, anxiety, restless leg syndrome comes from group home because of generalized weakness, poor p.o. intake and found to have severe hypokalemia, UTI. #1 .MRSA bacteremia: Patient is on vancomycin, repeat blood cultures have been negative for 48 hrs. from February 17.  Patient did get a PICC line for 2 weeks of antibiotics  With  vancomycin.   echocardiogram shows EF around 55-60% without any vegetations.  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 Rogue Bussing from oncology who can see the patient and recommend further workup/testing.   2.  Severe hypokalemia secondary to poor p.o. intake.  Pharmacy consulted for  ELECTROLYTE  management.  Hypokalemia improving. Continue IV hydration with poor p.o. intake.  3.  Generalized weakness: Get physical therapy consulted 4.  Hyperlipidemia: Continue atorvastatin. 5.  Asthma: No wheezing.  But does have cough.  Continue bronchodilators, Tussionex.,  Further workup for possible bronchogenic carcinoma/abcess including pulmonary consult, oncology consult  6.  History of rheumatoid arthritis: Continue  Plaquenil.,  Patient is on rest home because of her severe rheumatoid arthritis. 7.  Restless leg syndrome: Continue  Requip. Essential hypertension and now has tachycardia continue Toprol-XL. #8/. pressure ulcer on the right gluteal region: C wound care consulted, recommended daily dressing changes a with Santyl. #9 Klebsiella UTI: Patient is on Rocephin.  changed to Keflex stop date the 21st. 10 #10 possible metastatic lung cancer: Patient is unaware of results of CT chest, MRI of the spine.  Appreciate oncology calling him and discussing the results and also further treatment plan.  Unable to have bone biopsy due to inadequate sampling that will be available after the bone biopsy so patient needs EBUS/PET scan.  Not a candidate for aggressive interventions like chemotherapy due to her frail status, failure to thrive.  #11 .patient told us that she does not want to be kept alive by artificial means like CPR but wanst intubation.  . All the records are reviewed and case discussed with Care Management/Social Workerr. Management plans discussed with the patient, family and they are in agreement.  CODE STATUS: partial  TOTAL TIME TAKING CARE OF THIS PATIENT: 35 minutes.     Epifanio Lesches M.D on 05/28/2017 at 1:06 PM  Between 7am to 6pm - Pager - [phone]  After 6pm go to [email or web address] - password [email or web address]  Office  [phone]  CC: Primary care physician; Remi Haggard, FNP   Note: This dictation was prepared with Dragon dictation along with smaller phrase technology. Any transcriptional errors that result from this process are unintentional.

## 2017-05-28 NOTE — Progress Notes (Signed)
[name] INFECTIOUS DISEASE PROGRESS NOTE Date of Admission:  05/22/2017     ID: Linda Walsh is a 78 y.o. female with  MRSA bacteremia Principal Problem:   MRSA bacteremia Active Problems:   UTI (urinary tract infection)   Pressure injury of skin   Subjective: No fevers, still very weak all over.   ROS  Eleven systems are reviewed and negative except per hpi  Medications:  Antibiotics Given (last 72 hours)    Date/Time Action Medication Dose Rate   05/25/17 1525 Given   cephALEXin (KEFLEX) capsule 500 mg 500 mg    05/25/17 2103 Given   cephALEXin (KEFLEX) capsule 500 mg 500 mg    05/26/17 1021 Given   cephALEXin (KEFLEX) capsule 500 mg 500 mg    05/26/17 1022 Given   hydroxychloroquine (PLAQUENIL) tablet 400 mg 400 mg    05/26/17 1044 New Bag/Given   vancomycin (VANCOCIN) IVPB 750 mg/150 ml premix 750 mg 150 mL/hr   05/26/17 2201 Given   cephALEXin (KEFLEX) capsule 500 mg 500 mg    05/27/17 1023 Given   hydroxychloroquine (PLAQUENIL) tablet 400 mg 400 mg    05/27/17 1023 Given   cephALEXin (KEFLEX) capsule 500 mg 500 mg    05/27/17 1040 New Bag/Given   vancomycin (VANCOCIN) IVPB 750 mg/150 ml premix 750 mg 150 mL/hr   05/27/17 2156 Given   cephALEXin (KEFLEX) capsule 500 mg 500 mg    05/27/17 2207 New Bag/Given   vancomycin (VANCOCIN) IVPB 750 mg/150 ml premix 750 mg 150 mL/hr   05/28/17 0858 Given   hydroxychloroquine (PLAQUENIL) tablet 400 mg 400 mg    05/28/17 1046 New Bag/Given   vancomycin (VANCOCIN) IVPB 750 mg/150 ml premix 750 mg 150 mL/hr     . morphine      . aspirin EC  81 mg Oral Daily  . atorvastatin  20 mg Oral Daily  . budesonide  2 mL Nebulization Daily  . cholecalciferol  1,000 Units Oral Daily  . collagenase   Topical Daily  . enoxaparin (LOVENOX) injection  40 mg Subcutaneous Q24H  . feeding supplement (ENSURE ENLIVE)  237 mL Oral BID BM  . ferrous sulfate  325 mg Oral BID WC  . hydroxychloroquine  400 mg Oral Daily  . loratadine  10  mg Oral Daily  . LORazepam  1 mg Oral QPM  . Melatonin  5 mg Oral QHS  . metaxalone  800 mg Oral TID  . metoprolol succinate  50 mg Oral Daily  . mirtazapine  30 mg Oral QHS  . mometasone-formoterol  2 puff Inhalation BID  . multivitamin with minerals  1 tablet Oral Daily  . pantoprazole  40 mg Oral Daily  . potassium chloride  20 mEq Oral Daily  . rOPINIRole  0.5 mg Oral QHS  . senna-docusate  1 tablet Oral BID  . sodium chloride flush  10-40 mL Intracatheter Q12H    Objective: Vital signs in last 24 hours: Temp:  [98.5 F (36.9 C)-98.9 F (37.2 C)] 98.7 F (37.1 C) (02/21 0854) Pulse Rate:  [96-108] 108 (02/21 0854) Resp:  [16-18] 18 (02/21 0854) BP: (126-157)/(68-82) 157/82 (02/21 0854) SpO2:  [93 %-97 %] 96 % (02/21 0854) Constitutional:  Hoh, oriented to person, place, and time. Thin, frail, lying on l side in bed HENT: [place]/AT, PERRLA, no scleral icterus Mouth/Throat: Oropharynx is clear and dry . No oropharyngeal exudate.  Cardiovascular: Normal rate, regular rhythm and normal heart sounds. 2/6 sm Pulmonary/Chest: poor air movement, rhonchi  throughoug Neck = supple, no nuchal rigidity Abdominal: Soft. Bowel sounds are normal.  exhibits no distension. There is no tenderness.  Lymphadenopathy: no cervical adenopathy. No axillary adenopathy Neurological: alert and oriented to person, place, and time. She is able to move bil LE but 3-4/5 weakness Skin: Skin is warm and dry. R buttock with 2x3 cm irregular shallow ulceration with slough, mild surrounding erythema Psychiatric: very flat affect Ext no cce, has RA changes of hands ttp of l spine    Lab Results Recent Labs    05/27/17 0306 05/28/17 0303  WBC 6.8  --   HGB 10.6*  --   HCT 31.0*  --   NA 132* 130*  K 3.9 3.8  CL 97* 94*  CO2 27 26  BUN 8 8  CREATININE 0.39* 0.37*    Microbiology: Results for orders placed or performed during the hospital encounter of 05/22/17  Urine Culture     Status: Abnormal    Collection Time: 05/22/17 12:19 PM  Result Value Ref Range Status   Specimen Description   Final    URINE, CLEAN CATCH Performed at [facility], [address], [place], [place] [address]    Special Requests   Final    Normal Performed at [facility], [address] [place], [place] [address]    Culture >=100,000 COLONIES/mL KLEBSIELLA PNEUMONIAE (A)  Final   Report Status 05/25/2017 FINAL  Final   Organism ID, Bacteria KLEBSIELLA PNEUMONIAE (A)  Final      Susceptibility   Klebsiella pneumoniae - MIC*    AMPICILLIN >=32 RESISTANT Resistant     CEFAZOLIN <=4 SENSITIVE Sensitive     CEFTRIAXONE <=1 SENSITIVE Sensitive     CIPROFLOXACIN <=0.25 SENSITIVE Sensitive     GENTAMICIN <=1 SENSITIVE Sensitive     IMIPENEM <=0.25 SENSITIVE Sensitive     NITROFURANTOIN 64 INTERMEDIATE Intermediate     TRIMETH/SULFA <=20 SENSITIVE Sensitive     AMPICILLIN/SULBACTAM 4 SENSITIVE Sensitive     PIP/TAZO <=4 SENSITIVE Sensitive     Extended ESBL NEGATIVE Sensitive     * >=100,000 COLONIES/mL KLEBSIELLA PNEUMONIAE  Blood Culture (routine x 2)     Status: Abnormal   Collection Time: 05/22/17  2:13 PM  Result Value Ref Range Status   Specimen Description   Final    BLOOD BLOOD RIGHT HAND Performed at [facility], [address] [place], [place] [address]    Special Requests   Final    BOTTLES DRAWN AEROBIC AND ANAEROBIC Blood Culture results may not be optimal due to an inadequate volume of blood received in culture bottles Performed at [facility], [address] [place], [place] [address]    Culture  Setup Time   Final    GRAM POSITIVE COCCI IN BOTH AEROBIC AND ANAEROBIC BOTTLES CRITICAL RESULT CALLED TO, READ BACK BY AND VERIFIED WITH: Ayeshia Coppin BESANTI AT [id number] 05/23/17.PMH Performed at [facility], [place] [address], [place], [place] [address]    Culture METHICILLIN RESISTANT STAPHYLOCOCCUS AUREUS (A)  Final   Report Status 05/25/2017 FINAL   Final   Organism ID, Bacteria METHICILLIN RESISTANT STAPHYLOCOCCUS AUREUS  Final      Susceptibility   Methicillin resistant staphylococcus aureus - MIC*    CIPROFLOXACIN <=0.5 SENSITIVE Sensitive     ERYTHROMYCIN >=8 RESISTANT Resistant     GENTAMICIN <=0.5 SENSITIVE Sensitive     OXACILLIN >=4 RESISTANT Resistant     TETRACYCLINE <=1 SENSITIVE Sensitive  VANCOMYCIN 1 SENSITIVE Sensitive     TRIMETH/SULFA <=10 SENSITIVE Sensitive     CLINDAMYCIN <=0.25 SENSITIVE Sensitive     RIFAMPIN <=0.5 SENSITIVE Sensitive     Inducible Clindamycin NEGATIVE Sensitive     * METHICILLIN RESISTANT STAPHYLOCOCCUS AUREUS  Blood Culture (routine x 2)     Status: Abnormal   Collection Time: 05/22/17  2:13 PM  Result Value Ref Range Status   Specimen Description   Final    BLOOD BLOOD RIGHT FOREARM Performed at [facility], [address] [place], [place] [address]    Special Requests   Final    BOTTLES DRAWN AEROBIC AND ANAEROBIC Blood Culture adequate volume Performed at [facility], [address] [place], [place] [address]    Culture  Setup Time   Final    GRAM POSITIVE COCCI IN BOTH AEROBIC AND ANAEROBIC BOTTLES CRITICAL RESULT CALLED TO, READ BACK BY AND VERIFIED WITH: Lareina Espino BESANTI AT 7616 05/23/17.PMH Performed at [facility], [address] [place], [place] [address]    Culture (A)  Final    STAPHYLOCOCCUS AUREUS SUSCEPTIBILITIES PERFORMED ON PREVIOUS CULTURE WITHIN THE LAST 5 DAYS. Performed at [facility], [place] [address] [place], [place] [address]    Report Status 05/25/2017 FINAL  Final  Blood Culture ID Panel (Reflexed)     Status: Abnormal   Collection Time: 05/22/17  2:13 PM  Result Value Ref Range Status   Enterococcus species NOT DETECTED NOT DETECTED Final   Listeria monocytogenes NOT DETECTED NOT DETECTED Final   Staphylococcus species DETECTED (A) NOT DETECTED Final    Comment: CRITICAL RESULT CALLED TO, READ BACK BY AND  VERIFIED WITH: Merritt Kibby BESANTI AT 4854 05/23/17.PMH    Staphylococcus aureus DETECTED (A) NOT DETECTED Final    Comment: Methicillin (oxacillin)-resistant Staphylococcus aureus (MRSA). MRSA is predictably resistant to beta-lactam antibiotics (except ceftaroline). Preferred therapy is vancomycin unless clinically contraindicated. Patient requires contact precautions if  hospitalized. CRITICAL RESULT CALLED TO, READ BACK BY AND VERIFIED WITH: Lasondra Hodgkins BESANTI AT 6270 05/23/17.PMH    Methicillin resistance DETECTED (A) NOT DETECTED Final    Comment: CRITICAL RESULT CALLED TO, READ BACK BY AND VERIFIED WITH: Leyton Brownlee BESANTI AT 3500 05/23/17.PMH    Streptococcus species NOT DETECTED NOT DETECTED Final   Streptococcus agalactiae NOT DETECTED NOT DETECTED Final   Streptococcus pneumoniae NOT DETECTED NOT DETECTED Final   Streptococcus pyogenes NOT DETECTED NOT DETECTED Final   Acinetobacter baumannii NOT DETECTED NOT DETECTED Final   Enterobacteriaceae species NOT DETECTED NOT DETECTED Final   Enterobacter cloacae complex NOT DETECTED NOT DETECTED Final   Escherichia coli NOT DETECTED NOT DETECTED Final   Klebsiella oxytoca NOT DETECTED NOT DETECTED Final   Klebsiella pneumoniae NOT DETECTED NOT DETECTED Final   Proteus species NOT DETECTED NOT DETECTED Final   Serratia marcescens NOT DETECTED NOT DETECTED Final   Haemophilus influenzae NOT DETECTED NOT DETECTED Final   Neisseria meningitidis NOT DETECTED NOT DETECTED Final   Pseudomonas aeruginosa NOT DETECTED NOT DETECTED Final   Candida albicans NOT DETECTED NOT DETECTED Final   Candida glabrata NOT DETECTED NOT DETECTED Final   Candida krusei NOT DETECTED NOT DETECTED Final   Candida parapsilosis NOT DETECTED NOT DETECTED Final   Candida tropicalis NOT DETECTED NOT DETECTED Final    Comment: Performed at [facility], [address] [place],  [address]  Culture, blood (Routine X 2) w Reflex to ID Panel     Status: None  (Preliminary result)  Collection Time: 05/24/17  9:48 AM  Result Value Ref Range Status   Specimen Description BLOOD LEFT ANTECUBITAL  Final   Special Requests   Final    BOTTLES DRAWN AEROBIC AND ANAEROBIC Blood Culture adequate volume   Culture   Final    NO GROWTH 4 DAYS Performed at [facility], [address], [place], [place] [address]    Report Status PENDING  Incomplete  Culture, blood (Routine X 2) w Reflex to ID Panel     Status: None (Preliminary result)   Collection Time: 05/24/17 10:04 AM  Result Value Ref Range Status   Specimen Description BLOOD BLOOD LEFT HAND  Final   Special Requests   Final    BOTTLES DRAWN AEROBIC AND ANAEROBIC Blood Culture adequate volume   Culture   Final    NO GROWTH 4 DAYS Performed at [facility], [address] [place], [place] [address]    Report Status PENDING  Incomplete     Studies/Results: Mr Lumbar Spine W Wo Contrast  Result Date: 05/27/2017 CLINICAL DATA:  Weakness. Altered mental status. Fall. L1 fracture. Chest mass and adenopathy. EXAM: MRI LUMBAR SPINE WITHOUT AND WITH CONTRAST TECHNIQUE: Multiplanar and multiecho pulse sequences of the lumbar spine were obtained without and with intravenous contrast. CONTRAST:  36m MULTIHANCE GADOBENATE DIMEGLUMINE 529 MG/ML IV SOLN COMPARISON:  CT of the lumbar spine 05/13/2014. CT chest 05/23/2017. FINDINGS: Segmentation: 5 non rib-bearing lumbar type vertebral bodies are present. Alignment: AP alignment is anatomic. Straightening of the normal lumbar lordosis is stable. Vertebrae: A pathologic compression fracture is present at L1. There is enhancing component posteriorly at L1 extending into the left pedicle. Edema is present throughout the vertebral body. A remote superior endplate fractures present at T12. Additional signal abnormality enhancement is present in the sacrum. This suggests a second lesion. No other focal marrow signal abnormality is present to suggest  metastatic disease elsewhere. Conus medullaris and cauda equina: Conus extends to the L1 level. Conus and cauda equina appear normal. Paraspinal and other soft tissues: Heterogeneous collections are now present within the psoas musculature bilaterally. This appears to originate at the L1 level. There is no definite enhancement associated with the collections. These are new since the CT scan. Disc levels: Disc signal is preserved.  Disc infection is considered unlikely. T11-12: Mild disc bulging is present without significant stenosis. T12-L1: Retropulsed bone or soft tissue associated with the L1 compression fracture effaces the ventral CSF with moderate central canal narrowing. Mild foraminal narrowing is present bilaterally. L1-2: Retropulsed bone effaces the ventral CSF above this level. Mild foraminal narrowing is present bilaterally. L2-3: Mild disc bulging is present. There is no significant stenosis. L3-4: Mild disc bulging is present there is no significant stenosis. L4-5: A broad-based disc protrusion is present. Mild left subarticular narrowing is present. Foramina are patent bilaterally. L5-S1: A leftward disc protrusion is present without significant stenosis. IMPRESSION: 1. Enhancing lesion within the posterior aspect of the L1 fracture with extraosseous extension suggesting metastatic disease and pathologic fracture. 2. Focal signal abnormality and enhancement at S3 likely also represents focal metastasis. 3. New mixed intensity collections within the psoas musculature bilaterally compatible with hemorrhage. Infection is considered unlikely. 4. Retropulsed bone narrows the spinal canal with mild central canal stenosis at L1. 5. Mild foraminal narrowing bilaterally at T12-L1 and L1-2. Electronically Signed   By: CSan MorelleM.D.   On: 05/27/2017 13:31    Assessment/Plan: ATreniyah Lynnis a 77y.o. female with RA,  recent admission for fall and L1 fx, recent influenza now with MRSA bacteremia  and imaging showing possible RUL mass vs abscess. Has Klebsiella UTI. TTE negative. She has FTT and a R buttocks wound which could be a source of the bacteremia.  FU bcx 2/17 ngtd.  I had initially discussed with her and her son the seriousness of MRSA bacteremia. Discussed need for minimum of 2 weeks IV abx therapy, as well as further evaluation for endocarditis with TEE. Will also need further work up for the RUL mass.  Her son however would like to have her be stronger prior to undergoing the TEE or bronch and I think it is ok to hold on the TEE - can do as otpt if needed.  It would not change the management until we come to the 2 week treatment mark. However I did ask him to consider not delaying the Bronch as if there is a malignancy it will need to be addressed ASAP.   Feb 19 - esr > 140 and crp 20.6 No family at bedside. Feb 20 -remains AF, fu bcx 2/17 neg. Picc placed. 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  2/21 - no fevers, considered bxp of spine but IR states not feasible due to sample needing to be decalcified.  Recommendations Cont vanco for min 2 weeks total from 2/17. Technically she needs a TEE but her son has declined it. She is undergoing work up for her probably malignancy. Will definitely plan on a prolonged tail of oral abx after completing IV vanco and may even extend to full 4-6  week IV course. Stop keflex as has received 7 days treatment for UTI Thank you very much for the consult. Will follow with you.  Leonel Ramsay   05/28/2017, 10:57 AM    pf

## 2017-05-28 NOTE — Progress Notes (Signed)
Physical Therapy Treatment Patient Details Name: Linda Walsh MRN: [id number] DOB: 11-25-1940 Today's Date: 05/28/2017    History of Present Illness Pt admitted for complaints of weakness and now with UTI. Pt with history of cardiomyopathy, RA, depression and recently hospitalized for flue. Of note, pt with recent fall with L1 fracture and R clavicular fx mangaged non-op.    PT Comments    Split session.    8:50-8:58 Pt in bed, breakfast tray arrived.  Back brace was brought from home.  Pt with mod/max a x 1 to get to edge of bed in attempt to get pt up for breakfast.  Upon sitting, pt quickly returned to supine after request to lay back down.  Stated she was in too much pain.  Primary nurse in and gave pain meds.   9:47-10:01 Returned after allowance for pain medication to work.  While she participated as she could in exercises, she refused further EOB or mobility trials.  She is unable to complete LE ROM without assist. Educated on risks/benefits of mobility.   Follow Up Recommendations  SNF     Equipment Recommendations       Recommendations for Other Services       Precautions / Restrictions Precautions Precautions: Fall Required Braces or Orthoses: Other Brace/Splint Other Brace/Splint: TLSO brace for OOB Restrictions Weight Bearing Restrictions: No RUE Weight Bearing: Non weight bearing Other Position/Activity Restrictions: per chart review    Mobility  Bed Mobility Overal bed mobility: Needs Assistance Bed Mobility: Supine to Sit     Supine to sit: Mod assist;Max assist Sit to supine: Mod assist;Max assist      Transfers                    Ambulation/Gait             General Gait Details: refused.  TSLO in room today.   Stairs            Wheelchair Mobility    Modified Rankin (Stroke Patients Only)       Balance Overall balance assessment: Needs assistance Sitting-balance support: Single extremity supported Sitting  balance-Leahy Scale: Poor Sitting balance - Comments: Pt able to maintain sitting balance w/o assist                                    Cognition Arousal/Alertness: Awake/alert Behavior During Therapy: WFL for tasks assessed/performed Overall Cognitive Status: Within Functional Limits for tasks assessed                                        Exercises Other Exercises Other Exercises: supine ther-ex on B LE including ankle pumps, quad sets, SLRs, and hip abd/add. ALl ther-ex performed x 10 reps with min/mod assist. Cues given for sequencing and attention to task.    General Comments        Pertinent Vitals/Pain Pain Assessment: Faces Faces Pain Scale: Hurts whole lot Pain Location: mid back, pelvis Pain Descriptors / Indicators: Aching Pain Intervention(s): Limited activity within patient's tolerance;Premedicated before session    Home Living                      Prior Function            PT Goals (current goals can now be found in  the care plan section) Progress towards PT goals: Not progressing toward goals - comment    Frequency    Min 2X/week      PT Plan Current plan remains appropriate    Co-evaluation              AM-PAC PT "6 Clicks" Daily Activity  Outcome Measure  Difficulty turning over in bed (including adjusting bedclothes, sheets and blankets)?: Unable Difficulty moving from lying on back to sitting on the side of the bed? : Unable Difficulty sitting down on and standing up from a chair with arms (e.g., wheelchair, bedside commode, etc,.)?: Unable Help needed moving to and from a bed to chair (including a wheelchair)?: Total Help needed walking in hospital room?: Total Help needed climbing 3-5 steps with a railing? : Total 6 Click Score: 6    End of Session   Activity Tolerance: Patient limited by pain Patient left: in bed;with bed alarm set;with call bell/phone within reach Nurse Communication:  Mobility status Pain - Right/Left: Right Pain - part of body: Shoulder     Time: 4650-3546 PT Time Calculation (min) (ACUTE ONLY): 28 min  Charges:  $Therapeutic Exercise: 8-22 mins $Therapeutic Activity: 8-22 mins                    G Codes:       Chesley Noon, PTA 05/28/17, 11:57 AM

## 2017-05-29 ENCOUNTER — Inpatient Hospital Stay: Payer: Medicare Other

## 2017-05-29 ENCOUNTER — Encounter: Payer: Self-pay | Admitting: Radiology

## 2017-05-29 LAB — CULTURE, BLOOD (ROUTINE X 2)
CULTURE: NO GROWTH
CULTURE: NO GROWTH
Special Requests: ADEQUATE
Special Requests: ADEQUATE

## 2017-05-29 LAB — CREATININE, SERUM: Creatinine, Ser: 0.47 mg/dL (ref 0.44–1.00)

## 2017-05-29 LAB — VANCOMYCIN, TROUGH: VANCOMYCIN TR: 13 ug/mL — AB (ref 15–20)

## 2017-05-29 LAB — GLUCOSE, CAPILLARY: Glucose-Capillary: 79 mg/dL (ref 65–99)

## 2017-05-29 MED ORDER — VANCOMYCIN HCL IN DEXTROSE 1-5 GM/200ML-% IV SOLN
1000.0000 mg | Freq: Two times a day (BID) | INTRAVENOUS | Status: DC
  Administered 2017-05-29 – 2017-05-31 (×4): 1000 mg via INTRAVENOUS
  Filled 2017-05-29 (×6): qty 200

## 2017-05-29 MED ORDER — FLUDEOXYGLUCOSE F - 18 (FDG) INJECTION
12.2900 | Freq: Once | INTRAVENOUS | Status: AC | PRN
  Administered 2017-05-29: 12.29 via INTRAVENOUS

## 2017-05-29 MED ORDER — MORPHINE SULFATE (PF) 2 MG/ML IV SOLN
2.0000 mg | INTRAVENOUS | Status: DC | PRN
  Administered 2017-05-30 – 2017-06-02 (×13): 2 mg via INTRAVENOUS
  Filled 2017-05-29 (×14): qty 1

## 2017-05-29 MED ORDER — HYDROCOD POLST-CPM POLST ER 10-8 MG/5ML PO SUER
5.0000 mL | Freq: Two times a day (BID) | ORAL | Status: DC | PRN
  Administered 2017-05-29 – 2017-05-31 (×4): 5 mL via ORAL
  Filled 2017-05-29 (×4): qty 5

## 2017-05-29 MED ORDER — FENTANYL 12 MCG/HR TD PT72
12.5000 ug | MEDICATED_PATCH | TRANSDERMAL | Status: DC
  Administered 2017-05-29: 12.5 ug via TRANSDERMAL
  Filled 2017-05-29: qty 1

## 2017-05-29 MED ORDER — MORPHINE SULFATE (PF) 2 MG/ML IV SOLN
1.0000 mg | INTRAVENOUS | Status: AC
  Administered 2017-05-29: 1 mg via INTRAVENOUS
  Filled 2017-05-29: qty 1

## 2017-05-29 NOTE — Progress Notes (Signed)
Daily Progress Note   Patient Name: Linda Walsh       Date: 05/29/2017 DOB: 24-Aug-1940  Age: 77 y.o. MRN#: [id number] Attending Physician: Epifanio Lesches, MD Primary Care Physician: Remi Haggard, FNP Admit Date: 05/22/2017  Reason for Consultation/Follow-up: Establishing goals of care  Subjective:  I have reviewed medical records including EPIC notes, labs and imaging, received report from [facility] NP, assessed the patient and then met at the bedside along with patient and son- Linda Walsh  to discuss diagnosis prognosis, [place], EOL wishes, disposition and options.  I introduced Palliative Medicine as specialized medical care for people living with serious illness. It focuses on providing relief from the symptoms and stress of a serious illness. The goal is to improve quality of life for both the patient and the family.  We discussed a brief life review of the patient. She used to be very independent- loved gardening. Her spouse died in a car accident in 06/07/87. She has been living in assisted living facility for 7 years due to crippling arthritis.  As far as functional and nutritional status- there has been decline over the last few months and significant decline since a fall a few weeks ago resulting in a clavicle fracture. She has not walked since returning to the assisted living facility after the most recent admission. Her appetite has significantly decreased. She has lost weight.     We discussed their current illness and what it means in the larger context of their on-going co-morbidities.  Natural disease trajectory and expectations at EOL were discussed. 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 She is having a great deal of pain in her spine that is relieved minimally and for a small amount of time by morphine 84m. She has suffered for many years with her rheumatoid arthritis.   I attempted to elicit values and goals of care important to the patient.  She is tired. She doesn't want to suffer. She and her son do not want her to go through more diagnostics to find a cancer they would not treat with any aggressive therapies.   The difference between aggressive medical intervention and comfort care was considered in light of the patient's  goals of care. We discussed other life prolonging therapies and consideration of transition to comfort care and allowing current MRSA bacteremia to take its course- discussed option of transition to residential Hospice if this path were chosen. Linda Walsh is going to discuss this with his spouse and discuss with social work Architectural technologist.   Advanced directives, concepts specific to code status, artifical feeding and hydration, and rehospitalization were considered and discussed. Patient and Linda Walsh wish to implement Do Not Resuscitate order- understand this to mean that if patient were to have no pulse and no respirations- no attempts to at CPR and no intubation would be made. No desire for artificial feeding.   Hospice and Palliative Care services outpatient were explained and offered.  Questions and concerns were addressed. The family was encouraged to call with questions or concerns.   Review of Systems  Constitutional: Positive for malaise/fatigue and weight loss.  Musculoskeletal: Positive for back pain.  Neurological: Positive for weakness.  Psychiatric/Behavioral: Negative for depression. The patient is not nervous/anxious and does not have insomnia.     Length of Stay: 7  Current Medications: Scheduled Meds:  . aspirin EC  81 mg Oral Daily  . atorvastatin  20 mg Oral Daily  . budesonide  2 mL  Nebulization Daily  . cholecalciferol  1,000 Units Oral Daily  . collagenase   Topical Daily  . enoxaparin (LOVENOX) injection  40 mg Subcutaneous Q24H  . feeding supplement (ENSURE ENLIVE)  237 mL Oral BID BM  . ferrous sulfate  325 mg Oral BID WC  . hydroxychloroquine  400 mg Oral Daily  . loratadine  10 mg Oral Daily  . LORazepam  1 mg Oral QPM  . Melatonin  5 mg Oral QHS  . metaxalone  800 mg Oral TID  . metoprolol succinate  50 mg Oral Daily  . mirtazapine  30 mg Oral QHS  . mometasone-formoterol  2 puff Inhalation BID  . multivitamin with minerals  1 tablet Oral Daily  . pantoprazole  40 mg Oral Daily  . potassium chloride  20 mEq Oral Daily  . rOPINIRole  0.5 mg Oral QHS  . senna-docusate  1 tablet Oral BID  . sodium chloride flush  10-40 mL Intracatheter Q12H    Continuous Infusions: . vancomycin 1,000 mg (05/29/17 1450)    PRN Meds: acetaminophen **OR** acetaminophen, albuterol, guaiFENesin-dextromethorphan, morphine injection, ondansetron **OR** ondansetron (ZOFRAN) IV, sodium chloride flush, traMADol  Physical Exam  Constitutional:  Frail, cachetic  Cardiovascular: Normal rate.  Pulmonary/Chest: Effort normal.  cough  Neurological:  lethargic  Skin: Skin is warm.  Nursing note and vitals reviewed.           Vital Signs: BP (!) 153/88 (BP Location: Right Arm)   Pulse (!) 111   Temp 99.5 F (37.5 C) (Oral)   Resp 18   Ht 4' 8"  (1.422 m)   Wt 47.6 kg (105 lb)   SpO2 96%   BMI 23.54 kg/m  SpO2: SpO2: 96 % O2 Device: O2 Device: Not Delivered O2 Flow Rate:    Intake/output summary:   Intake/Output Summary (Last 24 hours) at 05/29/2017 1527 Last data filed at 05/29/2017 0640 Gross per 24 hour  Intake -  Output 450 ml  Net -450 ml   LBM: Last BM Date: 05/20/17 Baseline Weight: Weight: 47.6 kg (105 lb) Most recent weight: Weight: 47.6 kg (105 lb)       Palliative Assessment/Data: PPS: 20%   Flowsheet Rows     Most Recent Value  Intake Tab    Referral Department  Hospitalist  Unit at Time of Referral  Med/Surg Unit  Palliative Care Primary Diagnosis  Cardiac  Date Notified  05/27/17  Palliative Care Type  New Palliative care  Reason for referral  Clarify Goals of Care  Date of Admission  05/22/17  Date first seen by Palliative Care  05/28/17  # of days Palliative referral response time  1 Day(s)  # of days IP prior to Palliative referral  5  Clinical Assessment  Psychosocial & Spiritual Assessment  Palliative Care Outcomes      Patient Active Problem List   Diagnosis Date Noted  . Lung mass   . Pressure injury of skin 05/23/2017  . MRSA bacteremia 05/23/2017  . UTI (urinary tract infection) 05/22/2017  . L1 vertebral fracture ([place]) 05/13/2017    Palliative Care Assessment & Plan   Patient Profile: Patient is 77 yo female with hx of takasubo cardiomyopathy, RA, depression, CHF, admitted on 05/22/17 with increasing weakness, lethargy, poor po intake. This is second admission this month. Recently discharged for admission due to influenza, and fall resulting in clavicle and L1 compression fracture. This admission workup reveals MRSA bacteremia, UTI, new lung bronchiogenic lung mass, PET scan shows uptake in R and L lung, paratracheal lymph node, and spine indicating likely carcinoma with metastasis. Palliative medicine consulted for [place].   Assessment/Recommendations/Plan   No further diagnostics for likely metastatic lung cancer and patient and family agree would not aggressively treat- would prefer to treat symptomatically  DNR code status  Family considering transitioning all care to focus on full comfort care and requesting residential Hospice. If they decide to stop IV antibiotics and focus only on symptom management they will notify RN and can contact social work to arrange  Will start fentanyl patch 12.5 to assist with pain management  Increase prn morphine to 55m q2hr prn for breakthrough pain  Tussionex  q12hr prn for cough  Minimize medications to reduce pill burden- will d/c multivitamins and maintenance medications that are not affecting patient's overall long term trajectory or comfort   Goals of Care and Additional Recommendations:  Limitations on Scope of Treatment: Minimize Medications, No Chemotherapy, No Diagnostics, No Radiation and No Surgical Procedures  Code Status:  DNR  Prognosis:   < 3 months - if transition to comfort care is made in setting of MRSA bacteremia then prognosis would be reduced to weeks  Discharge Planning:  To Be Determined- SNF with Hospice vs Residential Hospice  Care plan was discussed with patient and son- RAudry Walsh  Thank you for allowing the Palliative Medicine Team to assist in the care of this patient.   Time In: 1415 Time Out: 1530 Total Time 75 mins Prolonged Time Billed Yes      Greater than 50%  of this time was spent counseling and coordinating care related to the above assessment and plan.  KMariana Kaufman AGNP-C Palliative Medicine   Please contact Palliative Medicine Team phone at [phone] questions and concerns.

## 2017-05-29 NOTE — Progress Notes (Signed)
LCSW continues to follow for disposition/discharge planning once patient is medically stable.  Currently plan is for patient to return to [facility] at discharge. Medical work up continues to be pursued. [employer] Medicare Auth has been received for return, however most likely will need new auth prior to DC.  Facility has been updated regarding patient's current progress and plans. No other needs at this time.  Will continue to follow and assist as needed.  Lane Hacker, MSW Clinical Social Work: [employer] Coverage for :  [phone]

## 2017-05-29 NOTE — Progress Notes (Signed)
Pharmacy Antibiotic Note  Linda Walsh is a 77 y.o. female admitted on 05/22/2017 with MRSA bacteremia.  Pharmacy has been consulted for vancomycin dosing.  Currently ordered Vancomycin 750mg  IV q12h. 2/22 10:00 Vancomycin trough resulted at 13 mcg/ml  Goal trough is 15-20 mcg/ml  Plan: Will increase to Vancomycin 1g IV q12h. Recheck trough level prior to 5th dose.  Height: 4\' 8"  (142.2 cm) Weight: 105 lb (47.6 kg) IBW/kg (Calculated) : 36.3  Temp (24hrs), Avg:98.8 F (37.1 C), Min:98.2 F (36.8 C), Max:99.5 F (37.5 C)  Recent Labs  Lab 05/22/17 1249 05/22/17 1414 05/23/17 0331  05/25/17 0242 05/26/17 0419 05/27/17 0306 05/27/17 1004 05/28/17 0303 05/29/17 0316 05/29/17 0951  WBC 12.0*  --  11.1*  --   --   --  6.8  --   --   --   --   CREATININE 0.47  --  0.35*   < > 0.31* 0.53 0.39*  --  0.37* 0.47  --   LATICACIDVEN  --  1.3  --   --   --   --   --   --   --   --   --   VANCOTROUGH  --   --   --   --   --   --   --  6*  --   --  13*   < > = values in this interval not displayed.    Estimated Creatinine Clearance: 38.5 mL/min (by C-G formula based on SCr of 0.47 mg/dL).    No Known Allergies  Antimicrobials this admission: vancomycin 2/17 >>  ceftriaxone 2/15 >> 2/16  Dose adjustments this admission: 2/20 increased to 750mg  IV q12h 2/22 increased to 1g IV q12h  Microbiology results: 2/15 BCx: 4/4 MRSA  2/15 UCx: Sent   Thank you for allowing pharmacy to be a part of this patient's care.  Paulina Fusi, PharmD, BCPS 05/29/2017 12:25 PM

## 2017-05-29 NOTE — Progress Notes (Signed)
Linda Walsh   DOB:Apr 01, 1941   OT#:[id number]    Subjective: Patient complains of back pain.  Appetite is poor.  No fevers.  Overall feels poorly.  Objective:  Vitals:   05/29/17 2006 05/29/17 2036  BP: 138/70   Pulse: (!) 121 (!) 120  Resp: 17 18  Temp: 98.6 F (37 C)   SpO2: 96% 95%     Intake/Output Summary (Last 24 hours) at 05/30/2017 0032 Last data filed at 05/29/2017 2009 Gross per 24 hour  Intake -  Output 900 ml  Net -900 ml    GENERAL Alert, no distress and comfortable. O2 cachectic appearing. EYES: no pallor or icterus OROPHARYNX: no thrush or ulceration. NECK: supple, no masses felt LYMPH:  no palpable lymphadenopathy in the cervical, axillary or inguinal regions LUNGS: decreased breath sounds to auscultation at bases and  No wheeze or crackles HEART/CVS: regular rate & rhythm and no murmurs; No lower extremity edema ABDOMEN: abdomen soft, tender  on deep palpation. and normal bowel sounds Musculoskeletal:no cyanosis of digits and no clubbing; multiple deformities of the hands and toes secondary to rheumatoid arthritis. PSYCH: alert & oriented x 3 with fluent speech NEURO: no focal motor/sensory deficits SKIN:  no rashes or significant lesions   Labs:  Lab Results  Component Value Date   WBC 6.8 05/27/2017   HGB 10.6 (L) 05/27/2017   HCT 31.0 (L) 05/27/2017   MCV 90.9 05/27/2017   PLT 424 05/27/2017   NEUTROABS 7.8 (H) 05/13/2017    Lab Results  Component Value Date   NA 130 (L) 05/28/2017   K 3.8 05/28/2017   CL 94 (L) 05/28/2017   CO2 26 05/28/2017    Studies:  Nm Pet Image Initial (pi) Skull Base To Thigh  Result Date: 05/29/2017 CLINICAL DATA:  Initial treatment strategy for right upper lobe mass with satellite nodules. Possible pathologic fracture at L1. EXAM: NUCLEAR MEDICINE PET SKULL BASE TO THIGH TECHNIQUE: 12.3 mCi F-18 FDG was injected intravenously. Full-ring PET imaging was performed from the skull base to thigh after the radiotracer. CT  data was obtained and used for attenuation correction and anatomic localization. FASTING BLOOD GLUCOSE:  Value: 79 mg/dl COMPARISON:  Multiple exams, including 05/23/2017 CT scan and 05/27/2017 MRI FINDINGS: NECK Accentuated activity along the cerebellar vermis on the top most images is likely artifactual. There is no lesion in this vicinity on prior head CT dated 05/13/2017. No hypermetabolic lymph nodes in the neck. Right mastoid effusion with fluid filling all visible air cells. Acute sphenoid sinusitis. Chronic left maxillary sinusitis with frothy material in the sinus. Bilateral carotid atherosclerotic calcification. CHEST 3.9 by 2.9 cm right upper lobe mass abutting the mediastinal margin is hypermetabolic, especially along its periphery, maximum SUV 10.7. The adjacent satellite nodules are also hypermetabolic with maximum SUV 6.9. There is atelectasis of the left lower lobe with an adjacent pleural effusion. Small amount of accentuated metabolic activity inferomedially along the left lower lobe with maximum SUV 4.7, uncertain significance. There are multifocal accentuated regions of activity in the esophagus. In the upper esophagus, maximum SUV is 6.6. Focal mid esophageal activity maximum SUV 4.5. Distal esophageal activity maximum SUV 4.9. There can be some overlap of physiological and malignant esophageal activity in this range. Right lower paratracheal 1.0 cm in short axis lymph node with maximum SUV 3.1. Background mediastinal blood pool activity 2.5. Coronary, aortic arch, and branch vessel atherosclerotic vascular disease. ABDOMEN/PELVIS No abnormal hypermetabolic activity within the liver, pancreas, adrenal glands, or spleen. No hypermetabolic  lymph nodes in the abdomen or pelvis. Aortoiliac atherosclerotic vascular disease. Psoas muscle abnormalities discussed under the skeletal section below. Low-grade presacral edema. Mild prominence of stool in the rectal vault. SKELETON Eccentric to the left at  the C4-5 intervertebral level there is accentuated metabolic activity with maximum SUV 6.3. Looking back at the cervical spine CT from 05/13/2017 even in retrospect I do not see evidence of a metastatic lesion or other significant findings aside from spondylosis. Cause of this accentuated activity is uncertain. There is accentuated activity associated with an acute fracture the right distal clavicle, maximum SUV 6.4. This is most likely a benign fracture. Although there is some lucency in the bone this is probably from early hyperemic healing response. Probable fracture of the right third rib anteriorly with some mild irregularity of the costochondral junction, and accentuated metabolic activity with maximum SUV 3.4. There is abnormal accentuated metabolic activity posteriorly in the L1 vertebral body with maximum SUV 8.0. In addition, there is some focal accentuated activity centrally in the T12 vertebral body with maximum SUV 8.1, this did not have a correlate of finding on the MRI aside perhaps from some superior endplate chronic compression at T12. There is some faint focal accentuated metabolic activity centrally in the L2 vertebral body, maximum SUV 5.6. At the L1 level there is notable paraspinal accentuated metabolic activity tracking down along the psoas muscles. There faint dependent calcifications in the psoas muscles which were not present on the CT scan of 05/13/2017, this may represent dystrophic calcification. Presumably the paraspinal and muscular activity may represent acute muscle injury and healing response, although clearly entity such as infection or even tumor cannot be readily excluded given the high metabolic activity. There is some central lower activity in the psoas muscles along the known collections. There is some vague metabolic activity in the sacrum, likely associated with the patient's suspected subtle sacral fracture, maximum SUV is approximately 3.6. Presacral stranding noted.  IMPRESSION: 1. 3.9 cm hypermetabolic right upper lobe centrally necrotic mass abutting the mediastinum, with hypermetabolic adjacent satellite nodules in the right upper lobe. Mildly hypermetabolic right paratracheal lymph node with maximum SUV 3.1. 2. Separate bandlike focus of abnormal metabolic activity medially in the left lower lobe is suspicious for potential metastatic disease. However, the entire left lower lobe is atelectatic and there is an adjacent small left pleural effusion, and the activity medially in the left lower lobe is fairly low-grade. 3. Hypermetabolic activity posteriorly along the L1 compression fracture in the vicinity of concern on prior MRI, suspicious for osseous metastatic spread. That said, there is also prominent metabolic activity in the paraspinal tissues and tracking along the psoas collections, and the distribution would be unusual for tumor spreading in the psoas muscles. There is some new calcifications along the psoas muscle cavities. The appearance could reflect hematoma with some organizing dystrophic calcifications and associated psoas muscle injuries contributing to the hypermetabolic activity, or conceivably psoas infection. Accentuated osseous metabolic activity associated with several fractures including the right distal clavicle, right anterior third rib, and sacrum. 4. There are several foci of accentuated bony activity of uncertain significance. Specifically, on the left at C4-5 there is high metabolic activity but there was not a visible fracture or obvious lesion on CT scan of this region dated 05/13/2017. Similarly, there is accentuated metabolic activity centrally in the T12 and L2 vertebral bodies without appreciable corresponding lesion on the MRI from 05/27/2017. The cause of these other regions of accentuated metabolic activity is uncertain given  the lack of corroborating findings on the other imaging studies. 5. Multifocal activity in the esophagus, probably  physiologic in this setting, less likely to be from esophageal neoplasm. 6. Other imaging findings of potential clinical significance: Right mastoid effusion. Acute sphenoid sinusitis. Chronic left maxillary sinusitis. Aortic Atherosclerosis (ICD10-I70.0). Coronary atherosclerosis. Electronically Signed   By: Van Clines M.D.   On: 05/29/2017 13:43    Assessment & Plan:   #77 year old female patient with long-standing rheumatoid arthritis is currently admitted the hospital for MRSA bacteremia/noted to have a lung mass.  # Lung mass right upper lobe-highly concerning for malignancy/also L1 pathologic fracture associated mass.  PET scan shows primary lung mass; mediastinal adenopathy; L1 lesion.  Highly suggestive of metastatic lung cancer.  # L1 pathologic fracture-again highly suspicious of malignancy; patient might benefit from radiation/kyphoplasty-biopsy.  See discussion below  # MRSA bacteremia/vancomycin-followed by ID.  # Overall prognosis is poor. Progress note from palliative care is noted-patient/family not interested in aggressive measures-including biopsies.  If not interested in aggressive measures hospice/palliative care would be reasonable.   However, if patient/family interested in pursuing diagnostic workup-oncology will be willing to coordinate with IR for biopsy.   Dr. Tasia Catchings is on call over the weekend for any urgent questions.   Cammie Sickle, MD 05/30/2017  12:32 AM

## 2017-05-29 NOTE — Progress Notes (Signed)
[facility] NAME: Linda Walsh    MR#:  [id number]  DATE OF BIRTH:  Jul 05, 1940  SUBJECTIVE: Admitted for failure to thrive, weakness and found to have UTI, hypokalemia.  Patient found to have MRSA bacteremia,.  Possible bronchogenic carcinoma possible bone metastasis.  Patient had a PET scan today with back pain she is on morphine.  Still has poor p.o. intake.  CHIEF COMPLAINT:   Chief Complaint  Patient presents with  . Failure To Thrive    REVIEW OF SYSTEMS:   ROS CONSTITUTIONAL: Cough, complains of back pain EYES: No blurred or double vision.  EARS, NOSE, AND THROAT: No tinnitus or ear pain.  RESPIRATORY: Cough,  No shortness of breath, wheezing or hemoptysis.  CARDIOVASCULAR: No chest pain, orthopnea, edema.  GASTROINTESTINAL: No nausea, vomiting, diarrhea or abdominal pain.  Poor p.o. intake GENITOURINARY: No dysuria, hematuria.  ENDOCRINE: No polyuria, nocturia,  HEMATOLOGY: No anemia, easy bruising or bleeding SKIN: Small decubiti on the right buttock MUSCULOSKELETAL: Diffuse joint pains NEUROLOGIC: No tingling, numbness, weakness.  PSYCHIATRY: No anxiety or depression.   DRUG ALLERGIES:  No Known Allergies  VITALS:  Blood pressure (!) 153/88, pulse (!) 111, temperature 99.5 F (37.5 C), temperature source Oral, resp. rate 18, height 4\' 8"  (1.422 m), weight 47.6 kg (105 lb), SpO2 96 %.  PHYSICAL EXAMINATION:  GENERAL:  77 y.o.-year-old patient lying in the bed with no acute distress.  Very  Frail individual EYES: Pupils equal, round, reactive to light.No scleral icterus. Extraocular muscles intact.  HEENT: Head atraumatic, normocephalic. Oropharynx and nasopharynx clear.  NECK:  Supple, no jugular venous distention. No thyroid enlargement, no tenderness.  LUNGS: Normal breath sounds bilaterally, no wheezing, rales,rhonchi or crepitation. No use of accessory muscles of respiration.  CARDIOVASCULAR: S1, S2  tachycardic.Marland Kitchen No murmurs, rubs, or gallops.  ABDOMEN: Soft, nontender, nondistended. Bowel sounds present. No organomegaly or mass.  EXTREMITIES: No pedal edema, cyanosis, or clubbing.  NEUROLOGIC: Cranial nerves II through XII are intact. Muscle strength 5/5 in all extremities. Sensation intact. Gait not checked.  PSYCHIATRIC: The patient is alert and oriented x 3.  SKIN: Sacral decubiti on the right buttock with some eschar in the center, slight pus around the periphery but patient has no tenderness the size around 3-2 cm.  Consult wound care for staging, dressing recommendations.   LABORATORY PANEL:   CBC Recent Labs  Lab 05/27/17 0306  WBC 6.8  HGB 10.6*  HCT 31.0*  PLT 424   ------------------------------------------------------------------------------------------------------------------  Chemistries  Recent Labs  Lab 05/27/17 0306 05/28/17 0303 05/29/17 0316  NA 132* 130*  --   K 3.9 3.8  --   CL 97* 94*  --   CO2 27 26  --   GLUCOSE 96 128*  --   BUN 8 8  --   CREATININE 0.39* 0.37* 0.47  CALCIUM 7.7* 8.0*  --   MG 1.9  --   --    ------------------------------------------------------------------------------------------------------------------  Cardiac Enzymes No results for input(s): TROPONINI in the last 168 hours. ------------------------------------------------------------------------------------------------------------------  RADIOLOGY:  Nm Pet Image Initial (pi) Skull Base To Thigh  Result Date: 05/29/2017 CLINICAL DATA:  Initial treatment strategy for right upper lobe mass with satellite nodules. Possible pathologic fracture at L1. EXAM: NUCLEAR MEDICINE PET SKULL BASE TO THIGH TECHNIQUE: 12.3 mCi F-18 FDG was injected intravenously. Full-ring PET imaging was performed from the skull base to thigh after the radiotracer. CT data was obtained and used for attenuation  correction and anatomic localization. FASTING BLOOD GLUCOSE:  Value: 79 mg/dl COMPARISON:   Multiple exams, including 05/23/2017 CT scan and 05/27/2017 MRI FINDINGS: NECK Accentuated activity along the cerebellar vermis on the top most images is likely artifactual. There is no lesion in this vicinity on prior head CT dated 05/13/2017. No hypermetabolic lymph nodes in the neck. Right mastoid effusion with fluid filling all visible air cells. Acute sphenoid sinusitis. Chronic left maxillary sinusitis with frothy material in the sinus. Bilateral carotid atherosclerotic calcification. CHEST 3.9 by 2.9 cm right upper lobe mass abutting the mediastinal margin is hypermetabolic, especially along its periphery, maximum SUV 10.7. The adjacent satellite nodules are also hypermetabolic with maximum SUV 6.9. There is atelectasis of the left lower lobe with an adjacent pleural effusion. Small amount of accentuated metabolic activity inferomedially along the left lower lobe with maximum SUV 4.7, uncertain significance. There are multifocal accentuated regions of activity in the esophagus. In the upper esophagus, maximum SUV is 6.6. Focal mid esophageal activity maximum SUV 4.5. Distal esophageal activity maximum SUV 4.9. There can be some overlap of physiological and malignant esophageal activity in this range. Right lower paratracheal 1.0 cm in short axis lymph node with maximum SUV 3.1. Background mediastinal blood pool activity 2.5. Coronary, aortic arch, and branch vessel atherosclerotic vascular disease. ABDOMEN/PELVIS No abnormal hypermetabolic activity within the liver, pancreas, adrenal glands, or spleen. No hypermetabolic lymph nodes in the abdomen or pelvis. Aortoiliac atherosclerotic vascular disease. Psoas muscle abnormalities discussed under the skeletal section below. Low-grade presacral edema. Mild prominence of stool in the rectal vault. SKELETON Eccentric to the left at the C4-5 intervertebral level there is accentuated metabolic activity with maximum SUV 6.3. Looking back at the cervical spine CT from  05/13/2017 even in retrospect I do not see evidence of a metastatic lesion or other significant findings aside from spondylosis. Cause of this accentuated activity is uncertain. There is accentuated activity associated with an acute fracture the right distal clavicle, maximum SUV 6.4. This is most likely a benign fracture. Although there is some lucency in the bone this is probably from early hyperemic healing response. Probable fracture of the right third rib anteriorly with some mild irregularity of the costochondral junction, and accentuated metabolic activity with maximum SUV 3.4. There is abnormal accentuated metabolic activity posteriorly in the L1 vertebral body with maximum SUV 8.0. In addition, there is some focal accentuated activity centrally in the T12 vertebral body with maximum SUV 8.1, this did not have a correlate of finding on the MRI aside perhaps from some superior endplate chronic compression at T12. There is some faint focal accentuated metabolic activity centrally in the L2 vertebral body, maximum SUV 5.6. At the L1 level there is notable paraspinal accentuated metabolic activity tracking down along the psoas muscles. There faint dependent calcifications in the psoas muscles which were not present on the CT scan of 05/13/2017, this may represent dystrophic calcification. Presumably the paraspinal and muscular activity may represent acute muscle injury and healing response, although clearly entity such as infection or even tumor cannot be readily excluded given the high metabolic activity. There is some central lower activity in the psoas muscles along the known collections. There is some vague metabolic activity in the sacrum, likely associated with the patient's suspected subtle sacral fracture, maximum SUV is approximately 3.6. Presacral stranding noted. IMPRESSION: 1. 3.9 cm hypermetabolic right upper lobe centrally necrotic mass abutting the mediastinum, with hypermetabolic adjacent  satellite nodules in the right upper lobe. Mildly hypermetabolic right  paratracheal lymph node with maximum SUV 3.1. 2. Separate bandlike focus of abnormal metabolic activity medially in the left lower lobe is suspicious for potential metastatic disease. However, the entire left lower lobe is atelectatic and there is an adjacent small left pleural effusion, and the activity medially in the left lower lobe is fairly low-grade. 3. Hypermetabolic activity posteriorly along the L1 compression fracture in the vicinity of concern on prior MRI, suspicious for osseous metastatic spread. That said, there is also prominent metabolic activity in the paraspinal tissues and tracking along the psoas collections, and the distribution would be unusual for tumor spreading in the psoas muscles. There is some new calcifications along the psoas muscle cavities. The appearance could reflect hematoma with some organizing dystrophic calcifications and associated psoas muscle injuries contributing to the hypermetabolic activity, or conceivably psoas infection. Accentuated osseous metabolic activity associated with several fractures including the right distal clavicle, right anterior third rib, and sacrum. 4. There are several foci of accentuated bony activity of uncertain significance. Specifically, on the left at C4-5 there is high metabolic activity but there was not a visible fracture or obvious lesion on CT scan of this region dated 05/13/2017. Similarly, there is accentuated metabolic activity centrally in the T12 and L2 vertebral bodies without appreciable corresponding lesion on the MRI from 05/27/2017. The cause of these other regions of accentuated metabolic activity is uncertain given the lack of corroborating findings on the other imaging studies. 5. Multifocal activity in the esophagus, probably physiologic in this setting, less likely to be from esophageal neoplasm. 6. Other imaging findings of potential clinical significance:  Right mastoid effusion. Acute sphenoid sinusitis. Chronic left maxillary sinusitis. Aortic Atherosclerosis (ICD10-I70.0). Coronary atherosclerosis. Electronically Signed   By: Van Clines M.D.   On: 05/29/2017 13:43    EKG:   Orders placed or performed during the hospital encounter of 05/22/17  . ED EKG  . ED EKG  . EKG 12-Lead  . EKG 12-Lead  . EKG    ASSESSMENT AND PLAN:  77 year old female with past medical history of rheumatoid arthritis, recent L1 fracture, right clavicle fracture managed nonoperatively and also has asthma, anxiety, restless leg syndrome comes from group home because of generalized weakness, poor p.o. intake and found to have severe hypokalemia, UTI. #1 .MRSA bacteremia: Patient is on vancomycin, repeat blood cultures have been negative for 48 hrs. from February 17.  Patient did get a PICC line for 2 weeks of antibiotics  With  vancomycin.   echocardiogram shows EF around 55-60% without any vegetations.  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  Patient is seen by oncology, pulmonary, ID.  High risk for bronchoscopy due to bacteremia and the location of the tumor close to the trachea.  Low yield for biopsy results by bone biopsy for molecular testing.  So patient got PET scan this morning.  Results are pending.,  Patient getting workup for cancer.  2.  Severe hypokalemia secondary to poor p.o. intake.  Pharmacy consulted for  ELECTROLYTE  management.  Hypokalemia improving. Continue IV hydration with poor p.o. intake.  3.  Generalized weakness: Get physical therapy consulted, patient now is getting further workup for cancer. 4.  Hyperlipidemia: Continue atorvastatin. 5.  Asthma: No wheezing.  But  does have cough.  Continue  bronchodilators, Tussionex.,  Further workup for possible bronchogenic carcinoma/abcess including pulmonary consult, oncology consult  6.  History of rheumatoid arthritis: Continue Plaquenil.,  Patient is on rest home because of her severe rheumatoid arthritis. 7.  Restless leg syndrome: Continue Requip. Essential hypertension and now has tachycardia continue Toprol-XL. #8/. pressure ulcer on the right gluteal region: C wound care consulted, recommended daily dressing changes a with Santyl. #9 Klebsiella UTI: Patient is on Rocephin.  changed to Keflex stop date the 21st. 10 #10 possible metastatic lung cancer: Patient is unaware of results of CT chest, MRI of the spine.  Appreciate oncology calling him and discussing the results and also further treatment plan.  Unable to have bone biopsy due to inadequate sampling that will be available after the bone biopsy so patient needs EBUS/PET scan.  Not a candidate for aggressive interventions like chemotherapy due to her frail status, failure to thrive.  #11 .patient told us that she does not want to be kept alive by artificial means like CPR but wanst intubation.  . Patient son is being informed by oncology and also ID, #2 low back pain patient is getting morphine for the back pain continue that.  All the records are reviewed and case discussed with Care Management/Social Workerr. Management plans discussed with the patient, family and they are in agreement.  CODE STATUS: partial  TOTAL TIME TAKING CARE OF THIS PATIENT: 35 minutes.   Prognosis poor, high risk for cardiac arrest.  Epifanio Lesches M.D on 05/29/2017 at 2:18 PM  Between 7am to 6pm - Pager - [phone]  After 6pm go to [email or web address] - password [email or web address] [facility]  Office  [phone]  CC: Primary care physician; Remi Haggard, FNP   Note: This dictation was prepared with Dragon dictation along with smaller phrase technology. Any transcriptional  errors that result from this process are unintentional.

## 2017-05-30 LAB — BASIC METABOLIC PANEL
ANION GAP: 13 (ref 5–15)
BUN: 16 mg/dL (ref 6–20)
CALCIUM: 8 mg/dL — AB (ref 8.9–10.3)
CO2: 24 mmol/L (ref 22–32)
Chloride: 93 mmol/L — ABNORMAL LOW (ref 101–111)
Creatinine, Ser: 0.96 mg/dL (ref 0.44–1.00)
GFR, EST NON AFRICAN AMERICAN: 56 mL/min — AB (ref >60)
Glucose, Bld: 107 mg/dL — ABNORMAL HIGH (ref 65–99)
Potassium: 3.5 mmol/L (ref 3.5–5.1)
Sodium: 130 mmol/L — ABNORMAL LOW (ref 135–145)

## 2017-05-30 NOTE — Progress Notes (Signed)
[facility] NAME: Linda Walsh    MR#:  [id number]  DATE OF BIRTH:  1940/10/24  CHIEF COMPLAINT:     Chief Complaint  Patient presents with  . Failure To Thrive   Continues to have pain in different parts of her body  REVIEW OF SYSTEMS:   ROS CONSTITUTIONAL: Cough, complains of back pain EYES: No blurred or double vision.  EARS, NOSE, AND THROAT: No tinnitus or ear pain.  RESPIRATORY: Cough,  No shortness of breath, wheezing or hemoptysis.  CARDIOVASCULAR: No chest pain, orthopnea, edema.  GASTROINTESTINAL: No nausea, vomiting, diarrhea or abdominal pain.  Poor p.o. intake GENITOURINARY: No dysuria, hematuria.  ENDOCRINE: No polyuria, nocturia,  HEMATOLOGY: No anemia, easy bruising or bleeding SKIN: Small decubiti on the right buttock MUSCULOSKELETAL: Diffuse joint pains NEUROLOGIC: No tingling, numbness, weakness.  PSYCHIATRY: No anxiety or depression.   DRUG ALLERGIES:  No Known Allergies  VITALS:  Blood pressure 139/79, pulse (!) 122, temperature 99 F (37.2 C), temperature source Oral, resp. rate 19, height 4\' 8"  (1.422 m), weight 105 lb (47.6 kg), SpO2 94 %.  PHYSICAL EXAMINATION:  GENERAL:  77 y.o.-year-old patient lying in the bed with no acute distress.  Very  Frail individual EYES: Pupils equal, round, reactive to light.No scleral icterus. Extraocular muscles intact.  HEENT: Head atraumatic, normocephalic. Oropharynx and nasopharynx clear.  NECK:  Supple, no jugular venous distention. No thyroid enlargement, no tenderness.  LUNGS: Normal breath sounds bilaterally, no wheezing, rales,rhonchi or crepitation. No use of accessory muscles of respiration.  CARDIOVASCULAR: S1, S2 tachycardic.Marland Kitchen No murmurs, rubs, or gallops.  ABDOMEN: Soft, nontender, nondistended. Bowel sounds present. No organomegaly or mass.  EXTREMITIES: No pedal edema, cyanosis, or clubbing.  NEUROLOGIC: Cranial nerves II through XII are  intact. Muscle strength 5/5 in all extremities. Sensation intact. Gait not checked.  PSYCHIATRIC: The patient is alert and oriented x 3.  SKIN: Sacral decubiti on the right buttock with some eschar in the center, slight pus around the periphery but patient has no tenderness the size around 3-2 cm.  Consult wound care for staging, dressing recommendations.   LABORATORY PANEL:   CBC Recent Labs  Lab 05/27/17 0306  WBC 6.8  HGB 10.6*  HCT 31.0*  PLT 424   ------------------------------------------------------------------------------------------------------------------  Chemistries  Recent Labs  Lab 05/27/17 0306  05/30/17 0408  NA 132*   < > 130*  K 3.9   < > 3.5  CL 97*   < > 93*  CO2 27   < > 24  GLUCOSE 96   < > 107*  BUN 8   < > 16  CREATININE 0.39*   < > 0.96  CALCIUM 7.7*   < > 8.0*  MG 1.9  --   --    < > = values in this interval not displayed.   ------------------------------------------------------------------------------------------------------------------  Cardiac Enzymes No results for input(s): TROPONINI in the last 168 hours. ------------------------------------------------------------------------------------------------------------------  RADIOLOGY:  Nm Pet Image Initial (pi) Skull Base To Thigh  Result Date: 05/29/2017 CLINICAL DATA:  Initial treatment strategy for right upper lobe mass with satellite nodules. Possible pathologic fracture at L1. EXAM: NUCLEAR MEDICINE PET SKULL BASE TO THIGH TECHNIQUE: 12.3 mCi F-18 FDG was injected intravenously. Full-ring PET imaging was performed from the skull base to thigh after the radiotracer. CT data was obtained and used for attenuation correction and anatomic localization. FASTING BLOOD GLUCOSE:  Value: 79 mg/dl COMPARISON:  Multiple exams, including 05/23/2017  CT scan and 05/27/2017 MRI FINDINGS: NECK Accentuated activity along the cerebellar vermis on the top most images is likely artifactual. There is no lesion in  this vicinity on prior head CT dated 05/13/2017. No hypermetabolic lymph nodes in the neck. Right mastoid effusion with fluid filling all visible air cells. Acute sphenoid sinusitis. Chronic left maxillary sinusitis with frothy material in the sinus. Bilateral carotid atherosclerotic calcification. CHEST 3.9 by 2.9 cm right upper lobe mass abutting the mediastinal margin is hypermetabolic, especially along its periphery, maximum SUV 10.7. The adjacent satellite nodules are also hypermetabolic with maximum SUV 6.9. There is atelectasis of the left lower lobe with an adjacent pleural effusion. Small amount of accentuated metabolic activity inferomedially along the left lower lobe with maximum SUV 4.7, uncertain significance. There are multifocal accentuated regions of activity in the esophagus. In the upper esophagus, maximum SUV is 6.6. Focal mid esophageal activity maximum SUV 4.5. Distal esophageal activity maximum SUV 4.9. There can be some overlap of physiological and malignant esophageal activity in this range. Right lower paratracheal 1.0 cm in short axis lymph node with maximum SUV 3.1. Background mediastinal blood pool activity 2.5. Coronary, aortic arch, and branch vessel atherosclerotic vascular disease. ABDOMEN/PELVIS No abnormal hypermetabolic activity within the liver, pancreas, adrenal glands, or spleen. No hypermetabolic lymph nodes in the abdomen or pelvis. Aortoiliac atherosclerotic vascular disease. Psoas muscle abnormalities discussed under the skeletal section below. Low-grade presacral edema. Mild prominence of stool in the rectal vault. SKELETON Eccentric to the left at the C4-5 intervertebral level there is accentuated metabolic activity with maximum SUV 6.3. Looking back at the cervical spine CT from 05/13/2017 even in retrospect I do not see evidence of a metastatic lesion or other significant findings aside from spondylosis. Cause of this accentuated activity is uncertain. There is  accentuated activity associated with an acute fracture the right distal clavicle, maximum SUV 6.4. This is most likely a benign fracture. Although there is some lucency in the bone this is probably from early hyperemic healing response. Probable fracture of the right third rib anteriorly with some mild irregularity of the costochondral junction, and accentuated metabolic activity with maximum SUV 3.4. There is abnormal accentuated metabolic activity posteriorly in the L1 vertebral body with maximum SUV 8.0. In addition, there is some focal accentuated activity centrally in the T12 vertebral body with maximum SUV 8.1, this did not have a correlate of finding on the MRI aside perhaps from some superior endplate chronic compression at T12. There is some faint focal accentuated metabolic activity centrally in the L2 vertebral body, maximum SUV 5.6. At the L1 level there is notable paraspinal accentuated metabolic activity tracking down along the psoas muscles. There faint dependent calcifications in the psoas muscles which were not present on the CT scan of 05/13/2017, this may represent dystrophic calcification. Presumably the paraspinal and muscular activity may represent acute muscle injury and healing response, although clearly entity such as infection or even tumor cannot be readily excluded given the high metabolic activity. There is some central lower activity in the psoas muscles along the known collections. There is some vague metabolic activity in the sacrum, likely associated with the patient's suspected subtle sacral fracture, maximum SUV is approximately 3.6. Presacral stranding noted. IMPRESSION: 1. 3.9 cm hypermetabolic right upper lobe centrally necrotic mass abutting the mediastinum, with hypermetabolic adjacent satellite nodules in the right upper lobe. Mildly hypermetabolic right paratracheal lymph node with maximum SUV 3.1. 2. Separate bandlike focus of abnormal metabolic activity medially in the  left lower lobe is suspicious for potential metastatic disease. However, the entire left lower lobe is atelectatic and there is an adjacent small left pleural effusion, and the activity medially in the left lower lobe is fairly low-grade. 3. Hypermetabolic activity posteriorly along the L1 compression fracture in the vicinity of concern on prior MRI, suspicious for osseous metastatic spread. That said, there is also prominent metabolic activity in the paraspinal tissues and tracking along the psoas collections, and the distribution would be unusual for tumor spreading in the psoas muscles. There is some new calcifications along the psoas muscle cavities. The appearance could reflect hematoma with some organizing dystrophic calcifications and associated psoas muscle injuries contributing to the hypermetabolic activity, or conceivably psoas infection. Accentuated osseous metabolic activity associated with several fractures including the right distal clavicle, right anterior third rib, and sacrum. 4. There are several foci of accentuated bony activity of uncertain significance. Specifically, on the left at C4-5 there is high metabolic activity but there was not a visible fracture or obvious lesion on CT scan of this region dated 05/13/2017. Similarly, there is accentuated metabolic activity centrally in the T12 and L2 vertebral bodies without appreciable corresponding lesion on the MRI from 05/27/2017. The cause of these other regions of accentuated metabolic activity is uncertain given the lack of corroborating findings on the other imaging studies. 5. Multifocal activity in the esophagus, probably physiologic in this setting, less likely to be from esophageal neoplasm. 6. Other imaging findings of potential clinical significance: Right mastoid effusion. Acute sphenoid sinusitis. Chronic left maxillary sinusitis. Aortic Atherosclerosis (ICD10-I70.0). Coronary atherosclerosis. Electronically Signed   By: Van Clines M.D.   On: 05/29/2017 13:43    EKG:   Orders placed or performed during the hospital encounter of 05/22/17  . ED EKG  . ED EKG  . EKG 12-Lead  . EKG 12-Lead  . EKG    ASSESSMENT AND PLAN:  77 year old female with past medical history of rheumatoid arthritis, recent L1 fracture, right clavicle fracture managed nonoperatively and also has asthma, anxiety, restless leg syndrome comes from group home because of generalized weakness, poor p.o. intake and found to have severe hypokalemia, UTI. 1. MRSA bacteremia: Patient is on vancomycin, repeat blood cultures have been negative for 48 hrs. from February 17.  Patient did get a PICC line for 2 weeks of antibiotics  With  vancomycin.   echocardiogram shows EF around 55-60% without any vegetations.  Son did not want her to have TEE I discussed with her son regarding continuation of antibiotics or not he will talk to his mom and decide further  2.  Severe hypokalemia secondary to poor p.o. intake.  Potassium replaced  3.  Metastatic lung cancer I discussed with the son and patient they do not want to pursue any intervention aggressive therapy pain control is a goal  4.  Hyperlipidemia: Continue atorvastatin.  5.  Asthma: No wheezing.  But does have cough.  Continue bronchodilators, Tussionex.,  Further workup for possible bronchogenic carcinoma/abcess including pulmonary consult, oncology consult  6.  History of rheumatoid arthritis: Continue Plaquenil.,  Patient is on rest home because of her severe rheumatoid arthritis.  7.  Restless leg syndrome: Continue Requip.  8.Essential hypertension and now has tachycardia continue Toprol-XL.  9.  pressure ulcer on the right gluteal region: C wound care consulted, recommended daily dressing changes a with Santyl.  10.  Klebsiella UTI      All the records are reviewed and case discussed with Care Management/Social  Workerr. Management plans discussed with the patient, family and they  are in agreement.  CODE STATUS: partial  TOTAL TIME TAKING CARE OF THIS PATIENT: 35 minutes.   Prognosis poor, high risk for cardiac arrest.  Dustin Flock M.D on 05/30/2017 at 2:59 PM  Between 7am to 6pm - Pager - [phone]  After 6pm go to [email or web address] - password [email or web address] [facility]  Office  [phone]  CC: Primary care physician; Remi Haggard, FNP   Note: This dictation was prepared with Dragon dictation along with smaller phrase technology. Any transcriptional errors that result from this process are unintentional.

## 2017-05-30 NOTE — Progress Notes (Signed)
Linda Walsh   DOB:[birth year]   NO#:[id number]    Subjective: Patient was seen at bedside. She reports feeling same as yesterday. complains of back pain,not eat much, feels weak.  She wants to me to call her son Audry Pili to talk about her results.  Objective:  Vitals:   05/30/17 0743 05/30/17 1544  BP: 139/79 105/65  Pulse: (!) 122 (!) 117  Resp: 19 16  Temp: 99 F (37.2 C) 98.2 F (36.8 C)  SpO2: 94% 91%     Intake/Output Summary (Last 24 hours) at 05/30/2017 1630 Last data filed at 05/30/2017 0241 Gross per 24 hour  Intake 400 ml  Output 600 ml  Net -200 ml    GENERAL Alert, no distress and comfortable. O2 cachectic appearing. EYES: no pallor or icterus OROPHARYNX: no thrush or ulceration. NECK: supple, no masses felt LYMPH:  no palpable lymphadenopathy in the cervical, axillary or inguinal regions LUNGS: decreased breath sounds to auscultation at bases and  No wheeze or crackles HEART/CVS: regular rate & rhythm and no murmurs; No lower extremity edema ABDOMEN: abdomen soft, tender  on deep palpation. and normal bowel sounds Musculoskeletal:no cyanosis of digits and no clubbing;  hands and toes deformities secondary to rheumatoid arthritis. PSYCH: awake and alert.  NEURO: cranial nerves intact. No focal deficit.  SKIN:  no rashes or significant lesions   Labs:  Lab Results  Component Value Date   WBC 6.8 05/27/2017   HGB 10.6 (L) 05/27/2017   HCT 31.0 (L) 05/27/2017   MCV 90.9 05/27/2017   PLT 424 05/27/2017   NEUTROABS 7.8 (H) 05/13/2017    Lab Results  Component Value Date   NA 130 (L) 05/30/2017   K 3.5 05/30/2017   CL 93 (L) 05/30/2017   CO2 24 05/30/2017    Studies:  Nm Pet Image Initial (pi) Skull Base To Thigh  Result Date: 05/29/2017 CLINICAL DATA:  Initial treatment strategy for right upper lobe mass with satellite nodules. Possible pathologic fracture at L1. EXAM: NUCLEAR MEDICINE PET SKULL BASE TO THIGH TECHNIQUE: 12.3 mCi F-18 FDG was injected  intravenously. Full-ring PET imaging was performed from the skull base to thigh after the radiotracer. CT data was obtained and used for attenuation correction and anatomic localization. FASTING BLOOD GLUCOSE:  Value: 79 mg/dl COMPARISON:  Multiple exams, including 05/23/2017 CT scan and 05/27/2017 MRI FINDINGS: NECK Accentuated activity along the cerebellar vermis on the top most images is likely artifactual. There is no lesion in this vicinity on prior head CT dated 05/13/2017. No hypermetabolic lymph nodes in the neck. Right mastoid effusion with fluid filling all visible air cells. Acute sphenoid sinusitis. Chronic left maxillary sinusitis with frothy material in the sinus. Bilateral carotid atherosclerotic calcification. CHEST 3.9 by 2.9 cm right upper lobe mass abutting the mediastinal margin is hypermetabolic, especially along its periphery, maximum SUV 10.7. The adjacent satellite nodules are also hypermetabolic with maximum SUV 6.9. There is atelectasis of the left lower lobe with an adjacent pleural effusion. Small amount of accentuated metabolic activity inferomedially along the left lower lobe with maximum SUV 4.7, uncertain significance. There are multifocal accentuated regions of activity in the esophagus. In the upper esophagus, maximum SUV is 6.6. Focal mid esophageal activity maximum SUV 4.5. Distal esophageal activity maximum SUV 4.9. There can be some overlap of physiological and malignant esophageal activity in this range. Right lower paratracheal 1.0 cm in short axis lymph node with maximum SUV 3.1. Background mediastinal blood pool activity 2.5. Coronary, aortic arch, and branch  vessel atherosclerotic vascular disease. ABDOMEN/PELVIS No abnormal hypermetabolic activity within the liver, pancreas, adrenal glands, or spleen. No hypermetabolic lymph nodes in the abdomen or pelvis. Aortoiliac atherosclerotic vascular disease. Psoas muscle abnormalities discussed under the skeletal section below.  Low-grade presacral edema. Mild prominence of stool in the rectal vault. SKELETON Eccentric to the left at the C4-5 intervertebral level there is accentuated metabolic activity with maximum SUV 6.3. Looking back at the cervical spine CT from 05/13/2017 even in retrospect I do not see evidence of a metastatic lesion or other significant findings aside from spondylosis. Cause of this accentuated activity is uncertain. There is accentuated activity associated with an acute fracture the right distal clavicle, maximum SUV 6.4. This is most likely a benign fracture. Although there is some lucency in the bone this is probably from early hyperemic healing response. Probable fracture of the right third rib anteriorly with some mild irregularity of the costochondral junction, and accentuated metabolic activity with maximum SUV 3.4. There is abnormal accentuated metabolic activity posteriorly in the L1 vertebral body with maximum SUV 8.0. In addition, there is some focal accentuated activity centrally in the T12 vertebral body with maximum SUV 8.1, this did not have a correlate of finding on the MRI aside perhaps from some superior endplate chronic compression at T12. There is some faint focal accentuated metabolic activity centrally in the L2 vertebral body, maximum SUV 5.6. At the L1 level there is notable paraspinal accentuated metabolic activity tracking down along the psoas muscles. There faint dependent calcifications in the psoas muscles which were not present on the CT scan of 05/13/2017, this may represent dystrophic calcification. Presumably the paraspinal and muscular activity may represent acute muscle injury and healing response, although clearly entity such as infection or even tumor cannot be readily excluded given the high metabolic activity. There is some central lower activity in the psoas muscles along the known collections. There is some vague metabolic activity in the sacrum, likely associated with the  patient's suspected subtle sacral fracture, maximum SUV is approximately 3.6. Presacral stranding noted. IMPRESSION: 1. 3.9 cm hypermetabolic right upper lobe centrally necrotic mass abutting the mediastinum, with hypermetabolic adjacent satellite nodules in the right upper lobe. Mildly hypermetabolic right paratracheal lymph node with maximum SUV 3.1. 2. Separate bandlike focus of abnormal metabolic activity medially in the left lower lobe is suspicious for potential metastatic disease. However, the entire left lower lobe is atelectatic and there is an adjacent small left pleural effusion, and the activity medially in the left lower lobe is fairly low-grade. 3. Hypermetabolic activity posteriorly along the L1 compression fracture in the vicinity of concern on prior MRI, suspicious for osseous metastatic spread. That said, there is also prominent metabolic activity in the paraspinal tissues and tracking along the psoas collections, and the distribution would be unusual for tumor spreading in the psoas muscles. There is some new calcifications along the psoas muscle cavities. The appearance could reflect hematoma with some organizing dystrophic calcifications and associated psoas muscle injuries contributing to the hypermetabolic activity, or conceivably psoas infection. Accentuated osseous metabolic activity associated with several fractures including the right distal clavicle, right anterior third rib, and sacrum. 4. There are several foci of accentuated bony activity of uncertain significance. Specifically, on the left at C4-5 there is high metabolic activity but there was not a visible fracture or obvious lesion on CT scan of this region dated 05/13/2017. Similarly, there is accentuated metabolic activity centrally in the T12 and L2 vertebral bodies without appreciable corresponding  lesion on the MRI from 05/27/2017. The cause of these other regions of accentuated metabolic activity is uncertain given the lack of  corroborating findings on the other imaging studies. 5. Multifocal activity in the esophagus, probably physiologic in this setting, less likely to be from esophageal neoplasm. 6. Other imaging findings of potential clinical significance: Right mastoid effusion. Acute sphenoid sinusitis. Chronic left maxillary sinusitis. Aortic Atherosclerosis (ICD10-I70.0). Coronary atherosclerosis. Electronically Signed   By: Van Clines M.D.   On: 05/29/2017 13:43    Assessment & Plan:   #77 year old female patient with long-standing rheumatoid arthritis is currently admitted the hospital for MRSA bacteremia/noted to have a lung mass.  # Lung mass right upper lobe-highly concerning for malignancy/also L1 pathologic fracture associated mass.  PET scan shows primary lung mass; mediastinal adenopathy; L1 lesion.  Highly suggestive of metastatic lung cancer. Recommend biopsy to obtain tissue diagnosis. PET scan results were discussed with patient and also communicated with son Audry Pili. Son prefers no invasive procedure including biopsy until her mother's condition improves.   # L1 pathologic fracture-again highly suspicious of malignancy; patient might benefit from radiation/kyphoplasty-biopsy.  See discussion below  # MRSA bacteremia/vancomycin-per  ID management.   # Overall prognosis is poor. Palliative care following. Recommend hospice/palliative care if no aggressive measures/intervention.   Earlie Server, MD, PhD Hematology Oncology [facility] Pager- [phone] 05/30/2017

## 2017-05-30 NOTE — Progress Notes (Signed)
ELECTROLYTE CONSULT NOTE - INITIAL   Pharmacy Consult for electrolyte monitoring/replacement Indication: hypokalemia  No Known Allergies  Patient Measurements: Height: 4\' 8"  (142.2 cm) Weight: 105 lb (47.6 kg) IBW/kg (Calculated) : 36.3  Vital Signs: Temp: 99 F (37.2 C) (02/23 0743) Temp Source: Oral (02/23 0743) BP: 139/79 (02/23 0743) Pulse Rate: 122 (02/23 0743) Intake/Output from previous day: 02/22 0701 - 02/23 0700 In: 400 [IV Piggyback:400] Out: 600 [Urine:600] Intake/Output from this shift: No intake/output data recorded.  Labs: Recent Labs    05/28/17 0303 05/29/17 0316 05/30/17 0408  CREATININE 0.37* 0.47 0.96   Estimated Creatinine Clearance: 32.1 mL/min (by C-G formula based on SCr of 0.96 mg/dL).  Assessment: Pharmacy consulted to assist with electrolyte monitoring/replacement in this 77 year old female admitted with UTI, MRSA bacteremia, and FTT.  Goal of Therapy: Electrolytes WNL  Plan: Electrolytes have been stable. Pharmacy to sign off  Ramond Dial, Pharm.D, BCPS Clinical Pharmacist  05/30/2017 8:17 AM

## 2017-05-31 LAB — CREATININE, SERUM
Creatinine, Ser: 1.04 mg/dL — ABNORMAL HIGH (ref 0.44–1.00)
GFR calc Af Amer: 59 mL/min — ABNORMAL LOW (ref >60)
GFR calc non Af Amer: 51 mL/min — ABNORMAL LOW (ref >60)

## 2017-05-31 LAB — VANCOMYCIN, TROUGH: VANCOMYCIN TR: 42 ug/mL — AB (ref 15–20)

## 2017-05-31 MED ORDER — METHYLPREDNISOLONE 4 MG PO TBPK
8.0000 mg | ORAL_TABLET | Freq: Every evening | ORAL | Status: AC
  Administered 2017-05-31: 8 mg via ORAL

## 2017-05-31 MED ORDER — METHYLPREDNISOLONE 4 MG PO TBPK
4.0000 mg | ORAL_TABLET | Freq: Three times a day (TID) | ORAL | Status: AC
  Administered 2017-06-01 (×3): 4 mg via ORAL

## 2017-05-31 MED ORDER — METHYLPREDNISOLONE 4 MG PO TBPK
8.0000 mg | ORAL_TABLET | Freq: Every evening | ORAL | Status: AC
  Administered 2017-06-01: 8 mg via ORAL

## 2017-05-31 MED ORDER — METOPROLOL TARTRATE 25 MG PO TABS
25.0000 mg | ORAL_TABLET | Freq: Two times a day (BID) | ORAL | Status: DC
  Administered 2017-05-31 – 2017-06-01 (×2): 25 mg via ORAL
  Filled 2017-05-31 (×2): qty 1

## 2017-05-31 MED ORDER — METHYLPREDNISOLONE 4 MG PO TBPK
4.0000 mg | ORAL_TABLET | ORAL | Status: AC
  Administered 2017-05-31: 4 mg via ORAL

## 2017-05-31 MED ORDER — METHYLPREDNISOLONE 4 MG PO TBPK
4.0000 mg | ORAL_TABLET | Freq: Four times a day (QID) | ORAL | Status: DC
  Administered 2017-06-02: 4 mg via ORAL

## 2017-05-31 MED ORDER — METHYLPREDNISOLONE 4 MG PO TBPK
8.0000 mg | ORAL_TABLET | Freq: Every morning | ORAL | Status: AC
  Administered 2017-05-31: 8 mg via ORAL
  Filled 2017-05-31: qty 21

## 2017-05-31 NOTE — Progress Notes (Addendum)
[facility] [place] NAME: Linda Walsh    MR#:  [id number]  DATE OF BIRTH:  04/10/1940  CHIEF COMPLAINT:     Chief Complaint  Patient presents with  . Failure To Thrive   Continues to have pain in her back but more awake  REVIEW OF SYSTEMS:   ROS CONSTITUTIONAL: Cough, complains of back pain EYES: No blurred or double vision.  EARS, NOSE, AND THROAT: No tinnitus or ear pain.  RESPIRATORY: Cough,  No shortness of breath, wheezing or hemoptysis.  CARDIOVASCULAR: No chest pain, orthopnea, edema.  GASTROINTESTINAL: No nausea, vomiting, diarrhea or abdominal pain.  Poor p.o. intake GENITOURINARY: No dysuria, hematuria.  ENDOCRINE: No polyuria, nocturia,  HEMATOLOGY: No anemia, easy bruising or bleeding SKIN: Small decubiti on the right buttock MUSCULOSKELETAL: Diffuse joint pains NEUROLOGIC: No tingling, numbness, weakness.  PSYCHIATRY: No anxiety or depression.   DRUG ALLERGIES:  No Known Allergies  VITALS:  Blood pressure (!) 146/87, pulse (!) 102, temperature 97.7 F (36.5 C), temperature source Oral, resp. rate 16, height 4\' 8"  (1.422 m), weight 105 lb (47.6 kg), SpO2 97 %.  PHYSICAL EXAMINATION:  GENERAL:  77 y.o.-year-old patient lying in the bed with no acute distress.  Very  Frail individual EYES: Pupils equal, round, reactive to light.No scleral icterus. Extraocular muscles intact.  HEENT: Head atraumatic, normocephalic. Oropharynx and nasopharynx clear.  NECK:  Supple, no jugular venous distention. No thyroid enlargement, no tenderness.  LUNGS: Normal breath sounds bilaterally, no wheezing, rales,rhonchi or crepitation. No use of accessory muscles of respiration.  CARDIOVASCULAR: S1, S2 tachycardic.Marland Kitchen No murmurs, rubs, or gallops.  ABDOMEN: Soft, nontender, nondistended. Bowel sounds present. No organomegaly or mass.  EXTREMITIES: No pedal edema, cyanosis, or clubbing.  NEUROLOGIC: Cranial nerves II through XII  are intact. Muscle strength 5/5 in all extremities. Sensation intact. Gait not checked.  PSYCHIATRIC: The patient is alert and oriented x 3.  SKIN: Sacral decubiti on the right buttock with some eschar in the center, slight pus around the periphery but patient has no tenderness the size around 3-2 cm.  Consult wound care for staging, dressing recommendations.   LABORATORY PANEL:   CBC Recent Labs  Lab 05/27/17 0306  WBC 6.8  HGB 10.6*  HCT 31.0*  PLT 424   ------------------------------------------------------------------------------------------------------------------  Chemistries  Recent Labs  Lab 05/27/17 0306  05/30/17 0408 05/31/17 1115  NA 132*   < > 130*  --   K 3.9   < > 3.5  --   CL 97*   < > 93*  --   CO2 27   < > 24  --   GLUCOSE 96   < > 107*  --   BUN 8   < > 16  --   CREATININE 0.39*   < > 0.96 1.04*  CALCIUM 7.7*   < > 8.0*  --   MG 1.9  --   --   --    < > = values in this interval not displayed.   ------------------------------------------------------------------------------------------------------------------  Cardiac Enzymes No results for input(s): TROPONINI in the last 168 hours. ------------------------------------------------------------------------------------------------------------------  RADIOLOGY:  No results found.  EKG:   Orders placed or performed during the hospital encounter of 05/22/17  . ED EKG  . ED EKG  . EKG 12-Lead  . EKG 12-Lead  . EKG    ASSESSMENT AND PLAN:  77 year old female with past medical history of rheumatoid arthritis, recent L1 fracture, right clavicle fracture managed  nonoperatively and also has asthma, anxiety, restless leg syndrome comes from group home because of generalized weakness, poor p.o. intake and found to have severe hypokalemia, UTI. 1. MRSA bacteremia: Patient is on vancomycin, repeat blood cultures have been negative for 48 hrs. from February 17.  Patient did get a PICC line for 2 weeks of  antibiotics  With  vancomycin.   echocardiogram shows EF around 55-60% without any vegetations.  Son did not want her to have TEE I discussed with her son regarding continuation of antibiotics or not he will talk to his mom and decide further I will follow back up with him tomorrow to see what they have decided  2.  Severe hypokalemia secondary to poor p.o. intake.  Potassium replaced  3.  Metastatic lung cancer I discussed with the son and patient they do not want to pursue any intervention aggressive therapy pain control is a goal Due to persistent pain I will add Medrol Dosepak to see if that helps with the pain 4.  Hyperlipidemia: Continue atorvastatin.  5.  Asthma: No wheezing.  But does have cough.  Continue bronchodilators, Tussionex.,  Further workup for possible bronchogenic carcinoma/abcess including pulmonary consult, oncology consult  6.  History of rheumatoid arthritis: Continue Plaquenil.,  Patient is on rest home because of her severe rheumatoid arthritis.  7.  Restless leg syndrome: Continue Requip.  8.Essential hypertension and now has tachycardia continue Toprol-XL.  9.  pressure ulcer on the right gluteal region: C wound care consulted, recommended daily dressing changes a with Santyl.  10.  Klebsiella UTI      All the records are reviewed and case discussed with Care Management/Social Workerr. Management plans discussed with the patient, family and they are in agreement.  CODE STATUS: partial  TOTAL TIME TAKING CARE OF THIS PATIENT: 32 minutes.   Prognosis poor, high risk for cardiac arrest.  Dustin Flock M.D on 05/31/2017 at 3:39 PM  Between 7am to 6pm - Pager - [phone]  After 6pm go to [email or web address] - password [email or web address] [facility]  Office  [phone]  CC: Primary care physician; Remi Haggard, FNP   Note: This dictation was prepared with Dragon dictation along with smaller phrase technology. Any transcriptional errors  that result from this process are unintentional.

## 2017-05-31 NOTE — Progress Notes (Signed)
Pharmacy Antibiotic Note  Linda Walsh is a 77 y.o. female admitted on 05/22/2017 with MRSA bacteremia.  Pharmacy has been consulted for vancomycin dosing.  Currently ordered Vancomycin 1000mg  IV q12h. 2/22 10:00 Vancomycin trough resulted at 13 mcg/ml (dose was increased to 1000mg  q 12hr) 2/24 1115 Vanc trough =42  Goal trough is 15-20 mcg/ml  Plan: Will hold vancomycin until level drops to goal. Will recheck level in 12 hr. Can then recalculate kenetics using 2 levels  Height: 4\' 8"  (142.2 cm) Weight: 105 lb (47.6 kg) IBW/kg (Calculated) : 36.3  Temp (24hrs), Avg:98.4 F (36.9 C), Min:98.2 F (36.8 C), Max:98.6 F (37 C)  Recent Labs  Lab 05/27/17 0306  05/28/17 0303 05/29/17 0316 05/29/17 0951 05/30/17 0408 05/31/17 1115  WBC 6.8  --   --   --   --   --   --   CREATININE 0.39*  --  0.37* 0.47  --  0.96 1.04*  VANCOTROUGH  --    < >  --   --  13*  --  42*   < > = values in this interval not displayed.    Estimated Creatinine Clearance: 29.6 mL/min (A) (by C-G formula based on SCr of 1.04 mg/dL (H)).    No Known Allergies  Antimicrobials this admission: vancomycin 2/17 >>  ceftriaxone 2/15 >> 2/16  Dose adjustments this admission: 2/20 increased to 750mg  IV q12h 2/22 increased to 1g IV q12h  Microbiology results: 2/15 BCx: 4/4 MRSA  2/15 UCx: Sent   Thank you for allowing pharmacy to be a part of this patient's care.  Ramond Dial, Pharm.D, BCPS Clinical Pharmacist  05/31/2017 12:08 PM

## 2017-06-01 DIAGNOSIS — K5903 Drug induced constipation: Secondary | ICD-10-CM

## 2017-06-01 DIAGNOSIS — Z515 Encounter for palliative care: Secondary | ICD-10-CM

## 2017-06-01 DIAGNOSIS — A419 Sepsis, unspecified organism: Secondary | ICD-10-CM

## 2017-06-01 DIAGNOSIS — Z7189 Other specified counseling: Secondary | ICD-10-CM

## 2017-06-01 LAB — VANCOMYCIN, RANDOM: VANCOMYCIN RM: 26

## 2017-06-01 MED ORDER — GLYCOPYRROLATE 1 MG PO TABS
1.0000 mg | ORAL_TABLET | ORAL | Status: DC | PRN
  Filled 2017-06-01: qty 1

## 2017-06-01 MED ORDER — BISACODYL 10 MG RE SUPP
10.0000 mg | Freq: Every day | RECTAL | Status: DC | PRN
  Administered 2017-06-01: 10 mg via RECTAL
  Filled 2017-06-01: qty 1

## 2017-06-01 MED ORDER — GLYCOPYRROLATE 0.2 MG/ML IJ SOLN
0.2000 mg | INTRAMUSCULAR | Status: DC | PRN
  Filled 2017-06-01: qty 1

## 2017-06-01 MED ORDER — VANCOMYCIN HCL IN DEXTROSE 750-5 MG/150ML-% IV SOLN
750.0000 mg | INTRAVENOUS | Status: DC
  Filled 2017-06-01: qty 150

## 2017-06-01 MED ORDER — LORAZEPAM 1 MG PO TABS: 1.0000 mg | ORAL_TABLET | ORAL | 0 refills | Status: AC | PRN

## 2017-06-01 MED ORDER — SENNOSIDES-DOCUSATE SODIUM 8.6-50 MG PO TABS
2.0000 | ORAL_TABLET | Freq: Two times a day (BID) | ORAL | Status: DC
  Administered 2017-06-01 – 2017-06-02 (×2): 2 via ORAL
  Filled 2017-06-01 (×2): qty 2

## 2017-06-01 MED ORDER — POLYETHYLENE GLYCOL 3350 17 G PO PACK
17.0000 g | PACK | Freq: Every day | ORAL | Status: DC
  Administered 2017-06-01 – 2017-06-02 (×2): 17 g via ORAL
  Filled 2017-06-01 (×2): qty 1

## 2017-06-01 MED ORDER — ACETAMINOPHEN 650 MG RE SUPP: 650.0000 mg | Freq: Four times a day (QID) | RECTAL | Status: DC | PRN

## 2017-06-01 MED ORDER — ACETAMINOPHEN 325 MG PO TABS: 650.0000 mg | ORAL_TABLET | Freq: Four times a day (QID) | ORAL | Status: DC | PRN

## 2017-06-01 MED ORDER — SODIUM CHLORIDE 0.9 % IV SOLN: 250.0000 mL | INTRAVENOUS | Status: DC | PRN

## 2017-06-01 MED ORDER — ONDANSETRON 4 MG PO TBDP: 4.0000 mg | ORAL_TABLET | Freq: Four times a day (QID) | ORAL | 0 refills | Status: AC | PRN

## 2017-06-01 MED ORDER — HALOPERIDOL LACTATE 5 MG/ML IJ SOLN: 0.5000 mg | INTRAMUSCULAR | Status: DC | PRN

## 2017-06-01 MED ORDER — FENTANYL 25 MCG/HR TD PT72
25.0000 ug | MEDICATED_PATCH | TRANSDERMAL | Status: DC
  Administered 2017-06-01: 25 ug via TRANSDERMAL
  Filled 2017-06-01: qty 1

## 2017-06-01 MED ORDER — SODIUM CHLORIDE 0.9% FLUSH: 3.0000 mL | INTRAVENOUS | Status: DC | PRN

## 2017-06-01 MED ORDER — MORPHINE SULFATE (CONCENTRATE) 20 MG/ML PO SOLN
10.0000 mg | ORAL | 0 refills | Status: AC | PRN
Start: 2017-06-01 — End: ?

## 2017-06-01 MED ORDER — LORAZEPAM 1 MG PO TABS: 1.0000 mg | ORAL_TABLET | ORAL | Status: DC | PRN

## 2017-06-01 MED ORDER — FLEET ENEMA 7-19 GM/118ML RE ENEM
1.0000 | ENEMA | Freq: Once | RECTAL | Status: AC
  Administered 2017-06-01: 1 via RECTAL

## 2017-06-01 MED ORDER — HALOPERIDOL 0.5 MG PO TABS
0.5000 mg | ORAL_TABLET | ORAL | Status: DC | PRN
  Filled 2017-06-01: qty 1

## 2017-06-01 MED ORDER — POLYVINYL ALCOHOL 1.4 % OP SOLN
1.0000 [drp] | Freq: Four times a day (QID) | OPHTHALMIC | Status: DC | PRN
  Filled 2017-06-01: qty 15

## 2017-06-01 MED ORDER — FENTANYL 25 MCG/HR TD PT72
25.0000 ug | MEDICATED_PATCH | TRANSDERMAL | 0 refills | Status: AC
Start: 2017-06-04 — End: ?

## 2017-06-01 MED ORDER — ONDANSETRON 4 MG PO TBDP
4.0000 mg | ORAL_TABLET | Freq: Four times a day (QID) | ORAL | Status: DC | PRN
  Administered 2017-06-02: 4 mg via ORAL
  Filled 2017-06-01 (×2): qty 1

## 2017-06-01 MED ORDER — HALOPERIDOL LACTATE 2 MG/ML PO CONC
0.5000 mg | ORAL | Status: DC | PRN
  Filled 2017-06-01: qty 0.3

## 2017-06-01 MED ORDER — ONDANSETRON HCL 4 MG/2ML IJ SOLN
4.0000 mg | Freq: Four times a day (QID) | INTRAMUSCULAR | Status: DC | PRN
  Administered 2017-06-01: 4 mg via INTRAVENOUS
  Filled 2017-06-01: qty 2

## 2017-06-01 MED ORDER — BIOTENE DRY MOUTH MT LIQD: 15.0000 mL | OROMUCOSAL | Status: DC | PRN

## 2017-06-01 MED ORDER — SODIUM CHLORIDE 0.9% FLUSH
3.0000 mL | Freq: Two times a day (BID) | INTRAVENOUS | Status: DC
  Administered 2017-06-01 – 2017-06-02 (×2): 3 mL via INTRAVENOUS

## 2017-06-01 NOTE — Progress Notes (Signed)
PT Cancellation Note  Patient Details Name: Linda Walsh MRN: [id number] DOB: [birth year]   Cancelled Treatment:    Reason Eval/Treat Not Completed: Medical issues which prohibited therapy(Per chart review, patient/family transitioning care plan to comfort care only.  Will complete PT order at this time. Please re-consult should goals of care/needs change.)   Annsley Akkerman H. Owens Shark, PT, DPT, NCS 06/01/17, 1:08 PM [phone]

## 2017-06-01 NOTE — Progress Notes (Signed)
Pharmacy Antibiotic Note  Linda Walsh is a 77 y.o. female admitted on 05/22/2017 with MRSA bacteremia.  Pharmacy has been consulted for vancomycin dosing.  Currently ordered Vancomycin 1000mg  IV q12h. 2/22 10:00 Vancomycin trough resulted at 13 mcg/ml (dose was increased to 1000mg  q 12hr) 2/24 1115 Vanc trough =42  Goal trough is 15-20 mcg/ml  Plan: Will hold vancomycin until level drops to goal. Will recheck level in 12 hr. Can then recalculate kenetics using 2 levels  2/25 0500 vanc random 26. kei based on serial levels ~0.026 hr-1  T1/2 28 hours. Will resume 750 mg q 36 hours today at 1800. Level before second new dose to evaluate regimen.  Height: 4\' 8"  (142.2 cm) Weight: 105 lb (47.6 kg) IBW/kg (Calculated) : 36.3  Temp (24hrs), Avg:98.1 F (36.7 C), Min:97.7 F (36.5 C), Max:98.4 F (36.9 C)  Recent Labs  Lab 05/27/17 0306  05/28/17 0303 05/29/17 0316 05/29/17 0951 05/30/17 0408 05/31/17 1115 06/01/17 0457  WBC 6.8  --   --   --   --   --   --   --   CREATININE 0.39*  --  0.37* 0.47  --  0.96 1.04*  --   VANCOTROUGH  --    < >  --   --  13*  --  42*  --   VANCORANDOM  --   --   --   --   --   --   --  26   < > = values in this interval not displayed.    Estimated Creatinine Clearance: 29.6 mL/min (A) (by C-G formula based on SCr of 1.04 mg/dL (H)).    No Known Allergies  Antimicrobials this admission: vancomycin 2/17 >>  ceftriaxone 2/15 >> 2/16  Dose adjustments this admission: 2/20 increased to 750mg  IV q12h 2/22 increased to 1g IV q12h  Microbiology results: 2/15 BCx: 4/4 MRSA  2/15 UCx: Sent   Thank you for allowing pharmacy to be a part of this patient's care.  Ramond Dial, Pharm.D, BCPS Clinical Pharmacist  06/01/2017 5:40 AM

## 2017-06-01 NOTE — Progress Notes (Deleted)
No charge note:   Palliative consult received.   Chart reviewed.   Spoke with patient's son and daughter in law via phone. Introduced palliative medicine.   Patient's spouse is reluctant to advance care planning. Patient has been running household and making decisions, even in her state of dementia and schizophrenia.   Son and daughter in law feel family is very much in need of "big picture" understanding view of patient's state of health.   [place] meeting scheduled for 2/26 at 0930.  Mariana Kaufman, AGNP-C Palliative Medicine  Please call Palliative Medicine team phone with any questions [phone]. For individual providers please see AMION.

## 2017-06-01 NOTE — Progress Notes (Signed)
[facility] at [place] NAME: Linda Walsh    MR#:  [id number]  DATE OF BIRTH:  Apr 04, 1941  CHIEF COMPLAINT:     Chief Complaint  Patient presents with  . Failure To Thrive  Patient continues to have pain.  I have discussed with the patient and son We will stop all treatment just comfort care measures  REVIEW OF SYSTEMS:   ROS CONSTITUTIONAL: Cough, complains of back pain EYES: No blurred or double vision.  EARS, NOSE, AND THROAT: No tinnitus or ear pain.  RESPIRATORY: Cough,  No shortness of breath, wheezing or hemoptysis.  CARDIOVASCULAR: No chest pain, orthopnea, edema.  GASTROINTESTINAL: No nausea, vomiting, diarrhea or abdominal pain.  Poor p.o. intake GENITOURINARY: No dysuria, hematuria.  ENDOCRINE: No polyuria, nocturia,  HEMATOLOGY: No anemia, easy bruising or bleeding SKIN: Small decubiti on the right buttock MUSCULOSKELETAL: Diffuse pain especially in the back  nEUROLOGIC: No tingling, numbness, weakness.  PSYCHIATRY: No anxiety or depression.   DRUG ALLERGIES:  No Known Allergies  VITALS:  Blood pressure 127/64, pulse 96, temperature 98.4 F (36.9 C), temperature source Oral, resp. rate 20, height 4\' 8"  (1.422 m), weight 105 lb (47.6 kg), SpO2 93 %.  PHYSICAL EXAMINATION:  GENERAL:  77 y.o.-year-old patient lying in the bed with no acute distress.  Very  Frail individual EYES: Pupils equal, round, reactive to light.No scleral icterus. Extraocular muscles intact.  HEENT: Head atraumatic, normocephalic. Oropharynx and nasopharynx clear.  NECK:  Supple, no jugular venous distention. No thyroid enlargement, no tenderness.  LUNGS: Normal breath sounds bilaterally, no wheezing, rales,rhonchi or crepitation. No use of accessory muscles of respiration.  CARDIOVASCULAR: S1, S2 tachycardic.Marland Kitchen No murmurs, rubs, or gallops.  ABDOMEN: Soft, nontender, nondistended. Bowel sounds present. No organomegaly or mass.  EXTREMITIES:  No pedal edema, cyanosis, or clubbing.  NEUROLOGIC: Cranial nerves II through XII are intact. Muscle strength 5/5 in all extremities. Sensation intact. Gait not checked.  PSYCHIATRIC: The patient is alert and oriented x 3.  SKIN: Sacral decubiti on the right buttock with some eschar in the center, slight pus around the periphery but patient has no tenderness the size around 3-2 cm.  Consult wound care for staging, dressing recommendations.   LABORATORY PANEL:   CBC Recent Labs  Lab 05/27/17 0306  WBC 6.8  HGB 10.6*  HCT 31.0*  PLT 424   ------------------------------------------------------------------------------------------------------------------  Chemistries  Recent Labs  Lab 05/27/17 0306  05/30/17 0408 05/31/17 1115  NA 132*   < > 130*  --   K 3.9   < > 3.5  --   CL 97*   < > 93*  --   CO2 27   < > 24  --   GLUCOSE 96   < > 107*  --   BUN 8   < > 16  --   CREATININE 0.39*   < > 0.96 1.04*  CALCIUM 7.7*   < > 8.0*  --   MG 1.9  --   --   --    < > = values in this interval not displayed.   ------------------------------------------------------------------------------------------------------------------  Cardiac Enzymes No results for input(s): TROPONINI in the last 168 hours. ------------------------------------------------------------------------------------------------------------------  RADIOLOGY:  No results found.  EKG:   Orders placed or performed during the hospital encounter of 05/22/17  . ED EKG  . ED EKG  . EKG 12-Lead  . EKG 12-Lead  . EKG    ASSESSMENT AND PLAN:  77 year old female  with past medical history of rheumatoid arthritis, recent L1 fracture, right clavicle fracture managed nonoperatively and also has asthma, anxiety, restless leg syndrome comes from group home because of generalized weakness, poor p.o. intake and found to have severe hypokalemia, UTI. 1. MRSA bacteremia: Patient is on vancomycin,  Discussed with the son will pursue  comfort care measures stop IV vancomycin   2.  Severe hypokalemia secondary to poor p.o. intake.  Potassium replaced  3.  Metastatic lung cancer Prognosis poor comfort care Hospice home  4.  Hyperlipidemia: Discontinue atorvastatin  5.  Asthma: No wheezing.   Use only medications for symptom relief  7.  Restless leg syndrome: Continue Requip.  8.Essential hypertension and now has tachycardia continue Toprol-XL.  9.  pressure ulcer on the right gluteal region: C wound care consulted, recommended daily dressing changes a with Santyl.  10.  Klebsiella UTI      All the records are reviewed and case discussed with Care Management/Social Workerr. Management plans discussed with the patient, family and they are in agreement.  CODE STATUS: partial  TOTAL TIME TAKING CARE OF THIS PATIENT: 32 minutes.   Prognosis poor, high risk for cardiac arrest.  Dustin Flock M.D on 06/01/2017 at 1:15 PM  Between 7am to 6pm - Pager - [phone]  After 6pm go to [email or web address] - password [email or web address] [facility]  Office  [phone]  CC: Primary care physician; Remi Haggard, FNP   Note: This dictation was prepared with Dragon dictation along with smaller phrase technology. Any transcriptional errors that result from this process are unintentional.

## 2017-06-01 NOTE — Progress Notes (Signed)
Poor Prognosis  Will sign off at this time, please call back with questions  Thank you!    Corrin Parker, M.D.  [facility]  Medical Director [place] Director [facility] Cardio-Pulmonary Department

## 2017-06-01 NOTE — Progress Notes (Addendum)
Daily Progress Note   Patient Name: Linda Walsh       Date: 06/01/2017 DOB: [birth year]  Age: 77 y.o. MRN#: [id number] Attending Physician: Dustin Flock, MD Primary Care Physician: Remi Haggard, FNP Admit Date: 05/22/2017  Reason for Consultation/Follow-up: Establishing goals of care and Pain control  Subjective: Patient in bed. Tells me she's just trying to get some rest. Breakfast tray at bedside- she does not want to eat any. Noted to use prn morphine 10mg  IV prn over the last 24 hrs (po equivalent 30mg  in last 24 hrs). With Fentanyl patch 12.90mcg/hr this totals po morphine equivalent of 55mg  in last 24 hrs.  Talked with [place]. Discussed patient not eating. Audry Pili inquired about feeding tube. Discussed GOC with feeding tubes- used for irreversible processes, not recommended for FTT or end of life process.  Very poor overall prognosis. He is leaning towards discontinuing IV antibiotics and requesting residential hospice. He is coming to hospital around 1230 and we will firm up plans.   ROS  Length of Stay: 10  Current Medications: Scheduled Meds:  . budesonide  2 mL Nebulization Daily  . collagenase   Topical Daily  . enoxaparin (LOVENOX) injection  40 mg Subcutaneous Q24H  . feeding supplement (ENSURE ENLIVE)  237 mL Oral BID BM  . fentaNYL  25 mcg Transdermal Q72H  . hydroxychloroquine  400 mg Oral Daily  . LORazepam  1 mg Oral QPM  . Melatonin  5 mg Oral QHS  . metaxalone  800 mg Oral TID  . methylPREDNISolone  4 mg Oral 3 x daily with food  . [START ON 06/02/2017] methylPREDNISolone  4 mg Oral 4X daily taper  . methylPREDNISolone  8 mg Oral Nightly  . metoprolol tartrate  25 mg Oral BID  . mirtazapine  30 mg Oral QHS  . mometasone-formoterol  2 puff Inhalation BID  .  pantoprazole  40 mg Oral Daily  . potassium chloride  20 mEq Oral Daily  . rOPINIRole  0.5 mg Oral QHS  . senna-docusate  1 tablet Oral BID  . sodium chloride flush  10-40 mL Intracatheter Q12H    Continuous Infusions: . vancomycin      PRN Meds: acetaminophen **OR** [DISCONTINUED] acetaminophen, albuterol, chlorpheniramine-HYDROcodone, morphine injection, ondansetron **OR** ondansetron (ZOFRAN) IV, sodium chloride flush, traMADol  Physical Exam  Pulmonary/Chest:  Effort normal.  dimnished  Abdominal: Soft. Bowel sounds are normal. She exhibits no distension. There is no tenderness.            Vital Signs: BP 127/64   Pulse 96   Temp 98.4 F (36.9 C) (Oral)   Resp 20   Ht 4\' 8"  (1.422 m)   Wt 47.6 kg (105 lb)   SpO2 93%   BMI 23.54 kg/m  SpO2: SpO2: 93 % O2 Device: O2 Device: Not Delivered O2 Flow Rate:    Intake/output summary:   Intake/Output Summary (Last 24 hours) at 06/01/2017 0959 Last data filed at 06/01/2017 5573 Gross per 24 hour  Intake 10 ml  Output 200 ml  Net -190 ml   LBM: Last BM Date: 05/20/17 Baseline Weight: Weight: 47.6 kg (105 lb) Most recent weight: Weight: 47.6 kg (105 lb)       Palliative Assessment/Data: PPS: 20%    Flowsheet Rows     Most Recent Value  Intake Tab  Referral Department  Hospitalist  Unit at Time of Referral  Med/Surg Unit  Palliative Care Primary Diagnosis  Cardiac  Date Notified  05/27/17  Palliative Care Type  New Palliative care  Reason for referral  Clarify Goals of Care  Date of Admission  05/22/17  Date first seen by Palliative Care  05/28/17  # of days Palliative referral response time  1 Day(s)  # of days IP prior to Palliative referral  5  Clinical Assessment  Psychosocial & Spiritual Assessment  Palliative Care Outcomes      Patient Active Problem List   Diagnosis Date Noted  . Lung mass   . Pressure injury of skin 05/23/2017  . MRSA bacteremia 05/23/2017  . UTI (urinary tract infection)  05/22/2017  . L1 vertebral fracture ([facility]) 05/13/2017    Palliative Care Assessment & Plan   Patient Profile: Patient is 77 yo female with hx of takasubo cardiomyopathy, RA, depression, CHF, admitted on 05/22/17 with increasing weakness, lethargy, poor po intake. This is second admission this month. Recently discharged for admission due to influenza, and fall resulting in clavicle and L1 compression fracture. This admission workup reveals MRSA bacteremia, UTI, new lung bronchiogenic lung mass, PET scan shows uptake in R and L lung, paratracheal lymph node, and spine indicating likely carcinoma with metastasis. Palliative medicine consulted for [place].    Assessment/Recommendations/Plan   Increase fentanyl TD to 53mcg/hr  F/u with patient's son today at 34 for further [place] discussion- anticipate residential hospice referral as patient is not eating or progressing functionally  Constipation- likely opioid induced- Miralax x1 today- continue senna 1 tab BID  Addendum: Noted referral for residential hospice- discussed with Audry Pili and patient- they are ready to proceed with comfort care and transfer to residential hospice   Goals of Care and Additional Recommendations:  Limitations on Scope of Treatment: no further diagnostics for lung malignancy  Code Status:  DNR  Prognosis:   < 2 weeks  Discharge Planning:  To Be Determined  Care plan was discussed with patient's son- Audry Pili.  Thank you for allowing the Palliative Medicine Team to assist in the care of this patient.   Time In: 0930 Time Out: 1005 Total Time 35 mins Prolonged Time Billed no      Greater than 50%  of this time was spent counseling and coordinating care related to the above assessment and plan.  Mariana Kaufman, AGNP-C Palliative Medicine   Please contact Palliative Medicine Team phone at [phone] for questions and  concerns.

## 2017-06-01 NOTE — Discharge Instructions (Signed)
[place] at [place]:  Regular diet  DISCHARGE CONDITION:  Stable  ACTIVITY:  Activity as tolerated  OXYGEN:  Home Oxygen: No.   Oxygen Delivery: room air  DISCHARGE LOCATION:  Hospices home    ADDITIONAL DISCHARGE INSTRUCTION:   If you experience worsening of your admission symptoms, develop shortness of breath, life threatening emergency, suicidal or homicidal thoughts you must seek medical attention immediately by calling 911 or calling your MD immediately  if symptoms less severe.  You Must read complete instructions/literature along with all the possible adverse reactions/side effects for all the Medicines you take and that have been prescribed to you. Take any new Medicines after you have completely understood and accpet all the possible adverse reactions/side effects.   Please note  You were cared for by a hospitalist during your hospital stay. If you have any questions about your discharge medications or the care you received while you were in the hospital after you are discharged, you can call the unit and asked to speak with the hospitalist on call if the hospitalist that took care of you is not available. Once you are discharged, your primary care physician will handle any further medical issues. Please note that NO REFILLS for any discharge medications will be authorized once you are discharged, as it is imperative that you return to your primary care physician (or establish a relationship with a primary care physician if you do not have one) for your aftercare needs so that they can reassess your need for medications and monitor your lab values.

## 2017-06-01 NOTE — Progress Notes (Addendum)
LCSW following for disposition  LCSW spoke with MD who reports family and treatment team have decided to move towards comfort care.  Reports the son Audry Pili) would like hospice home in [place].  LCSW met with son at bedside, confirmed plans and spoke with hospice liaison RN regarding referral.  Referral being reviewed. Will assist with transition and needs. There is a barrier with son getting to hospice home as he has no transportation. RN working to address with son. Packet completed in the event patient is able to move to hospice home.  No other needs at this time.  Lane Hacker, MSW Clinical Social Work: [employer] Coverage for :  [phone]

## 2017-06-01 NOTE — Discharge Summary (Deleted)
[place] at [facility], 77 y.o., DOB Feb 24, 1941, MRN [id number]. Admission date: 05/22/2017 Discharge Date 06/01/2017 Primary MD Remi Haggard, FNP Admitting Physician Henreitta Leber, MD  Admission Diagnosis  Hypokalemia [E87.6] Urinary tract infection, acute [N39.0] Sepsis, due to unspecified organism [facility]) [A41.9]  Discharge Diagnosis   Principal Problem: MRSA bacteremia Metastatic lung cancer Severe hypokalemia Asthma History of rheumatoid arthritis Restless leg syndrome Essential hypertension Pressure ulcer on the right gluteal region Klebsiella UTI   Hospital Course -year-old female with past medical history of rheumatoid arthritis, recent L1 fracture, right clavicle fracture managed nonoperatively and also has asthma, anxiety, restless leg syndrome comes from group home because of generalized weakness, poor p.o. intake and found to have severe hypokalemia, UTI.  Further evaluation revealed that patient had MRSA sepsis.  She was started on IV antibiotics.  To look for source of the sepsis she underwent a CT scan of the chest and abdomen and was noted to have a large lung mass as well as metastatic disease in the lumbar spine confirmed by a PET scan.  Further discussions were held with the patient and her son and it was decided to make her DNR.  They did not want to pursue a biopsy or any chemo or any aggressive therapy.  They have chose to go to hospice home.               Consults  pulmonary/intensive care, oncology, and infectious disease  Significant Tests:  See full reports for all details     Dg Chest 2 View  Result Date: 05/22/2017 CLINICAL DATA:  Fatigue, recent flu EXAM: CHEST  2 VIEW COMPARISON:  05/13/2017 FINDINGS: Negative for heart failure. Lungs are clear without infiltrate or effusion. Atherosclerotic aorta. Normal heart size. L1 compression fracture unchanged. IMPRESSION: No active cardiopulmonary disease.  Electronically Signed   By: Franchot Gallo M.D.   On: 05/22/2017 13:28   Dg Chest 2 View  Result Date: 05/13/2017 CLINICAL DATA:  Cough, recent fall EXAM: CHEST  2 VIEW COMPARISON:  Chest x-ray of 05/12/2017 FINDINGS: The lungs remain somewhat hyperaerated but clear. No pneumonia or effusion is seen. Mediastinal hilar contours are unremarkable and the heart is within normal limits in size. The bones are osteopenic. There are diffuse degenerative changes in the shoulders. The right distal clavicle fracture with separation of the right AC joint is not as well seen by chest x-ray. IMPRESSION: 1. No active lung disease.  Slight hyper aeration. 2. The fracture of the distal right clavicle with dislocation of the right AC joint noted on right shoulder films is not as well seen by chest x-ray. 3. Degenerative changes of the shoulders. Electronically Signed   By: Ivar Drape M.D.   On: 05/13/2017 10:39   Dg Chest 2 View  Result Date: 05/13/2017 CLINICAL DATA:  77 year old female history of cough for 1 month. EXAM: CHEST  2 VIEW COMPARISON:  Chest x-ray 04/30/2016. FINDINGS: Lung volumes are normal. No consolidative airspace disease. No pleural effusions. No pneumothorax. No pulmonary nodule or mass noted. Pulmonary vasculature and the cardiomediastinal silhouette are within normal limits. Atherosclerosis in the thoracic aorta. IMPRESSION: 1.  No radiographic evidence of acute cardiopulmonary disease. 2. Aortic atherosclerosis. Electronically Signed   By: Vinnie Langton M.D.   On: 05/13/2017 08:47   Dg Lumbar Spine Complete  Result Date: 05/13/2017 CLINICAL DATA:  Cough, fell yesterday with some low back pain EXAM: LUMBAR SPINE - COMPLETE 4+ VIEW COMPARISON:  None. FINDINGS: The lumbar vertebrae are slightly straightened in alignment. There is what appears to be and acute compression deformity of L1 vertebral body of approximately 40%. There may be slight retropulsion at the level of the compression deformity. The  bones are osteopenic. There is degenerative disc disease at L4-5 with some loss of disc space. The SI joints are corticated. IMPRESSION: 1. Probable acute compression deformity of L1 vertebral body of approximately 40% with some retropulsion. 2. Diffuse osteopenia. 3. 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 Electronically Signed   By: Ivar Drape M.D.   On: 05/13/2017 10:26   Dg Pelvis 1-2 Views  Result Date: 05/13/2017 CLINICAL DATA:  [place] yesterday now with low back pain EXAM: PELVIS - 1-2 VIEW COMPARISON:  None FINDINGS: There is mild degenerative joint disease with some loss of joint space bilaterally. No hip fracture is seen. There appears to be and old left inferior pubic ramus fracture but clinical correlation is recommended. Also there is a faint lucency through the right pubic ramus most likely overlapping, but a subtle fracture cannot be excluded. The pubic ramus is in normal position. The SI joints are corticated. IMPRESSION: 1. Probable old fracture of the left inferior pelvic ramus. Correlate clinically. 2. Vague lucency through the right pubic ramus may be due to overlapping structures, but correlate clinically or consider CT of the pelvis to assess these areas further. Electronically Signed   By: Ivar Drape M.D.   On: 05/13/2017 12:10   Dg Shoulder Right  Result Date: 05/13/2017 CLINICAL DATA:  Recent fall, back pain EXAM: RIGHT SHOULDER - 2+ VIEW COMPARISON:  Chest x-ray of 05/13/2017 and 05/12/2017 FINDINGS: Fracture separation of the right [facility] joint is noted. There is a significant degenerative joint disease of the right glenohumeral joint with loss of joint space and spurring. Cortical discontinuity of the posterior right seventh rib is noted which could represent fracture or old deformity. IMPRESSION: 1. Fracture/separation of the right [facility] joint which may be acute. Correlate clinically. 2. 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 Electronically Signed    By: Ivar Drape M.D.   On: 05/13/2017 10:29   Ct Head Wo Contrast  Result Date: 05/13/2017 CLINICAL DATA:  Fall, posterior skull injury, hematoma.  Neck pain. EXAM: CT HEAD WITHOUT CONTRAST CT CERVICAL SPINE WITHOUT CONTRAST TECHNIQUE: Multidetector CT imaging of the head and cervical spine was performed following the standard protocol without intravenous contrast. Multiplanar CT image reconstructions of the cervical spine were also generated. COMPARISON:  None. FINDINGS: CT HEAD FINDINGS Brain: No evidence of acute infarction, hemorrhage, hydrocephalus, extra-axial collection or mass lesion/mass effect. Mild cortical atrophy. Subcortical white matter and periventricular small vessel ischemic changes. Vascular: Intracranial atherosclerosis. Skull: Normal. Negative for fracture or focal lesion. Sinuses/Orbits: Partial opacification of the bilateral ethmoid and sphenoid sinuses. Opacification of the right mastoid air cells. Other: Mild soft tissue swelling/hematoma overlying the right posterior vertex. CT CERVICAL SPINE FINDINGS Alignment: Normal cervical lordosis. Skull base and vertebrae: No acute fracture. No primary bone lesion or focal pathologic process. Soft tissues and spinal canal: No prevertebral fluid or swelling. No visible canal hematoma. Disc levels:  Mild degenerative changes of the mid cervical spine. Spinal canal is patent. Upper chest: Visualized lung apices are notable for biapical pleural-parenchymal scarring. Other: Visualized thyroid is unremarkable. IMPRESSION: Mild soft tissue swelling/hematoma overlying the right posterior vertex. No evidence of calvarial fracture. No evidence of acute intracranial abnormality. Atrophy with small vessel ischemic changes. Opacification of the right mastoid  air cells. No evidence of traumatic injury to the cervical spine. Electronically Signed   By: Julian Hy M.D.   On: 05/13/2017 11:28   Ct Angio Chest Pe W Or Wo Contrast  Result Date:  05/23/2017 CLINICAL DATA:  Evaluate for pulmonary embolus. EXAM: CT ANGIOGRAPHY CHEST WITH CONTRAST TECHNIQUE: Multidetector CT imaging of the chest was performed using the standard protocol during bolus administration of intravenous contrast. Multiplanar CT image reconstructions and MIPs were obtained to evaluate the vascular anatomy. CONTRAST:  146mL ISOVUE-370 IOPAMIDOL (ISOVUE-370) INJECTION 76% COMPARISON:  None. FINDINGS: Cardiovascular: The heart size appears normal. No pericardial effusion. Aortic atherosclerosis noted. Calcification within the LAD coronary artery is noted. The main pulmonary artery appears patent. No saddle embolus or central obstructing emboli identified. No lobar or segmental pulmonary artery filling defects identified. Mediastinum/Nodes: Normal appearance of the thyroid gland. The trachea appears patent and is midline. Normal appearance of the esophagus. No enlarged mediastinal or hilar lymph nodes. Lungs/Pleura: No pleural effusion. Dependent changes and subsegmental atelectasis noted in the left base. Within the medial right upper lobe there is a necrotic appearing lung mass which extends into the posterior mediastinum posterior to the trachea and has mass effect upon the proximal esophagus. This measures 3.0 by 4.0 by 3.9 cm. Several satellite nodules are identified within the right upper lobe, the largest appears cavitary measuring 1.2 cm, image 17 of series 10. Upper Abdomen: No acute abnormality. Musculoskeletal: No suspicious bone lesions. No acute bone abnormality. Review of the MIP images confirms the above findings. IMPRESSION: 1. Negative for acute pulmonary embolus. 2. Mass within the medial right upper lobe is identified invading the posterior mediastinum which is suspicious for primary bronchogenic carcinoma. Several additional satellite nodules are noted within the right upper lobe. Further evaluation with PET-CT and tissue sampling is advised. 3. Aortic atherosclerosis  and coronary artery calcifications. Aortic Atherosclerosis (ICD10-I70.0). Electronically Signed   By: Kerby Moors M.D.   On: 05/23/2017 11:32   Ct Cervical Spine Wo Contrast  Result Date: 05/13/2017 CLINICAL DATA:  Fall, posterior skull injury, hematoma.  Neck pain. EXAM: CT HEAD WITHOUT CONTRAST CT CERVICAL SPINE WITHOUT CONTRAST TECHNIQUE: Multidetector CT imaging of the head and cervical spine was performed following the standard protocol without intravenous contrast. Multiplanar CT image reconstructions of the cervical spine were also generated. COMPARISON:  None. FINDINGS: CT HEAD FINDINGS Brain: No evidence of acute infarction, hemorrhage, hydrocephalus, extra-axial collection or mass lesion/mass effect. Mild cortical atrophy. Subcortical white matter and periventricular small vessel ischemic changes. Vascular: Intracranial atherosclerosis. Skull: Normal. Negative for fracture or focal lesion. Sinuses/Orbits: Partial opacification of the bilateral ethmoid and sphenoid sinuses. Opacification of the right mastoid air cells. Other: Mild soft tissue swelling/hematoma overlying the right posterior vertex. CT CERVICAL SPINE FINDINGS Alignment: Normal cervical lordosis. Skull base and vertebrae: No acute fracture. No primary bone lesion or focal pathologic process. Soft tissues and spinal canal: No prevertebral fluid or swelling. No visible canal hematoma. Disc levels:  Mild degenerative changes of the mid cervical spine. Spinal canal is patent. Upper chest: Visualized lung apices are notable for biapical pleural-parenchymal scarring. Other: Visualized thyroid is unremarkable. IMPRESSION: Mild soft tissue swelling/hematoma overlying the right posterior vertex. No evidence of calvarial fracture. No evidence of acute intracranial abnormality. Atrophy with small vessel ischemic changes. Opacification of the right mastoid air cells. No evidence of traumatic injury to the cervical spine. Electronically Signed   By:  Julian Hy M.D.   On: 05/13/2017 11:28   Ct Lumbar  Spine Wo Contrast  Result Date: 05/13/2017 CLINICAL DATA:  Status post fall yesterday.  Back pain. EXAM: CT LUMBAR SPINE WITHOUT CONTRAST TECHNIQUE: Multidetector CT imaging of the lumbar spine was performed without intravenous contrast administration. Multiplanar CT image reconstructions were also generated. COMPARISON:  None. FINDINGS: Segmentation: 5 lumbar type vertebrae. Alignment: Normal. Vertebrae: Generalized osteopenia. L1 compression fracture with approximately 50% central height loss. 3 mm retropulsion of the posterior inferior margin of L1 vertebral body with minimal impression on the thecal sac. Fracture does not extend into the pedicles or posterior elements. Remainder the vertebral body heights are maintained. No aggressive osseous lesion. No discitis or osteomyelitis. Paraspinal and other soft tissues: No focal paraspinal abnormality. Abdominal aortic atherosclerosis. Other: Osteoarthritis of bilateral sacroiliac joints. 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 Bilateral neural foramina are patent. Bilateral facet arthropathy at L1-2, L2-3, and L3-4. IMPRESSION: 1. Acute L1 vertebral body compression fracture with approximately 50% central height loss. Electronically Signed   By: Kathreen Devoid   On: 05/13/2017 13:28   Mr Lumbar Spine W Wo Contrast  Result Date: 05/27/2017 CLINICAL DATA:  Weakness. Altered mental status. Fall. L1 fracture. Chest mass and adenopathy. EXAM: MRI LUMBAR SPINE WITHOUT AND WITH CONTRAST TECHNIQUE: Multiplanar and multiecho pulse sequences of the lumbar spine were obtained without and with intravenous contrast. CONTRAST:  6mL MULTIHANCE GADOBENATE DIMEGLUMINE 529 MG/ML IV SOLN COMPARISON:  CT of the lumbar spine 05/13/2014. CT chest 05/23/2017. FINDINGS: Segmentation: 5 non rib-bearing lumbar type vertebral bodies are present. Alignment: AP alignment is anatomic.  Straightening of the normal lumbar lordosis is stable. Vertebrae: A pathologic compression fracture is present at L1. There is enhancing component posteriorly at L1 extending into the left pedicle. Edema is present throughout the vertebral body. A remote superior endplate fractures present at T12. Additional signal abnormality enhancement is present in the sacrum. This suggests a second lesion. No other focal marrow signal abnormality is present to suggest metastatic disease elsewhere. Conus medullaris and cauda equina: Conus extends to the L1 level. Conus and cauda equina appear normal. Paraspinal and other soft tissues: Heterogeneous collections are now present within the psoas musculature bilaterally. This appears to originate at the L1 level. There is no definite enhancement associated with the collections. These are new since the CT scan. Disc levels: Disc signal is preserved.  Disc infection is considered unlikely. T11-12: Mild disc bulging is present without significant stenosis. T12-L1: Retropulsed bone or soft tissue associated with the L1 compression fracture effaces the ventral CSF with moderate central canal narrowing. Mild foraminal narrowing is present bilaterally. L1-2: Retropulsed bone effaces the ventral CSF above this level. Mild foraminal narrowing is present bilaterally. L2-3: Mild disc bulging is present. There is no significant stenosis. L3-4: Mild disc bulging is present there is no significant stenosis. L4-5: A broad-based disc protrusion is present. Mild left subarticular narrowing is present. Foramina are patent bilaterally. L5-S1: A leftward disc protrusion is present without significant stenosis. IMPRESSION: 1. Enhancing lesion within the posterior aspect of the L1 fracture with extraosseous extension suggesting metastatic disease and pathologic fracture. 2. Focal signal abnormality and enhancement at S3 likely also represents focal metastasis. 3. New mixed intensity collections within  the psoas musculature bilaterally compatible with hemorrhage. Infection is considered unlikely. 4. Retropulsed bone narrows the spinal canal with mild central canal stenosis at L1. 5. Mild foraminal narrowing bilaterally at T12-L1 and L1-2. Electronically Signed   By: San Morelle M.D.   On: 05/27/2017 13:31  Nm Pet Image Initial (pi) Skull Base To Thigh  Result Date: 05/29/2017 CLINICAL DATA:  Initial treatment strategy for right upper lobe mass with satellite nodules. Possible pathologic fracture at L1. EXAM: NUCLEAR MEDICINE PET SKULL BASE TO THIGH TECHNIQUE: 12.3 mCi F-18 FDG was injected intravenously. Full-ring PET imaging was performed from the skull base to thigh after the radiotracer. CT data was obtained and used for attenuation correction and anatomic localization. FASTING BLOOD GLUCOSE:  Value: 79 mg/dl COMPARISON:  Multiple exams, including 05/23/2017 CT scan and 05/27/2017 MRI FINDINGS: NECK Accentuated activity along the cerebellar vermis on the top most images is likely artifactual. There is no lesion in this vicinity on prior head CT dated 05/13/2017. No hypermetabolic lymph nodes in the neck. Right mastoid effusion with fluid filling all visible air cells. Acute sphenoid sinusitis. Chronic left maxillary sinusitis with frothy material in the sinus. Bilateral carotid atherosclerotic calcification. CHEST 3.9 by 2.9 cm right upper lobe mass abutting the mediastinal margin is hypermetabolic, especially along its periphery, maximum SUV 10.7. The adjacent satellite nodules are also hypermetabolic with maximum SUV 6.9. There is atelectasis of the left lower lobe with an adjacent pleural effusion. Small amount of accentuated metabolic activity inferomedially along the left lower lobe with maximum SUV 4.7, uncertain significance. There are multifocal accentuated regions of activity in the esophagus. In the upper esophagus, maximum SUV is 6.6. Focal mid esophageal activity maximum SUV 4.5.  Distal esophageal activity maximum SUV 4.9. There can be some overlap of physiological and malignant esophageal activity in this range. Right lower paratracheal 1.0 cm in short axis lymph node with maximum SUV 3.1. Background mediastinal blood pool activity 2.5. Coronary, aortic arch, and branch vessel atherosclerotic vascular disease. ABDOMEN/PELVIS No abnormal hypermetabolic activity within the liver, pancreas, adrenal glands, or spleen. No hypermetabolic lymph nodes in the abdomen or pelvis. Aortoiliac atherosclerotic vascular disease. Psoas muscle abnormalities discussed under the skeletal section below. Low-grade presacral edema. Mild prominence of stool in the rectal vault. SKELETON Eccentric to the left at the C4-5 intervertebral level there is accentuated metabolic activity with maximum SUV 6.3. Looking back at the cervical spine CT from 05/13/2017 even in retrospect I do not see evidence of a metastatic lesion or other significant findings aside from spondylosis. Cause of this accentuated activity is uncertain. There is accentuated activity associated with an acute fracture the right distal clavicle, maximum SUV 6.4. This is most likely a benign fracture. Although there is some lucency in the bone this is probably from early hyperemic healing response. Probable fracture of the right third rib anteriorly with some mild irregularity of the costochondral junction, and accentuated metabolic activity with maximum SUV 3.4. There is abnormal accentuated metabolic activity posteriorly in the L1 vertebral body with maximum SUV 8.0. In addition, there is some focal accentuated activity centrally in the T12 vertebral body with maximum SUV 8.1, this did not have a correlate of finding on the MRI aside perhaps from some superior endplate chronic compression at T12. There is some faint focal accentuated metabolic activity centrally in the L2 vertebral body, maximum SUV 5.6. At the L1 level there is notable paraspinal  accentuated metabolic activity tracking down along the psoas muscles. There faint dependent calcifications in the psoas muscles which were not present on the CT scan of 05/13/2017, this may represent dystrophic calcification. Presumably the paraspinal and muscular activity may represent acute muscle injury and healing response, although clearly entity such as infection or even tumor cannot be readily excluded given the high  metabolic activity. There is some central lower activity in the psoas muscles along the known collections. There is some vague metabolic activity in the sacrum, likely associated with the patient's suspected subtle sacral fracture, maximum SUV is approximately 3.6. Presacral stranding noted. IMPRESSION: 1. 3.9 cm hypermetabolic right upper lobe centrally necrotic mass abutting the mediastinum, with hypermetabolic adjacent satellite nodules in the right upper lobe. Mildly hypermetabolic right paratracheal lymph node with maximum SUV 3.1. 2. Separate bandlike focus of abnormal metabolic activity medially in the left lower lobe is suspicious for potential metastatic disease. However, the entire left lower lobe is atelectatic and there is an adjacent small left pleural effusion, and the activity medially in the left lower lobe is fairly low-grade. 3. Hypermetabolic activity posteriorly along the L1 compression fracture in the vicinity of concern on prior MRI, suspicious for osseous metastatic spread. That said, there is also prominent metabolic activity in the paraspinal tissues and tracking along the psoas collections, and the distribution would be unusual for tumor spreading in the psoas muscles. There is some new calcifications along the psoas muscle cavities. The appearance could reflect hematoma with some organizing dystrophic calcifications and associated psoas muscle injuries contributing to the hypermetabolic activity, or conceivably psoas infection. Accentuated osseous metabolic activity  associated with several fractures including the right distal clavicle, right anterior third rib, and sacrum. 4. There are several foci of accentuated bony activity of uncertain significance. Specifically, on the left at C4-5 there is high metabolic activity but there was not a visible fracture or obvious lesion on CT scan of this region dated 05/13/2017. Similarly, there is accentuated metabolic activity centrally in the T12 and L2 vertebral bodies without appreciable corresponding lesion on the MRI from 05/27/2017. The cause of these other regions of accentuated metabolic activity is uncertain given the lack of corroborating findings on the other imaging studies. 5. Multifocal activity in the esophagus, probably physiologic in this setting, less likely to be from esophageal neoplasm. 6. Other imaging findings of potential clinical significance: Right mastoid effusion. Acute sphenoid sinusitis. Chronic left maxillary sinusitis. Aortic Atherosclerosis (ICD10-I70.0). Coronary atherosclerosis. Electronically Signed   By: Van Clines M.D.   On: 05/29/2017 13:43       Today   Subjective:   Linda Walsh patient still has some pain but comfortable Objective:   Blood pressure 127/64, pulse 96, temperature 98.4 F (36.9 C), temperature source Oral, resp. rate 20, height 4\' 8"  (1.422 m), weight 105 lb (47.6 kg), SpO2 93 %.  .  Intake/Output Summary (Last 24 hours) at 06/01/2017 1324 Last data filed at 06/01/2017 0644 Gross per 24 hour  Intake 10 ml  Output 200 ml  Net -190 ml    Exam VITAL SIGNS: Blood pressure 127/64, pulse 96, temperature 98.4 F (36.9 C), temperature source Oral, resp. rate 20, height 4\' 8"  (1.422 m), weight 105 lb (47.6 kg), SpO2 93 %.  GENERAL:  77 y.o.-year-old patient lying in the bed with no acute distress.  EYES: Pupils equal, round, reactive to light and accommodation. No scleral icterus. Extraocular muscles intact.  HEENT: Head atraumatic, normocephalic. Oropharynx  and nasopharynx clear.  NECK:  Supple, no jugular venous distention. No thyroid enlargement, no tenderness.  LUNGS: Normal breath sounds bilaterally, no wheezing, rales,rhonchi or crepitation. No use of accessory muscles of respiration.  CARDIOVASCULAR: S1, S2 normal. No murmurs, rubs, or gallops.  ABDOMEN: Soft, nontender, nondistended. Bowel sounds present. No organomegaly or mass.  EXTREMITIES: No pedal edema, cyanosis, or clubbing.  NEUROLOGIC: Cranial nerves  II through XII are intact. Muscle strength 5/5 in all extremities. Sensation intact. Gait not checked.  PSYCHIATRIC: The patient is alert and oriented x 3.  SKIN: No obvious rash, lesion, or ulcer.   Data Review     CBC w Diff:  Lab Results  Component Value Date   WBC 6.8 05/27/2017   HGB 10.6 (L) 05/27/2017   HCT 31.0 (L) 05/27/2017   PLT 424 05/27/2017   LYMPHOPCT 5 05/13/2017   MONOPCT 10 05/13/2017   EOSPCT 0 05/13/2017   BASOPCT 0 05/13/2017   CMP:  Lab Results  Component Value Date   NA 130 (L) 05/30/2017   K 3.5 05/30/2017   CL 93 (L) 05/30/2017   CO2 24 05/30/2017   BUN 16 05/30/2017   CREATININE 1.04 (H) 05/31/2017   PROT 7.9 05/13/2017   ALBUMIN 3.8 05/13/2017   BILITOT 1.0 05/13/2017   ALKPHOS 65 05/13/2017   AST 45 (H) 05/13/2017   ALT 18 05/13/2017  .  Micro Results Recent Results (from the past 240 hour(s))  Blood Culture (routine x 2)     Status: Abnormal   Collection Time: 05/22/17  2:13 PM  Result Value Ref Range Status   Specimen Description   Final    BLOOD BLOOD RIGHT HAND Performed at [facility], [address] [place], [place] [address]    Special Requests   Final    BOTTLES DRAWN AEROBIC AND ANAEROBIC Blood Culture results may not be optimal due to an inadequate volume of blood received in culture bottles Performed at [facility], [address] [place], [place] [address]    Culture  Setup Time   Final    GRAM POSITIVE COCCI IN BOTH AEROBIC AND  ANAEROBIC BOTTLES CRITICAL RESULT CALLED TO, READ BACK BY AND VERIFIED WITH: DAVID BESANTI AT 2778 05/23/17.PMH Performed at [facility], [place] [address], [place], [place] [address]    Culture METHICILLIN RESISTANT STAPHYLOCOCCUS AUREUS (A)  Final   Report Status 05/25/2017 FINAL  Final   Organism ID, Bacteria METHICILLIN RESISTANT STAPHYLOCOCCUS AUREUS  Final      Susceptibility   Methicillin resistant staphylococcus aureus - MIC*    CIPROFLOXACIN <=0.5 SENSITIVE Sensitive     ERYTHROMYCIN >=8 RESISTANT Resistant     GENTAMICIN <=0.5 SENSITIVE Sensitive     OXACILLIN >=4 RESISTANT Resistant     TETRACYCLINE <=1 SENSITIVE Sensitive     VANCOMYCIN 1 SENSITIVE Sensitive     TRIMETH/SULFA <=10 SENSITIVE Sensitive     CLINDAMYCIN <=0.25 SENSITIVE Sensitive     RIFAMPIN <=0.5 SENSITIVE Sensitive     Inducible Clindamycin NEGATIVE Sensitive     * METHICILLIN RESISTANT STAPHYLOCOCCUS AUREUS  Blood Culture (routine x 2)     Status: Abnormal   Collection Time: 05/22/17  2:13 PM  Result Value Ref Range Status   Specimen Description   Final    BLOOD BLOOD RIGHT FOREARM Performed at [facility], [address], [place], [place] [address]    Special Requests   Final    BOTTLES DRAWN AEROBIC AND ANAEROBIC Blood Culture adequate volume Performed at [facility], [address] [place], [place] [address]    Culture  Setup Time   Final    GRAM POSITIVE COCCI IN BOTH AEROBIC AND ANAEROBIC BOTTLES CRITICAL RESULT CALLED TO, READ BACK BY AND VERIFIED WITH: DAVID BESANTI AT 0867 05/23/17.PMH Performed at [facility], [address] [place],  [address]    Culture (A)  Final    STAPHYLOCOCCUS AUREUS SUSCEPTIBILITIES PERFORMED ON PREVIOUS CULTURE WITHIN THE LAST 5 DAYS. Performed at [facility], [place] [address], [place], [place] [address]    Report Status 05/25/2017 FINAL  Final  Blood Culture ID Panel (Reflexed)     Status: Abnormal   Collection  Time: 05/22/17  2:13 PM  Result Value Ref Range Status   Enterococcus species NOT DETECTED NOT DETECTED Final   Listeria monocytogenes NOT DETECTED NOT DETECTED Final   Staphylococcus species DETECTED (A) NOT DETECTED Final    Comment: CRITICAL RESULT CALLED TO, READ BACK BY AND VERIFIED WITH: DAVID BESANTI AT 6389 05/23/17.PMH    Staphylococcus aureus DETECTED (A) NOT DETECTED Final    Comment: Methicillin (oxacillin)-resistant Staphylococcus aureus (MRSA). MRSA is predictably resistant to beta-lactam antibiotics (except ceftaroline). Preferred therapy is vancomycin unless clinically contraindicated. Patient requires contact precautions if  hospitalized. CRITICAL RESULT CALLED TO, READ BACK BY AND VERIFIED WITH: DAVID BESANTI AT 3734 05/23/17.PMH    Methicillin resistance DETECTED (A) NOT DETECTED Final    Comment: CRITICAL RESULT CALLED TO, READ BACK BY AND VERIFIED WITH: DAVID BESANTI AT 2876 05/23/17.PMH    Streptococcus species NOT DETECTED NOT DETECTED Final   Streptococcus agalactiae NOT DETECTED NOT DETECTED Final   Streptococcus pneumoniae NOT DETECTED NOT DETECTED Final   Streptococcus pyogenes NOT DETECTED NOT DETECTED Final   Acinetobacter baumannii NOT DETECTED NOT DETECTED Final   Enterobacteriaceae species NOT DETECTED NOT DETECTED Final   Enterobacter cloacae complex NOT DETECTED NOT DETECTED Final   Escherichia coli NOT DETECTED NOT DETECTED Final   Klebsiella oxytoca NOT DETECTED NOT DETECTED Final   Klebsiella pneumoniae NOT DETECTED NOT DETECTED Final   Proteus species NOT DETECTED NOT DETECTED Final   Serratia marcescens NOT DETECTED NOT DETECTED Final   Haemophilus influenzae NOT DETECTED NOT DETECTED Final   Neisseria meningitidis NOT DETECTED NOT DETECTED Final   Pseudomonas aeruginosa NOT DETECTED NOT DETECTED Final   Candida albicans NOT DETECTED NOT DETECTED Final   Candida glabrata NOT DETECTED NOT DETECTED Final   Candida krusei NOT DETECTED NOT DETECTED  Final   Candida parapsilosis NOT DETECTED NOT DETECTED Final   Candida tropicalis NOT DETECTED NOT DETECTED Final    Comment: Performed at [facility], [address] [place], [place] [address]  Culture, blood (Routine X 2) w Reflex to ID Panel     Status: None   Collection Time: 05/24/17  9:48 AM  Result Value Ref Range Status   Specimen Description BLOOD LEFT ANTECUBITAL  Final   Special Requests   Final    BOTTLES DRAWN AEROBIC AND ANAEROBIC Blood Culture adequate volume   Culture   Final    NO GROWTH 5 DAYS Performed at [facility], [address] [place], [place] [address]    Report Status 05/29/2017 FINAL  Final  Culture, blood (Routine X 2) w Reflex to ID Panel     Status: None   Collection Time: 05/24/17 10:04 AM  Result Value Ref Range Status   Specimen Description BLOOD BLOOD LEFT HAND  Final   Special Requests   Final    BOTTLES DRAWN AEROBIC AND ANAEROBIC Blood Culture adequate volume   Culture   Final    NO GROWTH 5 DAYS Performed at [facility], [address] [place], [place] [address]    Report Status 05/29/2017 FINAL  Final        Code Status Orders  (From admission, onward)  Start     Ordered   06/01/17 1208  Do not attempt resuscitation (DNR)  Continuous    Question Answer Comment  In the event of cardiac or respiratory ARREST Do not call a "code blue"   In the event of cardiac or respiratory ARREST Do not perform Intubation, CPR, defibrillation or ACLS   In the event of cardiac or respiratory ARREST Use medication by any route, position, wound care, and other measures to relive pain and suffering. May use oxygen, suction and manual treatment of airway obstruction as needed for comfort.   Comments Would like to be placed on ventilator for respiratory distress if needed.      06/01/17 1208    Code Status History    Date Active Date Inactive Code Status Order ID Comments User Context   05/29/2017 15:27 06/01/2017  12:08 DNR [id number]  Earlie Counts, NP Inpatient   05/28/2017 11:02 05/29/2017 15:27 Partial Code [id number] Conversation witnessed by Dr. Vianne Bulls. Asencion Gowda, NP Inpatient   05/22/2017 15:32 05/28/2017 11:01 Full Code [id number]  Henreitta Leber, MD Inpatient   05/13/2017 16:03 05/15/2017 22:17 Full Code [id number]  SalaryAvel Peace, MD Inpatient          Contact information for after-discharge care    Destination    [facility] .   Service:  Skilled Nursing Contact information: [address] [place] [phone]              Discharge Medications   Allergies as of 06/01/2017   No Known Allergies     Medication List    STOP taking these medications   aspirin EC 81 MG tablet   atorvastatin 20 MG tablet Commonly known as:  LIPITOR   BELSOMRA 15 MG Tabs Generic drug:  Suvorexant   budesonide 0.5 MG/2ML nebulizer solution Commonly known as:  PULMICORT   calcium-vitamin D 500-200 MG-UNIT tablet Commonly known as:  OSCAL WITH D   cetirizine 10 MG tablet Commonly known as:  ZYRTEC   feeding supplement (ENSURE ENLIVE) Liqd   ferrous sulfate 325 (65 FE) MG tablet   Fluticasone-Salmeterol 250-50 MCG/DOSE Aepb Commonly known as:  ADVAIR   hydroxychloroquine 200 MG tablet Commonly known as:  PLAQUENIL   metoprolol succinate 25 MG 24 hr tablet Commonly known as:  TOPROL-XL   mupirocin ointment 2 % Commonly known as:  BACTROBAN   pantoprazole 40 MG tablet Commonly known as:  PROTONIX   PROAIR HFA 108 (90 Base) MCG/ACT inhaler Generic drug:  albuterol   rOPINIRole 0.5 MG tablet Commonly known as:  REQUIP   senna-docusate 8.6-50 MG tablet Commonly known as:  Senokot-S   traMADol 50 MG tablet Commonly known as:  ULTRAM   VITAMIN D-1000 MAX ST 1000 units tablet Generic drug:  Cholecalciferol     TAKE these medications   acetaminophen 500 MG tablet Commonly known as:  TYLENOL Take 500 mg by mouth 2 (two)  times daily.   fentaNYL 25 MCG/HR patch Commonly known as:  DURAGESIC - dosed mcg/hr Place 1 patch (25 mcg total) onto the skin every 3 (three) days. Start taking on:  06/04/2017   LORazepam 1 MG tablet Commonly known as:  ATIVAN Take 1 tablet (1 mg total) by mouth every 4 (four) hours as needed for anxiety. What changed:    when to take this  reasons to take this   mirtazapine 30 MG tablet Commonly known as:  REMERON Take 30 mg by mouth at bedtime.  morphine 20 MG/ML concentrated solution Commonly known as:  ROXANOL Take 0.5 mLs (10 mg total) by mouth every 2 (two) hours as needed for severe pain.   ondansetron 4 MG disintegrating tablet Commonly known as:  ZOFRAN-ODT Take 1 tablet (4 mg total) by mouth every 6 (six) hours as needed for nausea.          Total Time in preparing paper work, data evaluation and todays exam - 49 minutes  Dustin Flock M.D on 06/01/2017 at 1:24 PM  [place]  [phone]

## 2017-06-01 NOTE — Progress Notes (Signed)
Advanced care plan.  Purpose of the Encounter: CODE STATUS  Parties in Attendance: Son and Patient  Patient's Decision Capacity: Intact  Subjective/Patient's story: Pt with metastatic lung cancer, and MRSA sepsis, doesn't not want biopsy or aggressive therapy    Objective/Medical story Discussed with patient regarding what options regarding her treatment   Goals of care determination:   Comfort care measures only      Time spent discussing advanced care planning: 16 minutes

## 2017-06-01 NOTE — Progress Notes (Signed)
Patient verbalizes nausea. Zofran given

## 2017-06-01 NOTE — Progress Notes (Addendum)
New hospice home referral received from [facility]. Patient is a 77 year old woman with a known history of rheumatoid arthritis, Takasubo cardiomyopathy, COPD, depression, CHF, and kidney disorder admitted to [facility] on 2/15 with increasing weakness and poor oral intake. This is her second admission this month. She was previously admitted on 2/6 following a fall in which she sustained a fractured right clavicle and L1 compression fracture. She was found on this admission to have a UTI with MRSA bacteremia and well as a new lung mass with suspected metastatic disease by PET scan. She has had increased pain and continues with very poor oral intake. Patient and her son met again today with Palliative Medicine NP Mariana Kaufman and have chosen to focus on her comfort with transfer to the [place]. No further testing or invasive procedures planned. Fentanyl patch was increased from 12.5 to 25 mcg as patient has required 7 doses of IV morphine in the past 24 hours. Writer met in the family room with patient's son Audry Pili to initiate education regarding hospice services, philosophy and team approach to care with understanding voiced. Questions answered, consents signed. Audry Pili has no transportation to get to the hospice home today to complete further paper work, plan is for discharge tomorrow. Hospital care team updated. Patient seen lying in bed, alert, agreeable to discharged plans and focus on comfort, she did complain of intense "rectal pain". Of note there is no bowel movement charted since 2/13. She has received Miralax and a suppository this afternoon. Staff RN Susana notified. Patient information faxed to referral. Will continue to follow through final disposition. Flo Shanks RN, BSN, [facility], [facility] [phone]

## 2017-06-02 NOTE — Discharge Summary (Signed)
[place] [facility], 77 y.o., DOB 1941-02-25, MRN [id number]. Admission date: 05/22/2017 Discharge Date 06/02/2017 Primary MD Remi Haggard, FNP Admitting Physician Henreitta Leber, MD  Admission Diagnosis  Hypokalemia [E87.6] Urinary tract infection, acute [N39.0] Sepsis, due to unspecified organism [facility]) [A41.9]  Discharge Diagnosis   Principal Problem: MRSA bacteremia Metastatic lung cancer Severe hypokalemia Asthma History of rheumatoid arthritis Restless leg syndrome Essential hypertension Pressure ulcer on the right gluteal region Klebsiella UTI   Hospital Course -year-old female with past medical history of rheumatoid arthritis, recent L1 fracture, right clavicle fracture managed nonoperatively and also has asthma, anxiety, restless leg syndrome comes from group home because of generalized weakness, poor p.o. intake and found to have severe hypokalemia, UTI.  Further evaluation revealed that patient had MRSA sepsis.  She was started on IV antibiotics.  To look for source of the sepsis she underwent a CT scan of the chest and abdomen and was noted to have a large lung mass as well as metastatic disease in the lumbar spine confirmed by a PET scan.  Further discussions were held with the patient and her son and it was decided to make her DNR.  They did not want to pursue a biopsy or any chemo or any aggressive therapy.  They have chose to go to hospice home.               Consults  pulmonary/intensive care, oncology, and infectious disease  Significant Tests:  See full reports for all details     Dg Chest 2 View  Result Date: 05/22/2017 CLINICAL DATA:  Fatigue, recent flu EXAM: CHEST  2 VIEW COMPARISON:  05/13/2017 FINDINGS: Negative for heart failure. Lungs are clear without infiltrate or effusion. Atherosclerotic aorta. Normal heart size. L1 compression fracture unchanged. IMPRESSION: No active cardiopulmonary disease.  Electronically Signed   By: Franchot Gallo M.D.   On: 05/22/2017 13:28   Dg Chest 2 View  Result Date: 05/13/2017 CLINICAL DATA:  Cough, recent fall EXAM: CHEST  2 VIEW COMPARISON:  Chest x-ray of 05/12/2017 FINDINGS: The lungs remain somewhat hyperaerated but clear. No pneumonia or effusion is seen. Mediastinal hilar contours are unremarkable and the heart is within normal limits in size. The bones are osteopenic. There are diffuse degenerative changes in the shoulders. The right distal clavicle fracture with separation of the right AC joint is not as well seen by chest x-ray. IMPRESSION: 1. No active lung disease.  Slight hyper aeration. 2. The fracture of the distal right clavicle with dislocation of the right AC joint noted on right shoulder films is not as well seen by chest x-ray. 3. Degenerative changes of the shoulders. Electronically Signed   By: Ivar Drape M.D.   On: 05/13/2017 10:39   Dg Chest 2 View  Result Date: 05/13/2017 CLINICAL DATA:  77 year old female history of cough for 1 month. EXAM: CHEST  2 VIEW COMPARISON:  Chest x-ray 04/30/2016. FINDINGS: Lung volumes are normal. No consolidative airspace disease. No pleural effusions. No pneumothorax. No pulmonary nodule or mass noted. Pulmonary vasculature and the cardiomediastinal silhouette are within normal limits. Atherosclerosis in the thoracic aorta. IMPRESSION: 1.  No radiographic evidence of acute cardiopulmonary disease. 2. Aortic atherosclerosis. Electronically Signed   By: Vinnie Langton M.D.   On: 05/13/2017 08:47   Dg Lumbar Spine Complete  Result Date: 05/13/2017 CLINICAL DATA:  Cough, fell yesterday with some low back pain EXAM: LUMBAR SPINE - COMPLETE 4+ VIEW COMPARISON:  None. FINDINGS: The lumbar vertebrae are slightly straightened in alignment. There is what appears to be and acute compression deformity of L1 vertebral body of approximately 40%. There may be slight retropulsion at the level of the compression deformity. The  bones are osteopenic. There is degenerative disc disease at L4-5 with some loss of disc space. The SI joints are corticated. IMPRESSION: 1. Probable acute compression deformity of L1 vertebral body of approximately 40% with some retropulsion. 2. Diffuse osteopenia. 3. 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 Electronically Signed   By: Ivar Drape M.D.   On: 05/13/2017 10:26   Dg Pelvis 1-2 Views  Result Date: 05/13/2017 CLINICAL DATA:  [place] yesterday now with low back pain EXAM: PELVIS - 1-2 VIEW COMPARISON:  None FINDINGS: There is mild degenerative joint disease with some loss of joint space bilaterally. No hip fracture is seen. There appears to be and old left inferior pubic ramus fracture but clinical correlation is recommended. Also there is a faint lucency through the right pubic ramus most likely overlapping, but a subtle fracture cannot be excluded. The pubic ramus is in normal position. The SI joints are corticated. IMPRESSION: 1. Probable old fracture of the left inferior pelvic ramus. Correlate clinically. 2. Vague lucency through the right pubic ramus may be due to overlapping structures, but correlate clinically or consider CT of the pelvis to assess these areas further. Electronically Signed   By: Ivar Drape M.D.   On: 05/13/2017 12:10   Dg Shoulder Right  Result Date: 05/13/2017 CLINICAL DATA:  Recent fall, back pain EXAM: RIGHT SHOULDER - 2+ VIEW COMPARISON:  Chest x-ray of 05/13/2017 and 05/12/2017 FINDINGS: Fracture separation of the right [facility] joint is noted. There is a significant degenerative joint disease of the right glenohumeral joint with loss of joint space and spurring. Cortical discontinuity of the posterior right seventh rib is noted which could represent fracture or old deformity. IMPRESSION: 1. Fracture/separation of the right [facility] joint which may be acute. Correlate clinically. 2. 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 Electronically Signed    By: Ivar Drape M.D.   On: 05/13/2017 10:29   Ct Head Wo Contrast  Result Date: 05/13/2017 CLINICAL DATA:  Fall, posterior skull injury, hematoma.  Neck pain. EXAM: CT HEAD WITHOUT CONTRAST CT CERVICAL SPINE WITHOUT CONTRAST TECHNIQUE: Multidetector CT imaging of the head and cervical spine was performed following the standard protocol without intravenous contrast. Multiplanar CT image reconstructions of the cervical spine were also generated. COMPARISON:  None. FINDINGS: CT HEAD FINDINGS Brain: No evidence of acute infarction, hemorrhage, hydrocephalus, extra-axial collection or mass lesion/mass effect. Mild cortical atrophy. Subcortical white matter and periventricular small vessel ischemic changes. Vascular: Intracranial atherosclerosis. Skull: Normal. Negative for fracture or focal lesion. Sinuses/Orbits: Partial opacification of the bilateral ethmoid and sphenoid sinuses. Opacification of the right mastoid air cells. Other: Mild soft tissue swelling/hematoma overlying the right posterior vertex. CT CERVICAL SPINE FINDINGS Alignment: Normal cervical lordosis. Skull base and vertebrae: No acute fracture. No primary bone lesion or focal pathologic process. Soft tissues and spinal canal: No prevertebral fluid or swelling. No visible canal hematoma. Disc levels:  Mild degenerative changes of the mid cervical spine. Spinal canal is patent. Upper chest: Visualized lung apices are notable for biapical pleural-parenchymal scarring. Other: Visualized thyroid is unremarkable. IMPRESSION: Mild soft tissue swelling/hematoma overlying the right posterior vertex. No evidence of calvarial fracture. No evidence of acute intracranial abnormality. Atrophy with small vessel ischemic changes. Opacification of the right mastoid  air cells. No evidence of traumatic injury to the cervical spine. Electronically Signed   By: Julian Hy M.D.   On: 05/13/2017 11:28   Ct Angio Chest Pe W Or Wo Contrast  Result Date:  05/23/2017 CLINICAL DATA:  Evaluate for pulmonary embolus. EXAM: CT ANGIOGRAPHY CHEST WITH CONTRAST TECHNIQUE: Multidetector CT imaging of the chest was performed using the standard protocol during bolus administration of intravenous contrast. Multiplanar CT image reconstructions and MIPs were obtained to evaluate the vascular anatomy. CONTRAST:  184mL ISOVUE-370 IOPAMIDOL (ISOVUE-370) INJECTION 76% COMPARISON:  None. FINDINGS: Cardiovascular: The heart size appears normal. No pericardial effusion. Aortic atherosclerosis noted. Calcification within the LAD coronary artery is noted. The main pulmonary artery appears patent. No saddle embolus or central obstructing emboli identified. No lobar or segmental pulmonary artery filling defects identified. Mediastinum/Nodes: Normal appearance of the thyroid gland. The trachea appears patent and is midline. Normal appearance of the esophagus. No enlarged mediastinal or hilar lymph nodes. Lungs/Pleura: No pleural effusion. Dependent changes and subsegmental atelectasis noted in the left base. Within the medial right upper lobe there is a necrotic appearing lung mass which extends into the posterior mediastinum posterior to the trachea and has mass effect upon the proximal esophagus. This measures 3.0 by 4.0 by 3.9 cm. Several satellite nodules are identified within the right upper lobe, the largest appears cavitary measuring 1.2 cm, image 17 of series 10. Upper Abdomen: No acute abnormality. Musculoskeletal: No suspicious bone lesions. No acute bone abnormality. Review of the MIP images confirms the above findings. IMPRESSION: 1. Negative for acute pulmonary embolus. 2. Mass within the medial right upper lobe is identified invading the posterior mediastinum which is suspicious for primary bronchogenic carcinoma. Several additional satellite nodules are noted within the right upper lobe. Further evaluation with PET-CT and tissue sampling is advised. 3. Aortic atherosclerosis  and coronary artery calcifications. Aortic Atherosclerosis (ICD10-I70.0). Electronically Signed   By: Kerby Moors M.D.   On: 05/23/2017 11:32   Ct Cervical Spine Wo Contrast  Result Date: 05/13/2017 CLINICAL DATA:  Fall, posterior skull injury, hematoma.  Neck pain. EXAM: CT HEAD WITHOUT CONTRAST CT CERVICAL SPINE WITHOUT CONTRAST TECHNIQUE: Multidetector CT imaging of the head and cervical spine was performed following the standard protocol without intravenous contrast. Multiplanar CT image reconstructions of the cervical spine were also generated. COMPARISON:  None. FINDINGS: CT HEAD FINDINGS Brain: No evidence of acute infarction, hemorrhage, hydrocephalus, extra-axial collection or mass lesion/mass effect. Mild cortical atrophy. Subcortical white matter and periventricular small vessel ischemic changes. Vascular: Intracranial atherosclerosis. Skull: Normal. Negative for fracture or focal lesion. Sinuses/Orbits: Partial opacification of the bilateral ethmoid and sphenoid sinuses. Opacification of the right mastoid air cells. Other: Mild soft tissue swelling/hematoma overlying the right posterior vertex. CT CERVICAL SPINE FINDINGS Alignment: Normal cervical lordosis. Skull base and vertebrae: No acute fracture. No primary bone lesion or focal pathologic process. Soft tissues and spinal canal: No prevertebral fluid or swelling. No visible canal hematoma. Disc levels:  Mild degenerative changes of the mid cervical spine. Spinal canal is patent. Upper chest: Visualized lung apices are notable for biapical pleural-parenchymal scarring. Other: Visualized thyroid is unremarkable. IMPRESSION: Mild soft tissue swelling/hematoma overlying the right posterior vertex. No evidence of calvarial fracture. No evidence of acute intracranial abnormality. Atrophy with small vessel ischemic changes. Opacification of the right mastoid air cells. No evidence of traumatic injury to the cervical spine. Electronically Signed   By:  Julian Hy M.D.   On: 05/13/2017 11:28   Ct Lumbar  Spine Wo Contrast  Result Date: 05/13/2017 CLINICAL DATA:  Status post fall yesterday.  Back pain. EXAM: CT LUMBAR SPINE WITHOUT CONTRAST TECHNIQUE: Multidetector CT imaging of the lumbar spine was performed without intravenous contrast administration. Multiplanar CT image reconstructions were also generated. COMPARISON:  None. FINDINGS: Segmentation: 5 lumbar type vertebrae. Alignment: Normal. Vertebrae: Generalized osteopenia. L1 compression fracture with approximately 50% central height loss. 3 mm retropulsion of the posterior inferior margin of L1 vertebral body with minimal impression on the thecal sac. Fracture does not extend into the pedicles or posterior elements. Remainder the vertebral body heights are maintained. No aggressive osseous lesion. No discitis or osteomyelitis. Paraspinal and other soft tissues: No focal paraspinal abnormality. Abdominal aortic atherosclerosis. Other: Osteoarthritis of bilateral sacroiliac joints. 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 Bilateral neural foramina are patent. Bilateral facet arthropathy at L1-2, L2-3, and L3-4. IMPRESSION: 1. Acute L1 vertebral body compression fracture with approximately 50% central height loss. Electronically Signed   By: Kathreen Devoid   On: 05/13/2017 13:28   Mr Lumbar Spine W Wo Contrast  Result Date: 05/27/2017 CLINICAL DATA:  Weakness. Altered mental status. Fall. L1 fracture. Chest mass and adenopathy. EXAM: MRI LUMBAR SPINE WITHOUT AND WITH CONTRAST TECHNIQUE: Multiplanar and multiecho pulse sequences of the lumbar spine were obtained without and with intravenous contrast. CONTRAST:  29mL MULTIHANCE GADOBENATE DIMEGLUMINE 529 MG/ML IV SOLN COMPARISON:  CT of the lumbar spine 05/13/2014. CT chest 05/23/2017. FINDINGS: Segmentation: 5 non rib-bearing lumbar type vertebral bodies are present. Alignment: AP alignment is anatomic.  Straightening of the normal lumbar lordosis is stable. Vertebrae: A pathologic compression fracture is present at L1. There is enhancing component posteriorly at L1 extending into the left pedicle. Edema is present throughout the vertebral body. A remote superior endplate fractures present at T12. Additional signal abnormality enhancement is present in the sacrum. This suggests a second lesion. No other focal marrow signal abnormality is present to suggest metastatic disease elsewhere. Conus medullaris and cauda equina: Conus extends to the L1 level. Conus and cauda equina appear normal. Paraspinal and other soft tissues: Heterogeneous collections are now present within the psoas musculature bilaterally. This appears to originate at the L1 level. There is no definite enhancement associated with the collections. These are new since the CT scan. Disc levels: Disc signal is preserved.  Disc infection is considered unlikely. T11-12: Mild disc bulging is present without significant stenosis. T12-L1: Retropulsed bone or soft tissue associated with the L1 compression fracture effaces the ventral CSF with moderate central canal narrowing. Mild foraminal narrowing is present bilaterally. L1-2: Retropulsed bone effaces the ventral CSF above this level. Mild foraminal narrowing is present bilaterally. L2-3: Mild disc bulging is present. There is no significant stenosis. L3-4: Mild disc bulging is present there is no significant stenosis. L4-5: A broad-based disc protrusion is present. Mild left subarticular narrowing is present. Foramina are patent bilaterally. L5-S1: A leftward disc protrusion is present without significant stenosis. IMPRESSION: 1. Enhancing lesion within the posterior aspect of the L1 fracture with extraosseous extension suggesting metastatic disease and pathologic fracture. 2. Focal signal abnormality and enhancement at S3 likely also represents focal metastasis. 3. New mixed intensity collections within  the psoas musculature bilaterally compatible with hemorrhage. Infection is considered unlikely. 4. Retropulsed bone narrows the spinal canal with mild central canal stenosis at L1. 5. Mild foraminal narrowing bilaterally at T12-L1 and L1-2. Electronically Signed   By: San Morelle M.D.   On: 05/27/2017 13:31  Nm Pet Image Initial (pi) Skull Base To Thigh  Result Date: 05/29/2017 CLINICAL DATA:  Initial treatment strategy for right upper lobe mass with satellite nodules. Possible pathologic fracture at L1. EXAM: NUCLEAR MEDICINE PET SKULL BASE TO THIGH TECHNIQUE: 12.3 mCi F-18 FDG was injected intravenously. Full-ring PET imaging was performed from the skull base to thigh after the radiotracer. CT data was obtained and used for attenuation correction and anatomic localization. FASTING BLOOD GLUCOSE:  Value: 79 mg/dl COMPARISON:  Multiple exams, including 05/23/2017 CT scan and 05/27/2017 MRI FINDINGS: NECK Accentuated activity along the cerebellar vermis on the top most images is likely artifactual. There is no lesion in this vicinity on prior head CT dated 05/13/2017. No hypermetabolic lymph nodes in the neck. Right mastoid effusion with fluid filling all visible air cells. Acute sphenoid sinusitis. Chronic left maxillary sinusitis with frothy material in the sinus. Bilateral carotid atherosclerotic calcification. CHEST 3.9 by 2.9 cm right upper lobe mass abutting the mediastinal margin is hypermetabolic, especially along its periphery, maximum SUV 10.7. The adjacent satellite nodules are also hypermetabolic with maximum SUV 6.9. There is atelectasis of the left lower lobe with an adjacent pleural effusion. Small amount of accentuated metabolic activity inferomedially along the left lower lobe with maximum SUV 4.7, uncertain significance. There are multifocal accentuated regions of activity in the esophagus. In the upper esophagus, maximum SUV is 6.6. Focal mid esophageal activity maximum SUV 4.5.  Distal esophageal activity maximum SUV 4.9. There can be some overlap of physiological and malignant esophageal activity in this range. Right lower paratracheal 1.0 cm in short axis lymph node with maximum SUV 3.1. Background mediastinal blood pool activity 2.5. Coronary, aortic arch, and branch vessel atherosclerotic vascular disease. ABDOMEN/PELVIS No abnormal hypermetabolic activity within the liver, pancreas, adrenal glands, or spleen. No hypermetabolic lymph nodes in the abdomen or pelvis. Aortoiliac atherosclerotic vascular disease. Psoas muscle abnormalities discussed under the skeletal section below. Low-grade presacral edema. Mild prominence of stool in the rectal vault. SKELETON Eccentric to the left at the C4-5 intervertebral level there is accentuated metabolic activity with maximum SUV 6.3. Looking back at the cervical spine CT from 05/13/2017 even in retrospect I do not see evidence of a metastatic lesion or other significant findings aside from spondylosis. Cause of this accentuated activity is uncertain. There is accentuated activity associated with an acute fracture the right distal clavicle, maximum SUV 6.4. This is most likely a benign fracture. Although there is some lucency in the bone this is probably from early hyperemic healing response. Probable fracture of the right third rib anteriorly with some mild irregularity of the costochondral junction, and accentuated metabolic activity with maximum SUV 3.4. There is abnormal accentuated metabolic activity posteriorly in the L1 vertebral body with maximum SUV 8.0. In addition, there is some focal accentuated activity centrally in the T12 vertebral body with maximum SUV 8.1, this did not have a correlate of finding on the MRI aside perhaps from some superior endplate chronic compression at T12. There is some faint focal accentuated metabolic activity centrally in the L2 vertebral body, maximum SUV 5.6. At the L1 level there is notable paraspinal  accentuated metabolic activity tracking down along the psoas muscles. There faint dependent calcifications in the psoas muscles which were not present on the CT scan of 05/13/2017, this may represent dystrophic calcification. Presumably the paraspinal and muscular activity may represent acute muscle injury and healing response, although clearly entity such as infection or even tumor cannot be readily excluded given the high  metabolic activity. There is some central lower activity in the psoas muscles along the known collections. There is some vague metabolic activity in the sacrum, likely associated with the patient's suspected subtle sacral fracture, maximum SUV is approximately 3.6. Presacral stranding noted. IMPRESSION: 1. 3.9 cm hypermetabolic right upper lobe centrally necrotic mass abutting the mediastinum, with hypermetabolic adjacent satellite nodules in the right upper lobe. Mildly hypermetabolic right paratracheal lymph node with maximum SUV 3.1. 2. Separate bandlike focus of abnormal metabolic activity medially in the left lower lobe is suspicious for potential metastatic disease. However, the entire left lower lobe is atelectatic and there is an adjacent small left pleural effusion, and the activity medially in the left lower lobe is fairly low-grade. 3. Hypermetabolic activity posteriorly along the L1 compression fracture in the vicinity of concern on prior MRI, suspicious for osseous metastatic spread. That said, there is also prominent metabolic activity in the paraspinal tissues and tracking along the psoas collections, and the distribution would be unusual for tumor spreading in the psoas muscles. There is some new calcifications along the psoas muscle cavities. The appearance could reflect hematoma with some organizing dystrophic calcifications and associated psoas muscle injuries contributing to the hypermetabolic activity, or conceivably psoas infection. Accentuated osseous metabolic activity  associated with several fractures including the right distal clavicle, right anterior third rib, and sacrum. 4. There are several foci of accentuated bony activity of uncertain significance. Specifically, on the left at C4-5 there is high metabolic activity but there was not a visible fracture or obvious lesion on CT scan of this region dated 05/13/2017. Similarly, there is accentuated metabolic activity centrally in the T12 and L2 vertebral bodies without appreciable corresponding lesion on the MRI from 05/27/2017. The cause of these other regions of accentuated metabolic activity is uncertain given the lack of corroborating findings on the other imaging studies. 5. Multifocal activity in the esophagus, probably physiologic in this setting, less likely to be from esophageal neoplasm. 6. Other imaging findings of potential clinical significance: Right mastoid effusion. Acute sphenoid sinusitis. Chronic left maxillary sinusitis. Aortic Atherosclerosis (ICD10-I70.0). Coronary atherosclerosis. Electronically Signed   By: Van Clines M.D.   On: 05/29/2017 13:43       Today   Subjective:   Linda Walsh patient still has some pain but comfortable Objective:   Blood pressure (!) 132/57, pulse (!) 113, temperature 97.8 F (36.6 C), temperature source Oral, resp. rate 19, height 4\' 8"  (1.422 m), weight 105 lb (47.6 kg), SpO2 93 %.  .  Intake/Output Summary (Last 24 hours) at 06/02/2017 1038 Last data filed at 06/01/2017 2223 Gross per 24 hour  Intake 10 ml  Output -  Net 10 ml    Exam VITAL SIGNS: Blood pressure (!) 132/57, pulse (!) 113, temperature 97.8 F (36.6 C), temperature source Oral, resp. rate 19, height 4\' 8"  (1.422 m), weight 105 lb (47.6 kg), SpO2 93 %.  GENERAL:  77 y.o.-year-old patient lying in the bed with no acute distress.  EYES: Pupils equal, round, reactive to light and accommodation. No scleral icterus. Extraocular muscles intact.  HEENT: Head atraumatic, normocephalic.  Oropharynx and nasopharynx clear.  NECK:  Supple, no jugular venous distention. No thyroid enlargement, no tenderness.  LUNGS: Normal breath sounds bilaterally, no wheezing, rales,rhonchi or crepitation. No use of accessory muscles of respiration.  CARDIOVASCULAR: S1, S2 normal. No murmurs, rubs, or gallops.  ABDOMEN: Soft, nontender, nondistended. Bowel sounds present. No organomegaly or mass.  EXTREMITIES: No pedal edema, cyanosis, or clubbing.  NEUROLOGIC: Cranial nerves II through XII are intact. Muscle strength 5/5 in all extremities. Sensation intact. Gait not checked.  PSYCHIATRIC: The patient is alert and oriented x 3.  SKIN: No obvious rash, lesion, or ulcer.   Data Review     CBC w Diff:  Lab Results  Component Value Date   WBC 6.8 05/27/2017   HGB 10.6 (L) 05/27/2017   HCT 31.0 (L) 05/27/2017   PLT 424 05/27/2017   LYMPHOPCT 5 05/13/2017   MONOPCT 10 05/13/2017   EOSPCT 0 05/13/2017   BASOPCT 0 05/13/2017   CMP:  Lab Results  Component Value Date   NA 130 (L) 05/30/2017   K 3.5 05/30/2017   CL 93 (L) 05/30/2017   CO2 24 05/30/2017   BUN 16 05/30/2017   CREATININE 1.04 (H) 05/31/2017   PROT 7.9 05/13/2017   ALBUMIN 3.8 05/13/2017   BILITOT 1.0 05/13/2017   ALKPHOS 65 05/13/2017   AST 45 (H) 05/13/2017   ALT 18 05/13/2017  .  Micro Results Recent Results (from the past 240 hour(s))  Culture, blood (Routine X 2) w Reflex to ID Panel     Status: None   Collection Time: 05/24/17  9:48 AM  Result Value Ref Range Status   Specimen Description BLOOD LEFT ANTECUBITAL  Final   Special Requests   Final    BOTTLES DRAWN AEROBIC AND ANAEROBIC Blood Culture adequate volume   Culture   Final    NO GROWTH 5 DAYS Performed at [facility], [address] [place], [place] [address]    Report Status 05/29/2017 FINAL  Final  Culture, blood (Routine X 2) w Reflex to ID Panel     Status: None   Collection Time: 05/24/17 10:04 AM  Result Value Ref Range Status    Specimen Description BLOOD BLOOD LEFT HAND  Final   Special Requests   Final    BOTTLES DRAWN AEROBIC AND ANAEROBIC Blood Culture adequate volume   Culture   Final    NO GROWTH 5 DAYS Performed at [facility], [address], [place], [place] [address]    Report Status 05/29/2017 FINAL  Final        Code Status Orders  (From admission, onward)        Start     Ordered   06/01/17 1208  Do not attempt resuscitation (DNR)  Continuous    Question Answer Comment  In the event of cardiac or respiratory ARREST Do not call a "code blue"   In the event of cardiac or respiratory ARREST Do not perform Intubation, CPR, defibrillation or ACLS   In the event of cardiac or respiratory ARREST Use medication by any route, position, wound care, and other measures to relive pain and suffering. May use oxygen, suction and manual treatment of airway obstruction as needed for comfort.   Comments Would like to be placed on ventilator for respiratory distress if needed.      06/01/17 1208    Code Status History    Date Active Date Inactive Code Status Order ID Comments User Context   05/29/2017 15:27 06/01/2017 12:08 DNR [id number]  Earlie Counts, NP Inpatient   05/28/2017 11:02 05/29/2017 15:27 Partial Code [id number] Conversation witnessed by Dr. Vianne Bulls. Asencion Gowda, NP Inpatient   05/22/2017 15:32 05/28/2017 11:01 Full Code [id number]  Henreitta Leber, MD Inpatient   05/13/2017 16:03 05/15/2017 22:17 Full Code [id number]  SalaryAvel Peace, MD Inpatient  Contact information for after-discharge care    Destination    [facility] .   Service:  Skilled Nursing Contact information: [address] [place] [phone]              Discharge Medications   Allergies as of 06/02/2017   No Known Allergies     Medication List    STOP taking these medications   aspirin EC 81 MG tablet   atorvastatin 20 MG tablet Commonly  known as:  LIPITOR   BELSOMRA 15 MG Tabs Generic drug:  Suvorexant   budesonide 0.5 MG/2ML nebulizer solution Commonly known as:  PULMICORT   calcium-vitamin D 500-200 MG-UNIT tablet Commonly known as:  OSCAL WITH D   cetirizine 10 MG tablet Commonly known as:  ZYRTEC   feeding supplement (ENSURE ENLIVE) Liqd   ferrous sulfate 325 (65 FE) MG tablet   Fluticasone-Salmeterol 250-50 MCG/DOSE Aepb Commonly known as:  ADVAIR   hydroxychloroquine 200 MG tablet Commonly known as:  PLAQUENIL   metoprolol succinate 25 MG 24 hr tablet Commonly known as:  TOPROL-XL   mupirocin ointment 2 % Commonly known as:  BACTROBAN   pantoprazole 40 MG tablet Commonly known as:  PROTONIX   PROAIR HFA 108 (90 Base) MCG/ACT inhaler Generic drug:  albuterol   rOPINIRole 0.5 MG tablet Commonly known as:  REQUIP   senna-docusate 8.6-50 MG tablet Commonly known as:  Senokot-S   traMADol 50 MG tablet Commonly known as:  ULTRAM   VITAMIN D-1000 MAX ST 1000 units tablet Generic drug:  Cholecalciferol     TAKE these medications   acetaminophen 500 MG tablet Commonly known as:  TYLENOL Take 500 mg by mouth 2 (two) times daily.   fentaNYL 25 MCG/HR patch Commonly known as:  DURAGESIC - dosed mcg/hr Place 1 patch (25 mcg total) onto the skin every 3 (three) days. Start taking on:  06/04/2017   LORazepam 1 MG tablet Commonly known as:  ATIVAN Take 1 tablet (1 mg total) by mouth every 4 (four) hours as needed for anxiety. What changed:    when to take this  reasons to take this   mirtazapine 30 MG tablet Commonly known as:  REMERON Take 30 mg by mouth at bedtime.   morphine 20 MG/ML concentrated solution Commonly known as:  ROXANOL Take 0.5 mLs (10 mg total) by mouth every 2 (two) hours as needed for severe pain.   ondansetron 4 MG disintegrating tablet Commonly known as:  ZOFRAN-ODT Take 1 tablet (4 mg total) by mouth every 6 (six) hours as needed for nausea.           Total Time in preparing paper work, data evaluation and todays exam - 84 minutes  Dustin Flock M.D on 06/02/2017 at 10:38 AM  [facility]   Office  [phone]

## 2017-06-02 NOTE — Progress Notes (Signed)
Patient is preparing to discharge to [facility] for comfort care.    Dressing to sacral area change.  Allewyn applied for protection.  Dressing to right buttock wound also changed.  Wound is covered with a light yellow fibrin slough.  Santyl applied with cover dressing and Allewyn.  Note a new area approximately one cm from wound.  Area is 0.75 cm x 0.25 cm covered with yellow fibrin.  It was covered by the Allewyn dressing.  BBR

## 2017-06-02 NOTE — Progress Notes (Signed)
EMS here for pt transfer. AVS in DC package. IV left in place. Patient helped into the stretcher. Patient belongings packed including back brace.

## 2017-06-02 NOTE — Progress Notes (Signed)
Nutrition Brief Follow-Up Note  Chart reviewed. Patient now transitioning to comfort care.   No further nutrition interventions warranted at this time.  Please re-consult RD as needed.   Willey Blade, MS, [place], LDN Office: [phone] Pager: [phone] After Hours/Weekend Pager: [phone]

## 2017-06-02 NOTE — Progress Notes (Signed)
[place] [facility] [place] NAME: Linda Walsh    MR#:  [id number]  DATE OF BIRTH:  Sep 18, 1940  CHIEF COMPLAINT:     Chief Complaint  Patient presents with  . Failure To Thrive   Pain little better  REVIEW OF SYSTEMS:   ROS CONSTITUTIONAL: Cough, complains of back pain EYES: No blurred or double vision.  EARS, NOSE, AND THROAT: No tinnitus or ear pain.  RESPIRATORY: Cough,  No shortness of breath, wheezing or hemoptysis.  CARDIOVASCULAR: No chest pain, orthopnea, edema.  GASTROINTESTINAL: No nausea, vomiting, diarrhea or abdominal pain.  Poor p.o. intake GENITOURINARY: No dysuria, hematuria.  ENDOCRINE: No polyuria, nocturia,  HEMATOLOGY: No anemia, easy bruising or bleeding SKIN: Small decubiti on the right buttock MUSCULOSKELETAL: Diffuse joint pains NEUROLOGIC: No tingling, numbness, weakness.  PSYCHIATRY: No anxiety or depression.   DRUG ALLERGIES:  No Known Allergies  VITALS:  Blood pressure (!) 132/57, pulse (!) 113, temperature 97.8 F (36.6 C), temperature source Oral, resp. rate 19, height 4\' 8"  (1.422 m), weight 105 lb (47.6 kg), SpO2 93 %.  PHYSICAL EXAMINATION:  GENERAL:  77 y.o.-year-old patient lying in the bed with no acute distress.  Very  Frail individual EYES: Pupils equal, round, reactive to light.No scleral icterus. Extraocular muscles intact.  HEENT: Head atraumatic, normocephalic. Oropharynx and nasopharynx clear.  NECK:  Supple, no jugular venous distention. No thyroid enlargement, no tenderness.  LUNGS: Normal breath sounds bilaterally, no wheezing, rales,rhonchi or crepitation. No use of accessory muscles of respiration.  CARDIOVASCULAR: S1, S2 tachycardic.Marland Kitchen No murmurs, rubs, or gallops.  ABDOMEN: Soft, nontender, nondistended. Bowel sounds present. No organomegaly or mass.  EXTREMITIES: No pedal edema, cyanosis, or clubbing.  NEUROLOGIC: Cranial nerves II through XII are intact. Muscle strength 5/5  in all extremities. Sensation intact. Gait not checked.  PSYCHIATRIC: The patient is alert and oriented x 3.  SKIN: Sacral decubiti on the right buttock with some eschar in the center, slight pus around the periphery but patient has no tenderness the size around 3-2 cm.  Consult wound care for staging, dressing recommendations.   LABORATORY PANEL:   CBC Recent Labs  Lab 05/27/17 0306  WBC 6.8  HGB 10.6*  HCT 31.0*  PLT 424   ------------------------------------------------------------------------------------------------------------------  Chemistries  Recent Labs  Lab 05/27/17 0306  05/30/17 0408 05/31/17 1115  NA 132*   < > 130*  --   K 3.9   < > 3.5  --   CL 97*   < > 93*  --   CO2 27   < > 24  --   GLUCOSE 96   < > 107*  --   BUN 8   < > 16  --   CREATININE 0.39*   < > 0.96 1.04*  CALCIUM 7.7*   < > 8.0*  --   MG 1.9  --   --   --    < > = values in this interval not displayed.   ------------------------------------------------------------------------------------------------------------------  Cardiac Enzymes No results for input(s): TROPONINI in the last 168 hours. ------------------------------------------------------------------------------------------------------------------  RADIOLOGY:  No results found.  EKG:   Orders placed or performed during the hospital encounter of 05/22/17  . ED EKG  . ED EKG  . EKG 12-Lead  . EKG 12-Lead  . EKG    ASSESSMENT AND PLAN:  77 year old female with past medical history of rheumatoid arthritis, recent L1 fracture, right clavicle fracture managed nonoperatively and also has asthma, anxiety, restless  leg syndrome comes from group home because of generalized weakness, poor p.o. intake and found to have severe hypokalemia, UTI. 1. MRSA bacteremia:d/w patient and son comfort care, stop antibiotics  2.  Severe hypokalemia secondary to poor p.o. intake.  Potassium replaced  3.  Metastatic lung cancer: Comfort care  4.   Hyperlipidemia: discontinue atorvastatin.  5.  Asthma: No wheezing.  But does have cough.  Continue bronchodilators, Tussionex.,    6.  History of rheumatoid arthritis: discontinue Plaquenil.,    7.  Restless leg syndrome: Continue Requip.  8.Essential hypertension stop all meds  9.  pressure ulcer on the right gluteal region: C wound care consulted, recommended daily dressing changes a with Santyl.  10.  Klebsiella UTI s/p treatment      All the records are reviewed and case discussed with Care Management/Social Workerr. Management plans discussed with the patient, family and they are in agreement.  CODE STATUS: partial  TOTAL TIME TAKING CARE OF THIS PATIENT: 32 minutes.   Prognosis poor, high risk for cardiac arrest.  Dustin Flock M.D on 06/02/2017 at 10:40 AM  Between 7am to 6pm - Pager - [phone]  After 6pm go to [email or web address] - password [email or web address]  Office  [phone]  CC: Primary care physician; Remi Haggard, FNP   Note: This dictation was prepared with Dragon dictation along with smaller phrase technology. Any transcriptional errors that result from this process are unintentional.

## 2017-06-02 NOTE — Progress Notes (Signed)
Call placed to [place] at [facility] to update on recent comfort medications and dressing changes.  PICC line left and saline lock right remain in place.  BBR

## 2017-06-02 NOTE — Progress Notes (Signed)
Follow up visit made to new hospice home referral. Patient seen appeared to be sleeping. Continues with poor appetite, drinking fluids only. Did have several bowel movements after suppository given yesterday. Per chart note review patient received 50 mg of tramadol this morning for back pain. Per discussion with staff RN Lonna Duval this was not effective and patient will be given PRN morphine next. Report called to the hospice home, EMS notified for transport. Writer contacted patient's son Linda Walsh via telephone, message left regarding discharge. Hospital care team updated. Signed DNR in place in discharge packet.  Linda Shanks RN, BSN, [facility] [place], hospital liaison [phone]

## 2017-06-10 DIAGNOSIS — M549 Dorsalgia, unspecified: Secondary | ICD-10-CM

## 2017-08-05 DEATH — deceased

## 2019-05-15 IMAGING — CT CT CERVICAL SPINE W/O CM
4 of 6 series · 15 of 33 positions shown, 16 images · non-contrast
Comparison: None.

CLINICAL DATA: Fall, posterior skull injury, hematoma.  Neck pain.

EXAM:
CT HEAD WITHOUT CONTRAST
CT CERVICAL SPINE WITHOUT CONTRAST
TECHNIQUE: Multidetector CT imaging of the head and cervical spine was
performed following the standard protocol without intravenous
contrast. Multiplanar CT image reconstructions of the cervical spine
were also generated.

[Series 4: head bone · axial · 0.41mm/px · z∈[-144,-64]mm · 4 of 68 slices shown]
[im 14/68  bone]
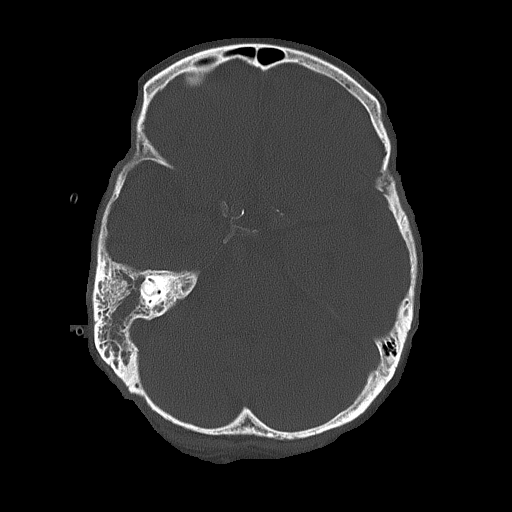
[im 27/68  bone]
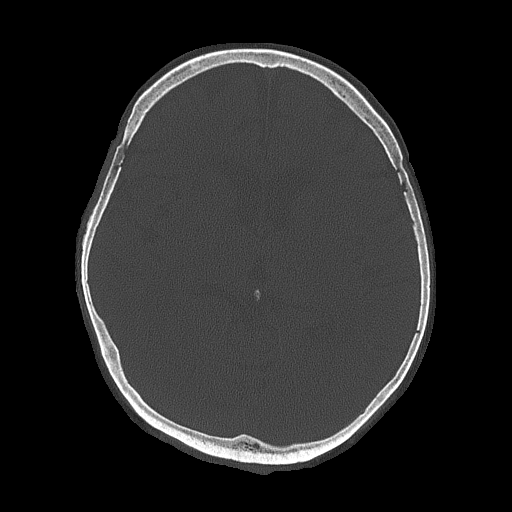
[im 41/68  bone]
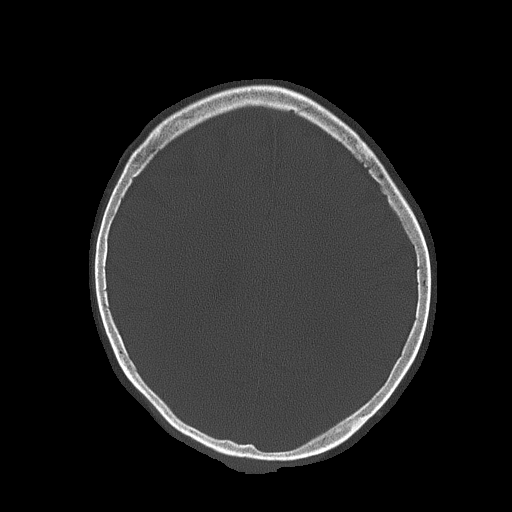
[im 54/68  bone]
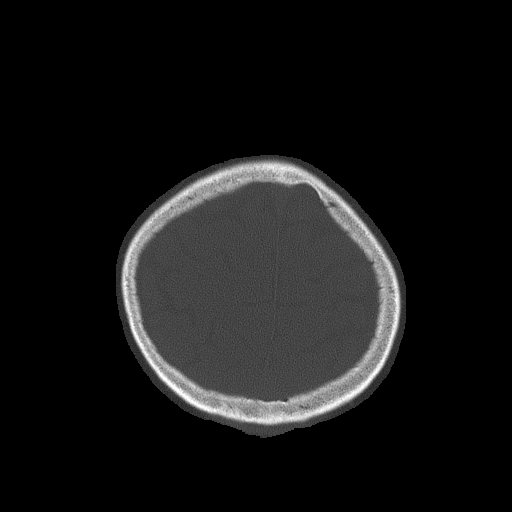

[Series 5: coronal soft tissue · coronal · 0.29mm/px · 3 of 64 slices shown]
[im 13/64  bone]
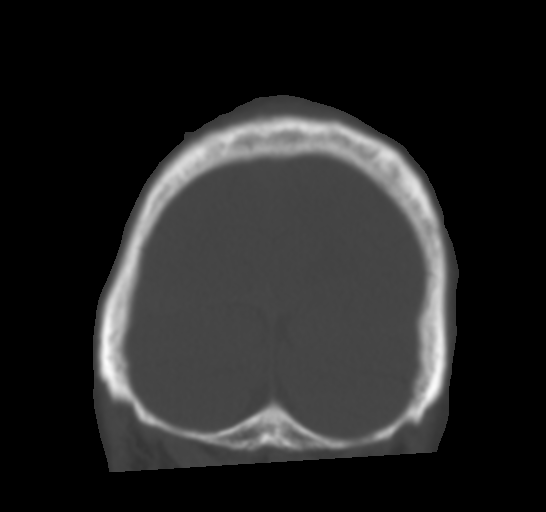
[im 26/64  bone]
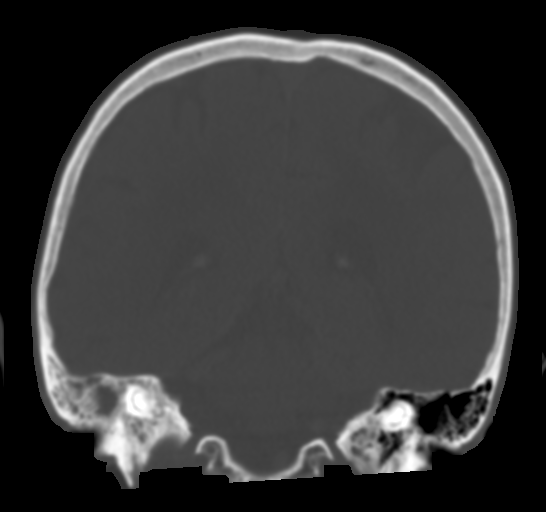
[im 38/64  bone]
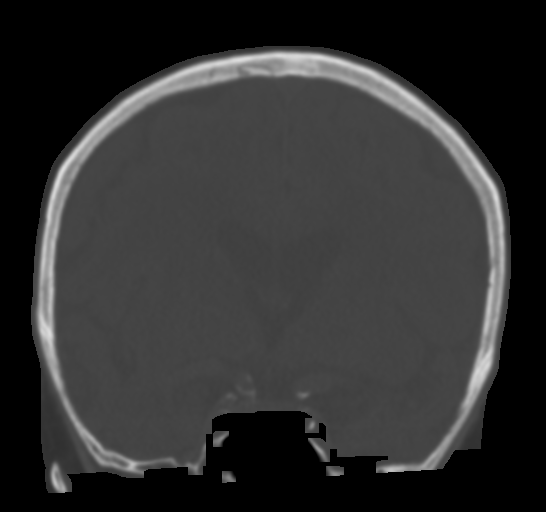

[Series 7: sagittal bone · sagittal · 0.28mm/px · 5 of 70 slices shown]
[im 10/70  bone]
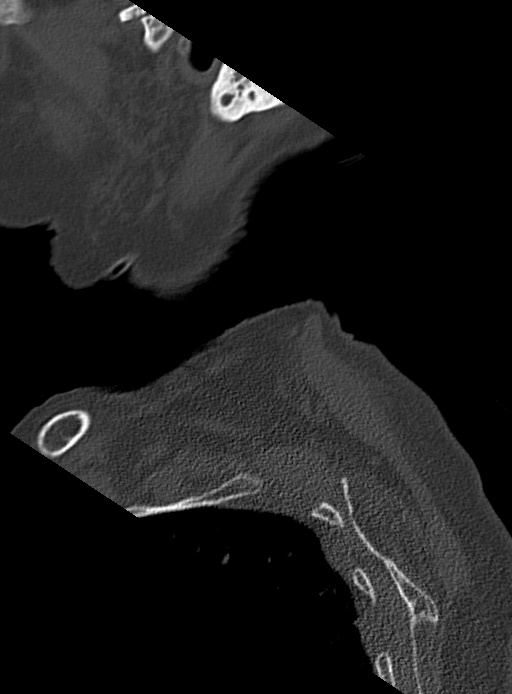
[im 20/70  bone]
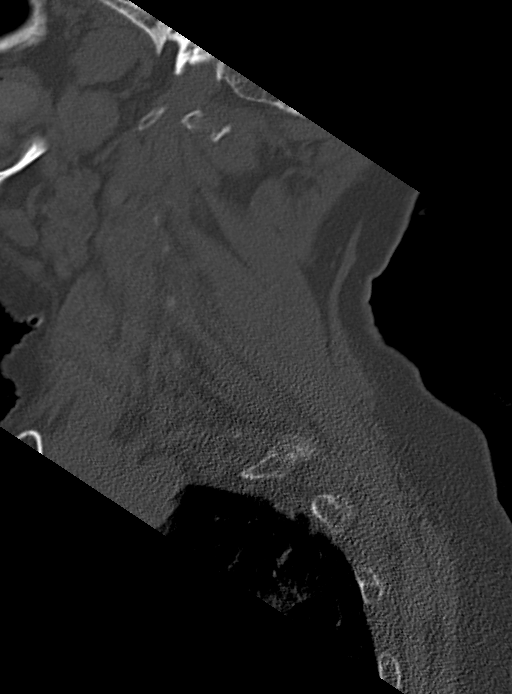
[im 30/70  bone]
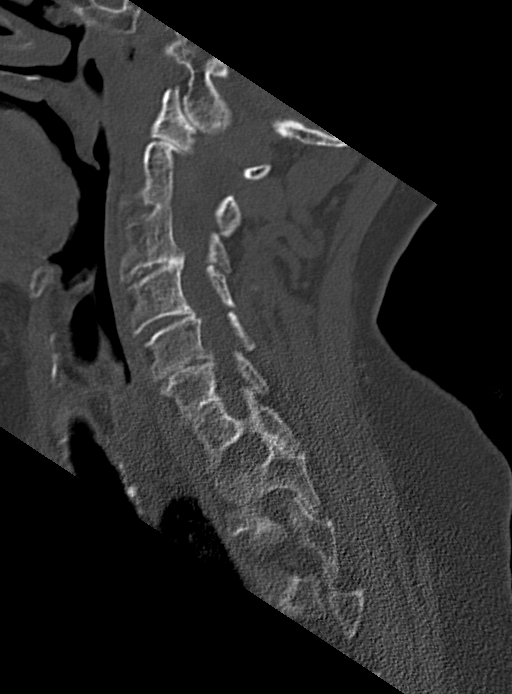
[im 40/70  bone]
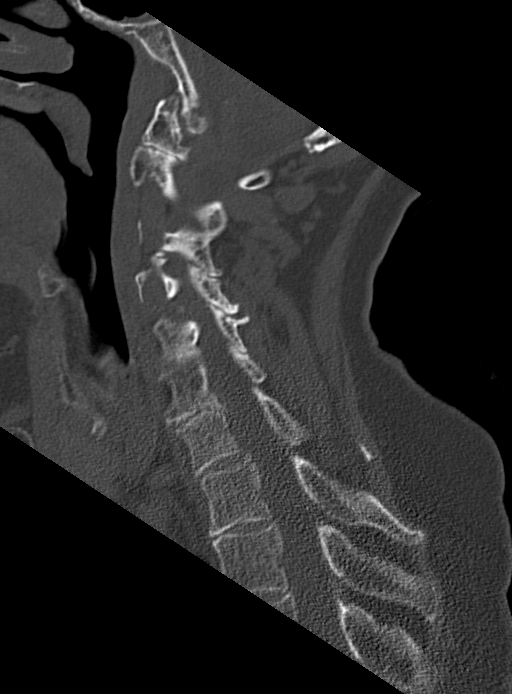
[im 50/70  bone]
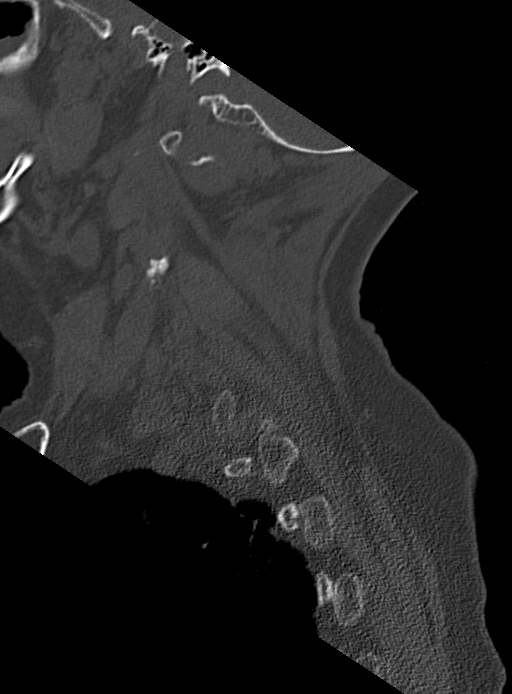

[Series 8: c spine soft · axial · 0.28mm/px · z∈[-261,-203]mm · 3 of 59 slices shown, 4 images]
[im 15/59  soft-tissue]
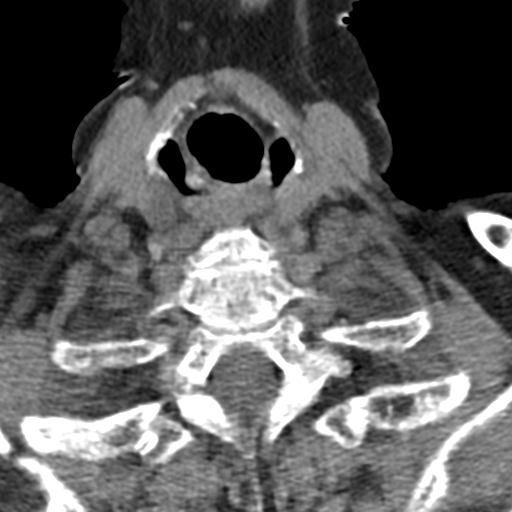
[im 15/59  bone]
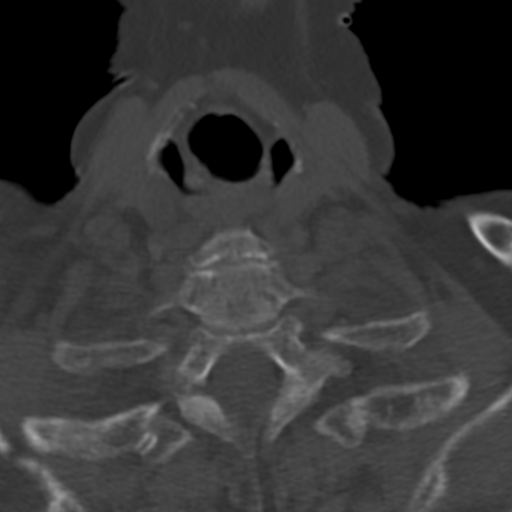
[im 30/59  bone]
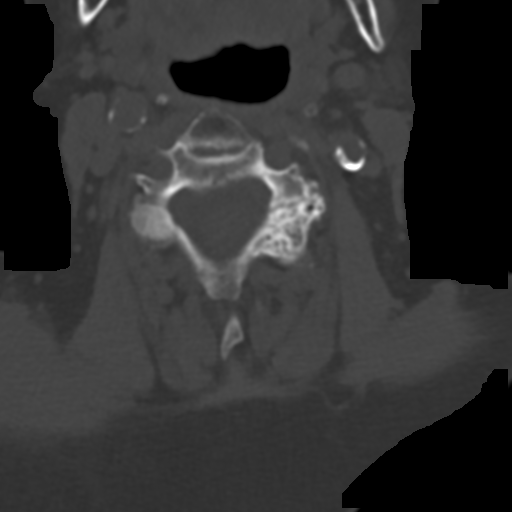
[im 44/59  bone]
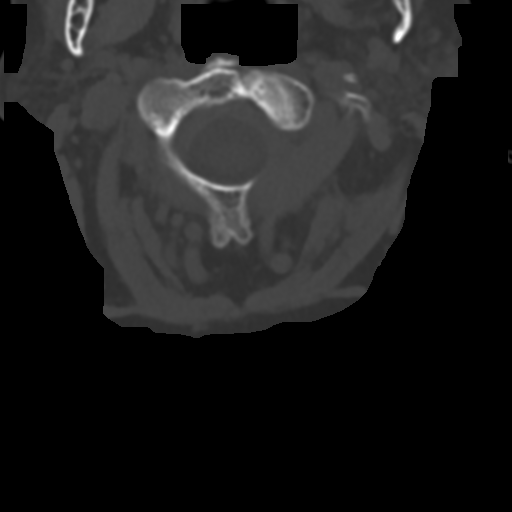

[15 of 33 positions shown; findings below may reference images not displayed]

FINDINGS: CT HEAD FINDINGS

Brain: No evidence of acute infarction, hemorrhage, hydrocephalus,
extra-axial collection or mass lesion/mass effect.

Mild cortical atrophy.

Subcortical white matter and periventricular small vessel ischemic
changes.

Vascular: Intracranial atherosclerosis.

Skull: Normal. Negative for fracture or focal lesion.

Sinuses/Orbits: Partial opacification of the bilateral ethmoid and
sphenoid sinuses. Opacification of the right mastoid air cells.

Other: Mild soft tissue swelling/hematoma overlying the right
posterior vertex.

CT CERVICAL SPINE FINDINGS

Alignment: Normal cervical lordosis.

Skull base and vertebrae: No acute fracture. No primary bone lesion
or focal pathologic process.

Soft tissues and spinal canal: No prevertebral fluid or swelling. No
visible canal hematoma.

Disc levels:  Mild degenerative changes of the mid cervical spine.

Spinal canal is patent.

Upper chest: Visualized lung apices are notable for biapical
pleural-parenchymal scarring.

Other: Visualized thyroid is unremarkable.
IMPRESSION: Mild soft tissue swelling/hematoma overlying the right posterior
vertex. No evidence of calvarial fracture.

No evidence of acute intracranial abnormality. Atrophy with small
vessel ischemic changes. Opacification of the right mastoid air
cells.

No evidence of traumatic injury to the cervical spine.
